# Patient Record
Sex: Female | Born: 1975 | Hispanic: No | Marital: Married | Smoking: Current every day smoker
Health system: Southern US, Community
[De-identification: ages and names within clinical notes are randomized; demographics above are authoritative.]

## PROBLEM LIST (undated history)

## (undated) DIAGNOSIS — J45909 Unspecified asthma, uncomplicated: Secondary | ICD-10-CM

## (undated) DIAGNOSIS — R0600 Dyspnea, unspecified: Secondary | ICD-10-CM

## (undated) DIAGNOSIS — J449 Chronic obstructive pulmonary disease, unspecified: Secondary | ICD-10-CM

## (undated) DIAGNOSIS — R519 Headache, unspecified: Secondary | ICD-10-CM

## (undated) DIAGNOSIS — E079 Disorder of thyroid, unspecified: Secondary | ICD-10-CM

## (undated) DIAGNOSIS — Z800 Family history of malignant neoplasm of digestive organs: Secondary | ICD-10-CM

## (undated) DIAGNOSIS — K219 Gastro-esophageal reflux disease without esophagitis: Secondary | ICD-10-CM

## (undated) DIAGNOSIS — Z803 Family history of malignant neoplasm of breast: Secondary | ICD-10-CM

## (undated) DIAGNOSIS — Z8041 Family history of malignant neoplasm of ovary: Secondary | ICD-10-CM

## (undated) HISTORY — DX: Disorder of thyroid, unspecified: E07.9

## (undated) HISTORY — DX: Family history of malignant neoplasm of breast: Z80.3

## (undated) HISTORY — DX: Family history of malignant neoplasm of ovary: Z80.41

## (undated) HISTORY — DX: Family history of malignant neoplasm of digestive organs: Z80.0

---
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
## 1999-12-27 ENCOUNTER — Inpatient Hospital Stay (HOSPITAL_COMMUNITY): Admission: AD | Admit: 1999-12-27 | Discharge: 1999-12-27 | Payer: Self-pay | Admitting: *Deleted

## 1999-12-28 ENCOUNTER — Encounter: Payer: Self-pay | Admitting: *Deleted

## 2000-05-15 ENCOUNTER — Emergency Department (HOSPITAL_COMMUNITY): Admission: EM | Admit: 2000-05-15 | Discharge: 2000-05-15 | Payer: Self-pay | Admitting: Emergency Medicine

## 2000-10-08 ENCOUNTER — Emergency Department (HOSPITAL_COMMUNITY): Admission: EM | Admit: 2000-10-08 | Discharge: 2000-10-08 | Payer: Self-pay | Admitting: Emergency Medicine

## 2001-02-23 ENCOUNTER — Emergency Department (HOSPITAL_COMMUNITY): Admission: EM | Admit: 2001-02-23 | Discharge: 2001-02-23 | Payer: Self-pay | Admitting: Emergency Medicine

## 2001-02-23 ENCOUNTER — Encounter: Payer: Self-pay | Admitting: Emergency Medicine

## 2002-01-10 ENCOUNTER — Ambulatory Visit (HOSPITAL_COMMUNITY): Admission: RE | Admit: 2002-01-10 | Discharge: 2002-01-10 | Payer: Self-pay | Admitting: *Deleted

## 2002-02-04 ENCOUNTER — Inpatient Hospital Stay (HOSPITAL_COMMUNITY): Admission: AD | Admit: 2002-02-04 | Discharge: 2002-02-04 | Payer: Self-pay | Admitting: *Deleted

## 2002-02-19 ENCOUNTER — Ambulatory Visit (HOSPITAL_COMMUNITY): Admission: RE | Admit: 2002-02-19 | Discharge: 2002-02-19 | Payer: Self-pay | Admitting: *Deleted

## 2002-03-12 ENCOUNTER — Inpatient Hospital Stay (HOSPITAL_COMMUNITY): Admission: AD | Admit: 2002-03-12 | Discharge: 2002-03-12 | Payer: Self-pay | Admitting: Obstetrics and Gynecology

## 2002-03-14 ENCOUNTER — Inpatient Hospital Stay (HOSPITAL_COMMUNITY): Admission: AD | Admit: 2002-03-14 | Discharge: 2002-03-14 | Payer: Self-pay | Admitting: *Deleted

## 2002-04-16 ENCOUNTER — Inpatient Hospital Stay (HOSPITAL_COMMUNITY): Admission: AD | Admit: 2002-04-16 | Discharge: 2002-04-16 | Payer: Self-pay | Admitting: Obstetrics and Gynecology

## 2002-04-18 ENCOUNTER — Inpatient Hospital Stay (HOSPITAL_COMMUNITY): Admission: AD | Admit: 2002-04-18 | Discharge: 2002-04-21 | Payer: Self-pay | Admitting: *Deleted

## 2003-06-08 ENCOUNTER — Encounter: Payer: Self-pay | Admitting: Emergency Medicine

## 2003-06-08 ENCOUNTER — Emergency Department (HOSPITAL_COMMUNITY): Admission: AD | Admit: 2003-06-08 | Discharge: 2003-06-08 | Payer: Self-pay | Admitting: Emergency Medicine

## 2007-01-28 ENCOUNTER — Ambulatory Visit (HOSPITAL_COMMUNITY): Admission: RE | Admit: 2007-01-28 | Discharge: 2007-01-28 | Payer: Self-pay | Admitting: Obstetrics & Gynecology

## 2007-02-15 ENCOUNTER — Ambulatory Visit (HOSPITAL_COMMUNITY): Admission: RE | Admit: 2007-02-15 | Discharge: 2007-02-15 | Payer: Self-pay | Admitting: Obstetrics & Gynecology

## 2007-05-27 ENCOUNTER — Inpatient Hospital Stay (HOSPITAL_COMMUNITY): Admission: AD | Admit: 2007-05-27 | Discharge: 2007-05-27 | Payer: Self-pay | Admitting: Gynecology

## 2007-06-19 ENCOUNTER — Inpatient Hospital Stay (HOSPITAL_COMMUNITY): Admission: AD | Admit: 2007-06-19 | Discharge: 2007-06-22 | Payer: Self-pay | Admitting: Family Medicine

## 2007-06-19 ENCOUNTER — Ambulatory Visit: Payer: Self-pay | Admitting: Obstetrics and Gynecology

## 2007-06-20 ENCOUNTER — Encounter (INDEPENDENT_AMBULATORY_CARE_PROVIDER_SITE_OTHER): Payer: Self-pay | Admitting: Gynecology

## 2009-02-06 IMAGING — US US OB FOLLOW-UP
1 series · 14 of 18 positions shown · non-contrast
Comparison: none

OBSTETRICAL ULTRASOUND:

 This ultrasound exam was performed in the [HOSPITAL] Ultrasound Department.  The OB US report was generated in the AS system, and faxed to the ordering physician.  This report is also available in [REDACTED] PACS.

[Series 1: us ob re-eval · 14 of 18 slices shown]
[im 1/18]
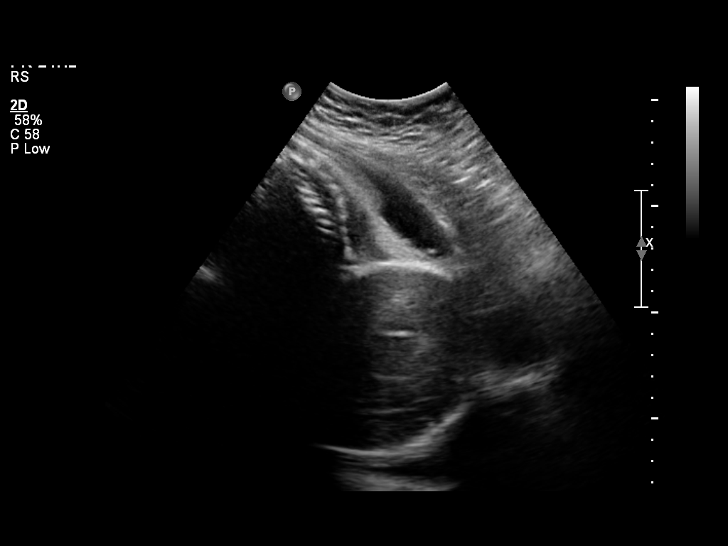
[im 2/18]
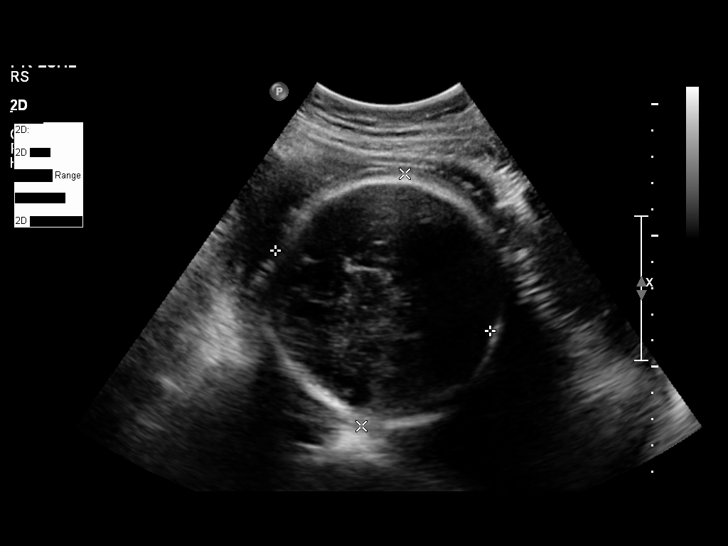
[im 4/18]
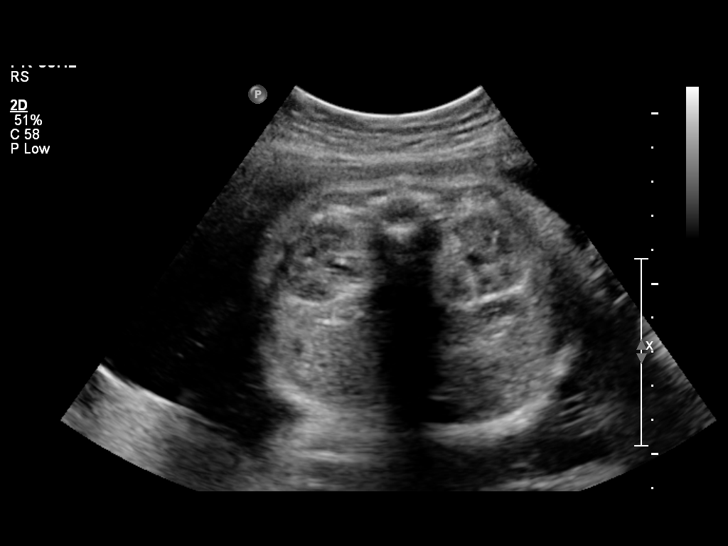
[im 5/18]
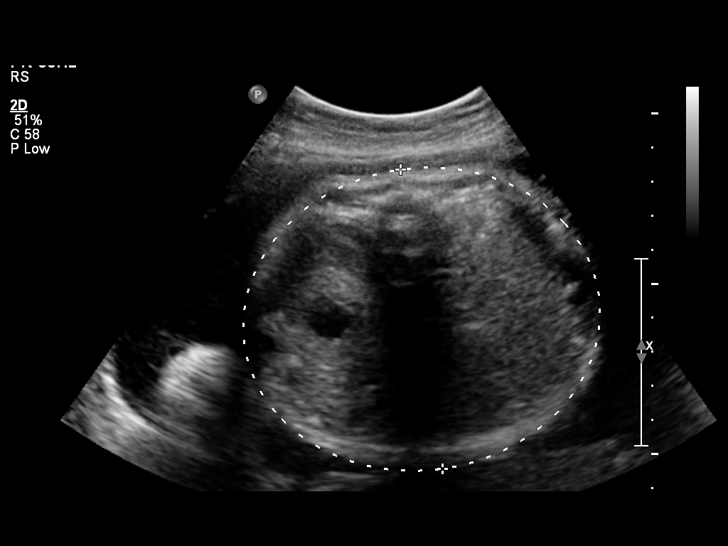
[im 6/18]
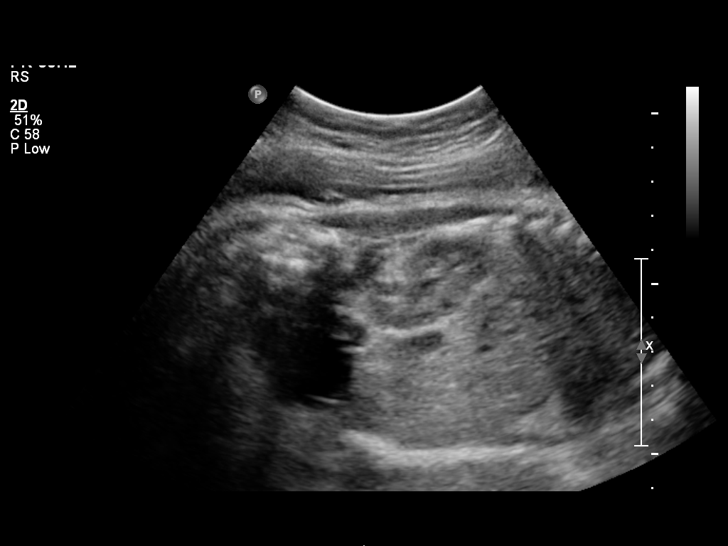
[im 8/18]
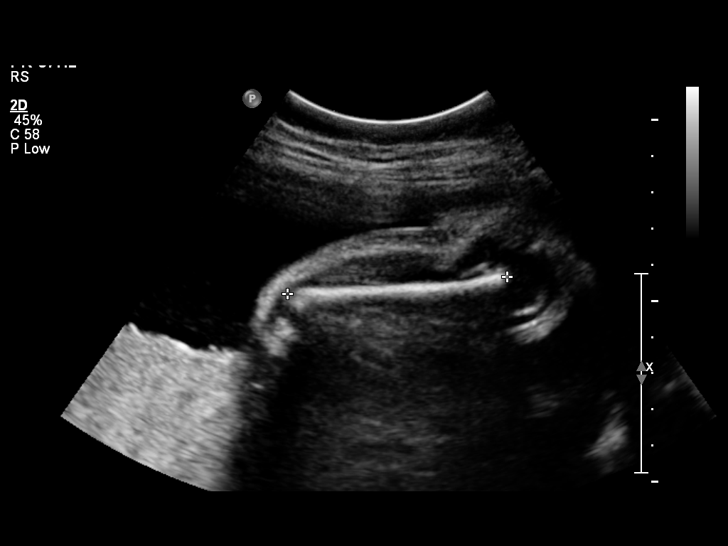
[im 9/18]
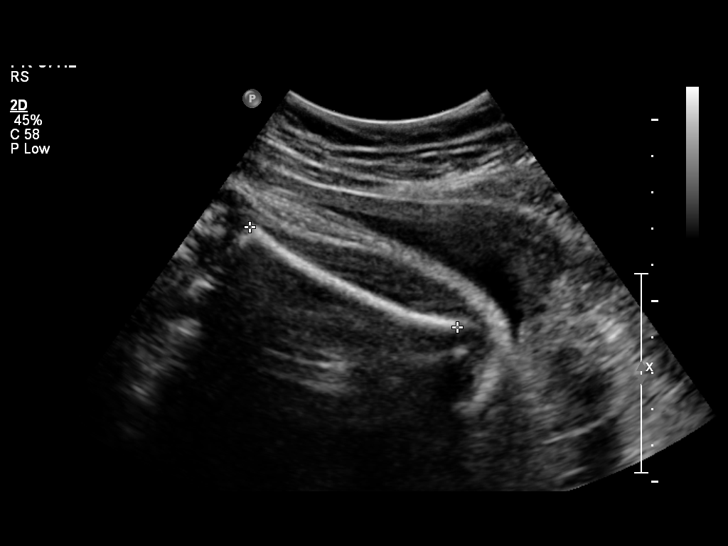
[im 10/18]
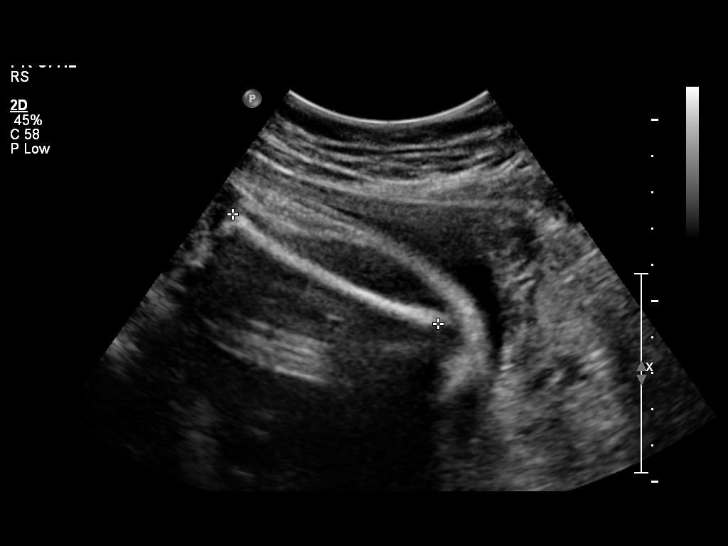
[im 11/18]
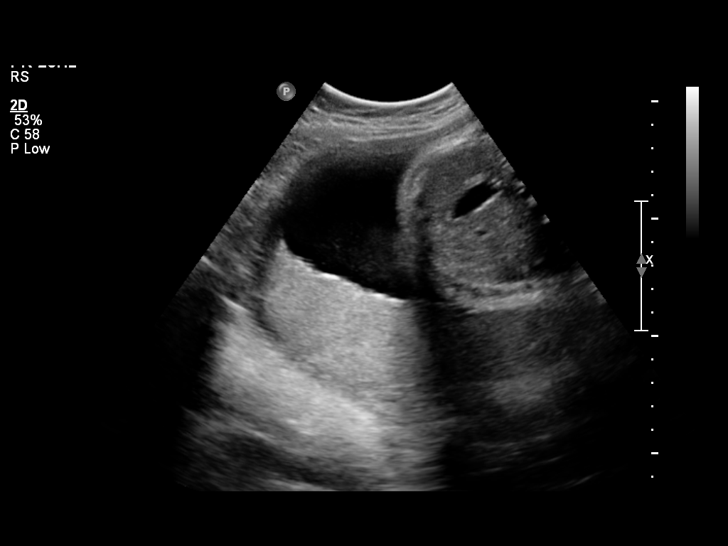
[im 13/18]
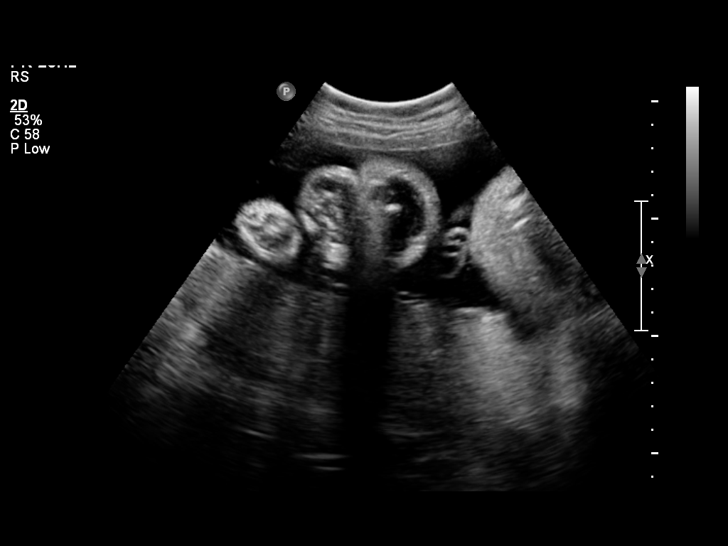
[im 14/18]
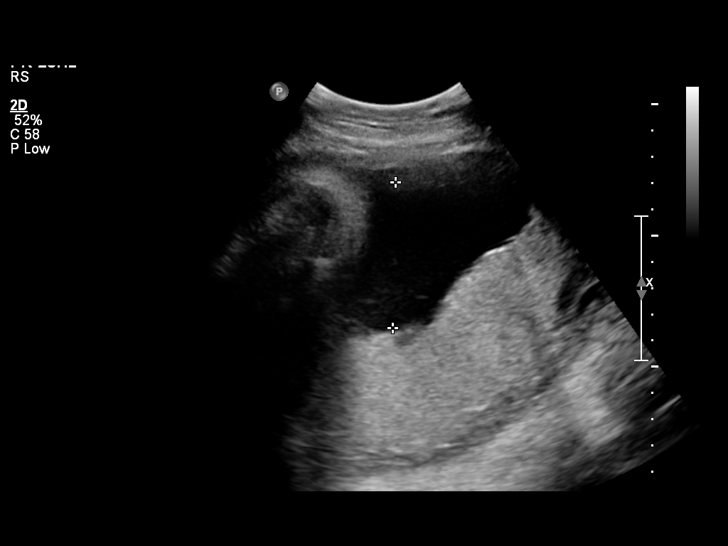
[im 15/18]
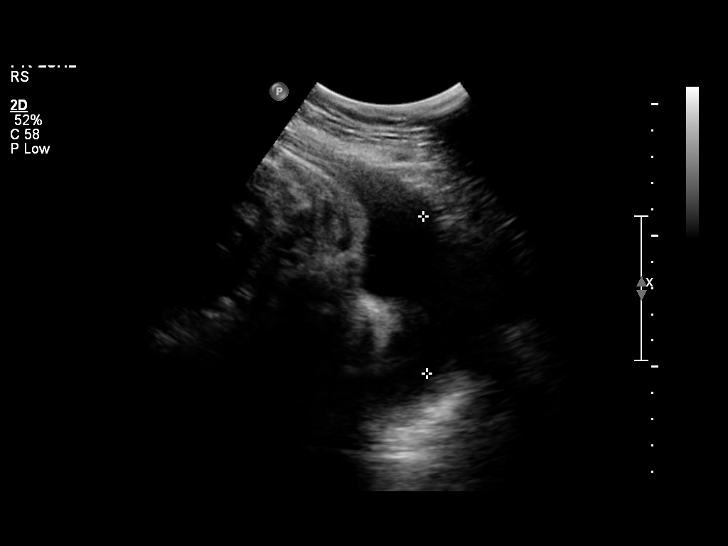
[im 17/18]
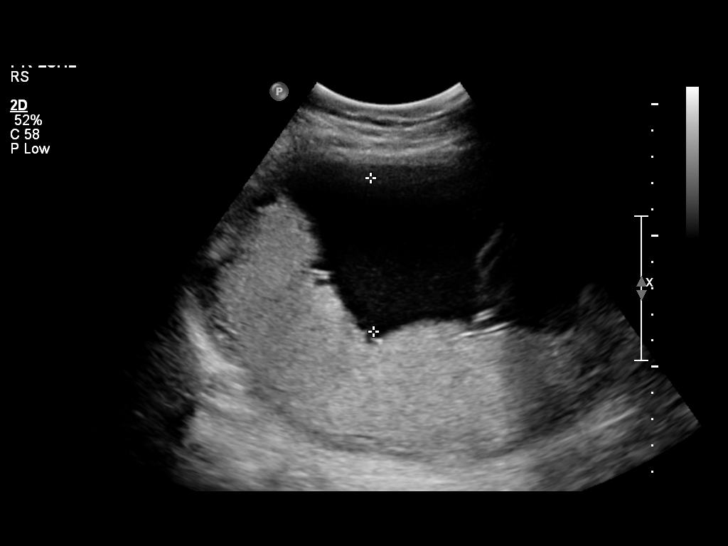
[im 18/18]
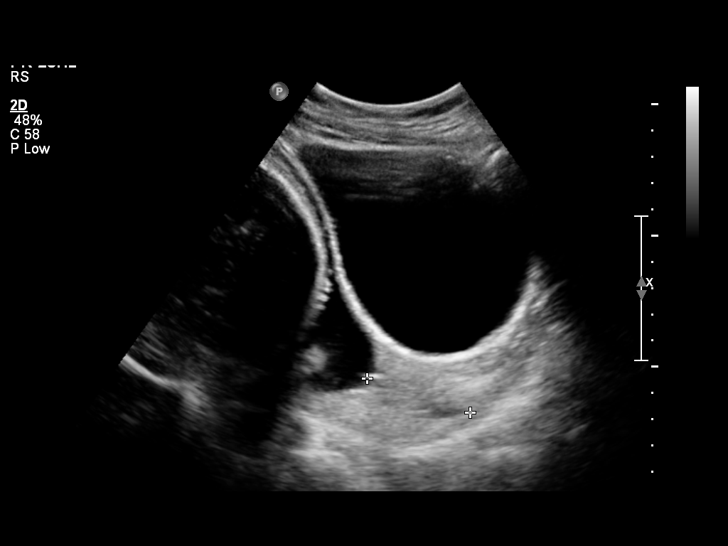

[14 of 18 positions shown; findings below may reference images not displayed]

IMPRESSION: See AS Obstetric US report.

## 2011-05-02 NOTE — Op Note (Signed)
Lindsay Harris, Lindsay Harris              ACCOUNT NO.:  [id number]   MEDICAL RECORD NO.:  [id number]          PATIENT TYPE:  INP   LOCATION:  9173                          FACILITY:  [facility]   PHYSICIAN:  Ginger Carne, MD  DATE OF BIRTH:  [birth year]   DATE OF PROCEDURE:  06/20/2007  DATE OF DISCHARGE:                               OPERATIVE REPORT   PREOPERATIVE DIAGNOSIS:  Request for sterilization postpartum.   POSTOPERATIVE DIAGNOSIS:  Request for sterilization postpartum.   PROCEDURE:  [place] bilateral tubal ligation.   SURGEON:  Ginger Carne, M.D.   ASSISTANT:  None.   COMPLICATIONS:  None immediate.   ESTIMATED BLOOD LOSS:  Minimal.   ANESTHESIA:  Should be epidural.   OPERATIVE FINDINGS:  Uterus, tubes and ovaries showed normal decidual  changes of pregnancy.  Both tubes were identified separate apart from  their respective round ligaments from their isthmus to fimbriated ends.  Both ovaries appeared normal.   OPERATIVE PROCEDURE:  The patient prepped and draped in usual fashion  and placed in the supine position.  Betadine solution was used for  antiseptic and the patient voided immediately prior to the procedure.  After adequate epidural analgesia a small vertical infraumbilical  incision was made and the abdomen opened. As described above both tubes  were identified. At the isthmus ampullary junction 3 cm of tube were  incorporated with 2-0 plain sutures twice, tubes cut above knots.  Specimens sent separately to pathology.  Tips cauterized and no active  bleeding noted. Closure of the fascia in one layer with 0 Vicryl running  suture and 3-0 Monocryl subcuticular closure.  Instrument and sponge  count were correct.  The patient tolerated procedure well, returned to  the post anesthesia recovery room in excellent condition.      Ginger Carne, MD  Electronically Signed     SHB/MEDQ  D:  06/20/2007  T:  06/20/2007  Job:  [id number]

## 2011-10-03 LAB — CBC
HCT: 30.2 — ABNORMAL LOW
MCHC: 33.8
MCV: 96.4
Platelets: 256
Platelets: 311
RBC: 3.75 — ABNORMAL LOW
RDW: 12.7
RDW: 13.1
WBC: 12.4 — ABNORMAL HIGH

## 2011-10-03 LAB — RPR: RPR Ser Ql: NONREACTIVE

## 2011-10-05 LAB — WET PREP, GENITAL: Yeast Wet Prep HPF POC: NONE SEEN

## 2011-10-05 LAB — BASIC METABOLIC PANEL
Calcium: 8.8
Chloride: 107
Creatinine, Ser: 0.49
GFR calc Af Amer: >60
Sodium: 136

## 2011-10-05 LAB — CULTURE, BETA STREP (GROUP B ONLY)

## 2011-10-05 LAB — GC/CHLAMYDIA PROBE AMP, GENITAL: Chlamydia, DNA Probe: NEGATIVE

## 2014-03-30 ENCOUNTER — Emergency Department (HOSPITAL_COMMUNITY)
Admission: EM | Admit: 2014-03-30 | Discharge: 2014-03-30 | Disposition: A | Discharge status: Home / Self Care | Payer: Self-pay | Attending: Emergency Medicine | Admitting: Emergency Medicine

## 2014-03-30 ENCOUNTER — Encounter (HOSPITAL_COMMUNITY): Payer: Self-pay | Admitting: Emergency Medicine

## 2014-03-30 DIAGNOSIS — T148XXA Other injury of unspecified body region, initial encounter: Secondary | ICD-10-CM

## 2014-03-30 DIAGNOSIS — G8929 Other chronic pain: Secondary | ICD-10-CM | POA: Insufficient documentation

## 2014-03-30 DIAGNOSIS — S139XXA Sprain of joints and ligaments of unspecified parts of neck, initial encounter: Secondary | ICD-10-CM | POA: Insufficient documentation

## 2014-03-30 DIAGNOSIS — X500XXA Overexertion from strenuous movement or load, initial encounter: Secondary | ICD-10-CM | POA: Insufficient documentation

## 2014-03-30 DIAGNOSIS — M542 Cervicalgia: Secondary | ICD-10-CM

## 2014-03-30 DIAGNOSIS — Y9389 Activity, other specified: Secondary | ICD-10-CM | POA: Insufficient documentation

## 2014-03-30 DIAGNOSIS — Y929 Unspecified place or not applicable: Secondary | ICD-10-CM | POA: Insufficient documentation

## 2014-03-30 DIAGNOSIS — R209 Unspecified disturbances of skin sensation: Secondary | ICD-10-CM | POA: Insufficient documentation

## 2014-03-30 DIAGNOSIS — Z880 Allergy status to penicillin: Secondary | ICD-10-CM | POA: Insufficient documentation

## 2014-03-30 DIAGNOSIS — Z87891 Personal history of nicotine dependence: Secondary | ICD-10-CM | POA: Insufficient documentation

## 2014-03-30 MED ORDER — DIAZEPAM 5 MG PO TABS
5.0000 mg | ORAL_TABLET | Freq: Once | ORAL | Status: AC
  Administered 2014-03-30: 5 mg via ORAL
  Filled 2014-03-30: qty 1

## 2014-03-30 MED ORDER — DIAZEPAM 5 MG PO TABS: 5.0000 mg | ORAL_TABLET | Freq: Three times a day (TID) | ORAL | Status: DC | PRN

## 2014-03-30 NOTE — ED Notes (Signed)
Pt c/o right sided neck and shoulder pain into upper back after moving heavy furniture; pt sts chronic issues in that area

## 2014-03-30 NOTE — ED Notes (Signed)
Hx of chronic neck pain. Moved furniture this weekend. Now has right neck pain, trapezius pain. States has right arm weakness and right hand numbness.

## 2014-03-30 NOTE — ED Provider Notes (Signed)
CSN: [id number]     Arrival date & time 03/30/14  1312 History  This chart was scribed for non-physician practitioner, Trixie DredgeEmily Diamon Reddinger, PA-C working with Gerhard Munchobert Lockwood, MD by Greggory StallionKayla Andersen, ED scribe. This patient was seen in room TR04C/TR04C and the patient's care was started at 2:50 PM.   Chief Complaint  Patient presents with  . Neck Pain   The history is provided by the patient. No language interpreter was used.   HPI Comments: Lindsay Harris is a 38 y.o. female with history of chronic neck pain who presents to the Emergency Department complaining of sudden onset, constant right sided neck pain that radiates into her shoulder and upper back that started a few days ago. Pt states she was moving heavy furniture and felt a pull and a pop in her neck. Turning her neck to the left worsens the pain. She also has numbness in her right arm. Pt takes eight Advil per day with no relief.   History reviewed. No pertinent past medical history. History reviewed. No pertinent past surgical history. History reviewed. No pertinent family history. History  Substance Use Topics  . Smoking status: Former Games developermoker  . Smokeless tobacco: Not on file  . Alcohol Use: No   OB History   Grav Para Term Preterm Abortions TAB SAB Ect Mult Living                 Review of Systems  Musculoskeletal: Positive for arthralgias, back pain and neck pain.  Neurological: Positive for numbness.  All other systems reviewed and are negative.  Allergies  Amoxicillin  Home Medications  No current outpatient prescriptions on file.  BP 95/55  Pulse 103  Temp(Src) 97.8 F (36.6 C) (Oral)  Resp 20  SpO2 100%  Physical Exam  Nursing note and vitals reviewed. Constitutional: She appears well-developed and well-nourished. No distress.  HENT:  Head: Normocephalic and atraumatic.  Neck: Neck supple.  Pulmonary/Chest: Effort normal.  Musculoskeletal:  Spine nontender, no crepitus, or stepoffs. Right paraspinal  tenderness and right trapezius tenderness and spasm.  Upper extremities:  Strength 5/5, sensation intact, distal pulses intact. Full active ROM of shoulder and elbow.   Neurological: She is alert.  Skin: She is not diaphoretic.    ED Course  Procedures (including critical care time)  DIAGNOSTIC STUDIES: Oxygen Saturation is 100% on RA, normal by my interpretation.    COORDINATION OF CARE: 2:53 PM-Discussed treatment plan which includes a muscle relaxer and continuing ibuprofen with pt at bedside and pt agreed to plan.   Labs Review Labs Reviewed - No data to display Imaging Review No results found.   EKG Interpretation None      MDM   Final diagnoses:  Neck pain on right side  Muscle strain    Afebrile nontoxic patient with acute on chronic neck pain with recent hx heavy lifting.  Neurovascularly intact.  Tenderness over musculature which appears to be in spasm/tight.  Likely muscle strain. D/C home with instructions for cold compresses/ice, valium. PCP follow up.  Discussed findings, treatment, and follow up  with patient.  Pt given return precautions.  Pt verbalizes understanding and agrees with plan.      I personally performed the services described in this documentation, which was scribed in my presence. The recorded information has been reviewed and is accurate.  Trixie Dredgemily Tishawn Friedhoff, PA-C 03/30/14 1625

## 2014-03-30 NOTE — Discharge Instructions (Signed)
Read the information below.  Use the prescribed medication as directed.  Please discuss all new medications with your pharmacist.  You may return to the Emergency Department at any time for worsening condition or any new symptoms that concern you.  If there is any possibility that you might be pregnant, please let your health care provider know and discuss this with the pharmacist to ensure medication safety.   If you develop fevers, loss of control of bowel or bladder, persistent weakness or numbness in your arms or legs, or are unable to walk or hold on to objects, return to the ER for a recheck.    Muscle Strain A muscle strain is an injury that occurs when a muscle is stretched beyond its normal length. Usually a small number of muscle fibers are torn when this happens. Muscle strain is rated in degrees. First-degree strains have the least amount of muscle fiber tearing and pain. Second-degree and third-degree strains have increasingly more tearing and pain.  Usually, recovery from muscle strain takes 1 2 weeks. Complete healing takes 5 6 weeks.  CAUSES  Muscle strain happens when a sudden, violent force placed on a muscle stretches it too far. This may occur with lifting, sports, or a fall.  RISK FACTORS Muscle strain is especially common in athletes.  SIGNS AND SYMPTOMS At the site of the muscle strain, there may be:  Pain.  Bruising.  Swelling.  Difficulty using the muscle due to pain or lack of normal function. DIAGNOSIS  Your health care provider will perform a physical exam and ask about your medical history. TREATMENT  Often, the best treatment for a muscle strain is resting, icing, and applying cold compresses to the injured area.  HOME CARE INSTRUCTIONS   Use the PRICE method of treatment to promote muscle healing during the first 2 3 days after your injury. The PRICE method involves:  Protecting the muscle from being injured again.  Restricting your activity and resting  the injured body part.  Icing your injury. To do this, put ice in a plastic bag. Place a towel between your skin and the bag. Then, apply the ice and leave it on from 15 20 minutes each hour. After the third day, switch to moist heat packs.  Apply compression to the injured area with a splint or elastic bandage. Be careful not to wrap it too tightly. This may interfere with blood circulation or increase swelling.  Elevate the injured body part above the level of your heart as often as you can.  Only take over-the-counter or prescription medicines for pain, discomfort, or fever as directed by your health care provider.  Warming up prior to exercise helps to prevent future muscle strains. SEEK MEDICAL CARE IF:   You have increasing pain or swelling in the injured area.  You have numbness, tingling, or a significant loss of strength in the injured area. MAKE SURE YOU:   Understand these instructions.  Will watch your condition.  Will get help right away if you are not doing well or get worse. Document Released: 12/04/2005 Document Revised: 09/24/2013 Document Reviewed: 07/03/2013 [facility] Patient Information 2014 [place], [place].   Emergency Department Resource Guide 1) Find a Doctor and Pay Out of Pocket Although you won't have to find out who is covered by your insurance plan, it is a good idea to ask around and get recommendations. You will then need to call the office and see if the doctor you have chosen will accept you as  a new patient and what types of options they offer for patients who are self-pay. Some doctors offer discounts or will set up payment plans for their patients who do not have insurance, but you will need to ask so you aren't surprised when you get to your appointment.  2) Contact Your Local Health Department Not all health departments have doctors that can see patients for sick visits, but many do, so it is worth a call to see if yours does. If you don't know  where your local health department is, you can check in your phone book. The CDC also has a tool to help you locate your state's health department, and many state websites also have listings of all of their local health departments.  3) Find a Walk-in Clinic If your illness is not likely to be very severe or complicated, you may want to try a walk in clinic. These are popping up all over the country in pharmacies, drugstores, and shopping centers. They're usually staffed by nurse practitioners or physician assistants that have been trained to treat common illnesses and complaints. They're usually fairly quick and inexpensive. However, if you have serious medical issues or chronic medical problems, these are probably not your best option.  No Primary Care Doctor: - Call Health Connect at  [phone] - they can help you locate a primary care doctor that  accepts your insurance, provides certain services, etc. - Physician Referral Service- [phone]  Chronic Pain Problems: Organization         Address  Phone   Notes  [facility]  [phone]) [phone] Patients need to be referred by their primary care doctor.   Medication Assistance: Organization         Address  Phone   Notes  [facility] Medication [facility] Ptrssistance Program [address] [place], [place] [address] [phone]) [phone] --Must be a resident of [facility] [place] -- Must have NO insurance coverage whatsoever (no Medicaid/ Medicare, etc.) -- The pt. MUST have a primary care doctor that directs their care regularly and follows them in the community   MedAssist  [phone]   [employer]  [phone]) [phone]    Agencies that provide inexpensive medical care: Organization         Address  Phone   Notes  [facility]  [phone]   [facility]    [phone]   [facility] [address] [place], [place] [address] [phone]) [phone]   [facility]  [address], [place] [phone]   [facility]anned Parenthood    [phone](336) [phone]   [facility]    [phone]   [facility] and [facility]  [address], [place] Phone:  [phone]) [phone], Fax:  [phone](336) [phone] Hours of Operation:  9 am - 6 pm, M-F.  Also accepts Medicaid/Medicare and self-pay.  [facility]  [address],  Phone: [phone], Fax: [phone]. Hours of Operation:  8:30 am - 5:30 pm, M-F.  Also accepts Medicaid and self-pay.  [facility] [place] [address], [place] Phone: [phone]) [phone]   [facility] [address], [place], [place] [phone], Ext. [phone] Mondays & Thursdays: 7-9 AM.  First 15 patients are seen on a first come, first serve basis.    Medicaid-accepting [facility] Providers:  Organization  Address  Phone   Notes  [facility] [address], [place] [phone] Also accepts self-pay patients.  [facility] [address], [place]  [phone]) [phone]   [facility] [address], [place] [phone]   [facility] [address] [place] [address], [place] [phone](336) [phone]   Renaye RakersVeita Bland [address], [place]   [phone] Only accepts [place] Access [place]icaid patients after they have their name applied to their card.   Self-Pay (no insurance) in [facility] [place]:  Organization         Address  Phone   Notes  Sickle Cell Patients, [facility] [address], [place] [phone]) [phone]   [facility] [address], [place] [phone]   [facility] [place]  [address], [place] [phone]   [facility]/Dr. Osei-Bonsu  [address] [place] [address], [place] or [id number], [place] [phone] Phone number for both  [place] and [place] locations is the same.  [facility] [address], [place] [phone]   [facility] [place] [address] [place] [address], [place] or [address] [phone]) [phone]   [facility] [address], [place]ony [place] [phone]) [phone], phone; [phone]) [phone], fax Sees patients 1st and 3rd Saturday of every month.  Must not qualify for public or private insurance (i.e. Medicaid, Medicare, [place] Health Choice, Veterans' Benefits)  Household income should be no more than 200% of the poverty level The clinic cannot treat you if you are pregnant or think you are pregnant  Sexually transmitted diseases are not treated at the clinic.    Dental Care: Organization         Address  Phone  Notes  [facility] Department of [facility] [address], [place] [phone]) [phone] Accepts children up to age 37 who are enrolled in [place]icaid or [place] Health Choice; pregnant women with a Medicaid card; and children who have applied for Medicaid or [place] Health Choice, but were declined, whose parents can pay a reduced fee at time of service.  [facility] of [facility] [place]  [address], [place] [phone]) [phone] Accepts children up to age 37 who are enrolled in [place]icaid or [place] Health Choice; pregnant women with a Medicaid card; and children who have applied for Medicaid or [place] Health Choice, but were declined, whose parents can pay a reduced fee at time of service.  [facility]  [address], [place] [phone] Patients are seen by appointment only. Walk-ins are not accepted. [facility] will see patients 37 years of age and older. Monday - Tuesday (8am-5pm) Most Wednesdays (8:30-5pm) $30 per visit, cash only  [facility]  [address], [facility] [place] [facility] [phone]) [phone] Patients are seen by appointment only. Walk-ins are not  accepted. [facility] will see patients 37 years of age and older. One Wednesday Evening (Monthly: Volunteer Based).  $30 per visit, cash only  [employer] [facility] [facility]  [phone]) [phone] for adults; Children under age 214, call Graduate Pediatric Dentistry at [phone]. Children aged 714-14, please call [phone]) [phone] to request a pediatric application.  Dental services are provided in all areas of dental care including  fillings, crowns and bridges, complete and partial dentures, implants, gum treatment, root canals, and extractions. Preventive care is also provided. Treatment is provided to both adults and children. Patients are selected via a lottery and there is often a waiting list.   [facility] [address], [place]  [phone]) [phone] [email or web address]   [facility] [address], [place], [place] [phone], Ext. [phone] Second and Fourth Thursday of each month, opens at 6:30 AM; Clinic ends at 9 AM.  Patients are seen on a first-come first-served basis, and a limited number are seen during each clinic.   [facility]  [address], [place], [place] [phone]) [phone]   Eligibility Requirements You must have lived in [place], [place], or [place] counties for at least the last three months.   You cannot be eligible for state or federal sponsored National Cityhealthcare insurance, including CIGNAVeterans Administration, [place]icaid, or Har[employer] EntertainmentMedicare.   You generally cannot be eligible for healthcare insurance through your employer.    How to apply: Eligibility screenings are held every Tuesday and Wednesday afternoon from 1:00 pm until 4:00 pm. You do not need an appointment for the interview!  [facility] [address], [place], [place] [phone]   [facility]  [phone]   [facility]  [phone]   [facility]  [phone]    Behavioral Health Resources in the  Community: Intensive Outpatient Programs Organization         Address  Phone  Notes  [facility] [address], [place], [place] [phone]   [facility] [address], [place], [place] [phone]   [facility] S[facility] [address], [place], [place]  [phone]   [facility] [address],  [place], [place] [phone] or [phone]   Substance Abuse Resources Organization         Address  Phone  Notes  [facility] Drug Services  [phone]   [facility]  [phone]   [facility]  [phone]   [facility]  [phone]   Residential & Outpatient Substance Abuse Program  [phone]   Psychological Services Organization         Address  Phone  Notes  [facility]  [phone]   [facility]  [phone]- [phone]   [facility] [address], [place] [phone] or [phone]    Mobile Crisis Teams Organization         Address  Phone  Notes  [facility], Mobile Crisis Care Unit  [phone]   [facility]  [address] [place], [place] [phone]   Doristine LocksSharon DeEsch [address] [place] [phone]    Self-Help/Support Groups Organization         Address  Phone             Notes  [facility] Assoc[facility] of [place] - variety of support groups  336- [phone] Call for more information  Narcotics Anonymous (NA), [facility]aring [facility] [address], [place]  2 meetings at this location   Statisticianesidential Treatment Programs Organization         Address  Phone  Notes  [facility] [address],    [place]  [phone]   [facility]  [address], [place] [id number], [place], [place] [phone]   [facility] [address], [place] [phone] Admissions: 8am-3pm M-F  [facility] [address],    [place], [place] [phone]   [facility] [address] [place], [place], [place] [phone]   [facility] [address],  [place], [place] [phone]   [facility] - Intensive Outpatient [address], [place], [place] [phone]   [facility] ([facility] [address]  [place], [place] [phone] or [phone]   [facility] (RTS) [address] [place], [place] [phone] Accepts Medicaid  [facility]olson [address],  [place] [phone] Substance Abuse/Addiction Treatment   [facility] Organization         Address  Phone  Notes  [facility]  [phone]   Angie Fava, PhD [address] Knack [place], [place]   [phone] or [phone]   [facility]   [address] [place], [place] [phone]   [facility] [address], [place], [place] [phone] Insurance/Medicaid/sponsorship through [facility] [place] [facility] [address]                                    [place], [place] [phone] Therapy/tele-psych/case  [facility] [address], [place] [phone]    Dr. Lolly Mustache  [phone]   [facility] [place]  [employer] [facility] Dept. 1) [address] [place] 2) [address] [place] 3)  [address], [place] [phone]  [phone]   [facility] Child Abuse Hotline (830)[phone] or [phone] (After Hours)

## 2014-04-02 NOTE — ED Provider Notes (Signed)
Medical screening examination/treatment/procedure(s) were performed by non-physician practitioner and as supervising physician I was immediately available for consultation/collaboration.  Kevionna Heffler, MD 04/02/14 0721 

## 2015-12-30 ENCOUNTER — Emergency Department (HOSPITAL_COMMUNITY)
Admission: EM | Admit: 2015-12-30 | Discharge: 2015-12-31 | Disposition: A | Discharge status: Home / Self Care | Payer: Self-pay | Attending: Emergency Medicine | Admitting: Emergency Medicine

## 2015-12-30 ENCOUNTER — Encounter (HOSPITAL_COMMUNITY): Payer: Self-pay | Admitting: Emergency Medicine

## 2015-12-30 DIAGNOSIS — L03211 Cellulitis of face: Secondary | ICD-10-CM | POA: Insufficient documentation

## 2015-12-30 DIAGNOSIS — K047 Periapical abscess without sinus: Secondary | ICD-10-CM | POA: Insufficient documentation

## 2015-12-30 DIAGNOSIS — L0291 Cutaneous abscess, unspecified: Secondary | ICD-10-CM

## 2015-12-30 DIAGNOSIS — Z3202 Encounter for pregnancy test, result negative: Secondary | ICD-10-CM | POA: Insufficient documentation

## 2015-12-30 DIAGNOSIS — Z880 Allergy status to penicillin: Secondary | ICD-10-CM | POA: Insufficient documentation

## 2015-12-30 DIAGNOSIS — F17210 Nicotine dependence, cigarettes, uncomplicated: Secondary | ICD-10-CM | POA: Insufficient documentation

## 2015-12-30 LAB — COMPREHENSIVE METABOLIC PANEL
ALT: 13 U/L — ABNORMAL LOW (ref 14–54)
ANION GAP: 11 (ref 5–15)
AST: 18 U/L (ref 15–41)
Albumin: 3.8 g/dL (ref 3.5–5.0)
Alkaline Phosphatase: 66 U/L (ref 38–126)
BILIRUBIN TOTAL: 0.6 mg/dL (ref 0.3–1.2)
BUN: <5 mg/dL — ABNORMAL LOW (ref 6–20)
CO2: 25 mmol/L (ref 22–32)
Calcium: 9.3 mg/dL (ref 8.9–10.3)
Chloride: 103 mmol/L (ref 101–111)
Creatinine, Ser: 0.84 mg/dL (ref 0.44–1.00)
GFR calc Af Amer: >60 mL/min (ref >60)
Glucose, Bld: 114 mg/dL — ABNORMAL HIGH (ref 65–99)
POTASSIUM: 3.6 mmol/L (ref 3.5–5.1)
Sodium: 139 mmol/L (ref 135–145)
TOTAL PROTEIN: 7.5 g/dL (ref 6.5–8.1)

## 2015-12-30 LAB — CBC
HEMATOCRIT: 39.5 % (ref 36.0–46.0)
HEMOGLOBIN: 13.6 g/dL (ref 12.0–15.0)
MCH: 33 pg (ref 26.0–34.0)
MCHC: 34.4 g/dL (ref 30.0–36.0)
MCV: 95.9 fL (ref 78.0–100.0)
Platelets: 229 10*3/uL (ref 150–400)
RBC: 4.12 MIL/uL (ref 3.87–5.11)
RDW: 12.5 % (ref 11.5–15.5)
WBC: 15.3 10*3/uL — AB (ref 4.0–10.5)

## 2015-12-30 LAB — I-STAT CG4 LACTIC ACID, ED
LACTIC ACID, VENOUS: 0.33 mmol/L — AB (ref 0.5–2.0)
Lactic Acid, Venous: 1.33 mmol/L (ref 0.5–2.0)

## 2015-12-30 MED ORDER — MORPHINE SULFATE (PF) 4 MG/ML IV SOLN
4.0000 mg | Freq: Once | INTRAVENOUS | Status: AC
  Administered 2015-12-30: 4 mg via INTRAVENOUS
  Filled 2015-12-30: qty 1

## 2015-12-30 NOTE — ED Notes (Signed)
ED PA at the bedside 

## 2015-12-30 NOTE — ED Provider Notes (Signed)
CSN: [id number]     Arrival date & time 12/30/15  1735 History   First MD Initiated Contact with Patient 12/30/15 2224     Chief Complaint  Patient presents with  . Facial Swelling     (Consider location/radiation/quality/duration/timing/severity/associated sxs/prior Treatment) The history is provided by the patient and medical records.    40 year old female presenting to the ED for left-sided facial swelling. Patient states this was sudden onset upon waking this morning. She states initially she had mild swelling beneath her left eye, then it has migrated to the left cheek and across to the right side of her face.  Patient states she had a low-grade fever earlier today. She denies any chills. She states her face is very painful. She denies any dental pain. Patient states she saw she may have had an early dental abscess, however she called her dentist and he felt this was less likely so he encouraged her to come to the emergency department.  Patient has no known allergies. No new foods, medications, soaps, detergents, makeup, or other personal care products. She denies any difficulty swallowing. No chest pain or shortness of breath.  History reviewed. No pertinent past medical history. History reviewed. No pertinent past surgical history. No family history on file. Social History  Substance Use Topics  . Smoking status: Current Every Day Smoker -- 0.50 packs/day    Types: Cigarettes  . Smokeless tobacco: None  . Alcohol Use: No   OB History    No data available     Review of Systems  HENT: Positive for facial swelling.   All other systems reviewed and are negative.     Allergies  Amoxicillin  Home Medications   Prior to Admission medications   Medication Sig Start Date End Date Taking? Authorizing Provider  Acetaminophen-Caff-Pyrilamine (MIDOL COMPLETE PO) Take 2 tablets by mouth daily as needed (for menstrual cramping).    Historical Provider, MD  diazepam (VALIUM) 5 MG  tablet Take 1 tablet (5 mg total) by mouth every 8 (eight) hours as needed (muscle spasm or pain). 03/30/14   Trixie Dredge, PA-C   BP 115/67 mmHg  Pulse 94  Temp(Src) 98.2 F (36.8 C) (Oral)  Resp 18  Ht 5\' 2"  (1.575 m)  Wt 60.328 kg  BMI 24.32 kg/m2  SpO2 99%  LMP 12/27/2015   Physical Exam  Constitutional: She is oriented to person, place, and time. She appears well-developed and well-nourished. No distress.  HENT:  Head: Normocephalic and atraumatic.    Mouth/Throat: Oropharynx is clear and moist.  Left facial swelling as depicted; swelling begins just inferior to left eye and extends across bridge of nose towards right cheek; no directed eyelid involvement; no skin discoloration, rash, open wounds, or obvious induration/cellulitis  Teeth largely in good dentition, left upper molars are non-tender, no significant decay or fractured teeth, surrounding gingiva normal in appearance without swelling, discoloration, or tenderness, handling secretions appropriately, no trismus  Eyes: Conjunctivae and EOM are normal. Pupils are equal, round, and reactive to light.  Neck: Normal range of motion. Neck supple.  Cardiovascular: Normal rate, regular rhythm and normal heart sounds.   Pulmonary/Chest: Effort normal and breath sounds normal. No respiratory distress. She has no wheezes.  Abdominal: Soft. Bowel sounds are normal. There is no tenderness. There is no guarding.  Musculoskeletal: Normal range of motion.  Neurological: She is alert and oriented to person, place, and time.  Skin: Skin is warm and dry. She is not diaphoretic.  Psychiatric: She has  a normal mood and affect.  Nursing note and vitals reviewed.   ED Course  Procedures (including critical care time) Labs Review Labs Reviewed  COMPREHENSIVE METABOLIC PANEL - Abnormal; Notable for the following:    Glucose, Bld 114 (*)    BUN <5 (*)    ALT 13 (*)    All other components within normal limits  CBC - Abnormal; Notable for  the following:    WBC 15.3 (*)    All other components within normal limits  I-STAT CG4 LACTIC ACID, ED - Abnormal; Notable for the following:    Lactic Acid, Venous 0.33 (*)    All other components within normal limits  I-STAT CG4 LACTIC ACID, ED  POC URINE PREG, ED    Imaging Review Ct Maxillofacial W/cm  12/31/2015  CLINICAL DATA:  Left facial swelling.  No dental pain. EXAM: CT MAXILLOFACIAL WITH CONTRAST TECHNIQUE: Multidetector CT imaging of the maxillofacial structures was performed with intravenous contrast. Multiplanar CT image reconstructions were also generated. A small metallic BB was placed on the right temple in order to reliably differentiate right from left. CONTRAST:  80mL OMNIPAQUE IOHEXOL 300 MG/ML  SOLN COMPARISON:  None. FINDINGS: Left facial soft tissue edema and mild skin thickening. There is a small crescentic fluid collection about the left alveolar ridge measuring 2 x 12 mm. No soft tissue air. No subjacent bony abnormality. Paranasal sinuses are well-aerated. No mucosal thickening or fluid level. Mastoid air cells are clear. There is no facial bone fracture. Orbits and globes appear normal, no retrobulbar edema. There are broken teeth and dental caries, no periapical lucencies. Lip ring is noted. IMPRESSION: Left facial soft tissue edema, may reflect cellulitis. Small question take 2 x 12 mm fluid collection about the left alveolar ridge may reflect small abscess. Electronically Signed   By: Rubye OaksMelanie  Ehinger M.D.   On: 12/31/2015 02:13   I have personally reviewed and evaluated these images and lab results as part of my medical decision-making.   EKG Interpretation None      MDM   Final diagnoses:  Facial cellulitis  Abscess   40 year old female here with left facial swelling. Patient is afebrile, nontoxic. She does have some swelling inferior to left eye but is not extending to mouth. She has no overlying skin changes visible. No rashes or lesions. She has no  dental pain, teeth are largely in very good dentition.  Lab work does reveal leukocytosis. CT of her face was obtained which reveals possible cellulitis and questionable abscess of the left alveolar ridge. Results were discussed with patient, she acknowledged understanding. Will start on clindamycin given her penicillin allergy. She will follow-up with her dentist as soon as possible for recheck.  Discussed plan with patient, he/she acknowledged understanding and agreed with plan of care.  Return precautions given for new or worsening symptoms.  Case discussed with attending physician, Dr. Nicanor AlconPalumbo, who reviewed CT scan and agrees with assessment and plan of care.  Garlon HatchetLisa M Sanders, PA-C 12/31/15 [phone]  April Palumbo, MD 12/31/15 [phone]

## 2015-12-30 NOTE — ED Notes (Signed)
Pt states she woke up this monring and had some swelling on the left side of her nose and has spread up her face under eye and around nose.

## 2015-12-31 ENCOUNTER — Emergency Department (HOSPITAL_COMMUNITY): Payer: Self-pay

## 2015-12-31 LAB — POC URINE PREG, ED: Preg Test, Ur: NEGATIVE

## 2015-12-31 MED ORDER — CLINDAMYCIN HCL 150 MG PO CAPS: 300.0000 mg | ORAL_CAPSULE | Freq: Three times a day (TID) | ORAL | Status: DC

## 2015-12-31 MED ORDER — IOHEXOL 300 MG/ML  SOLN
80.0000 mL | Freq: Once | INTRAMUSCULAR | Status: AC | PRN
  Administered 2015-12-31: 80 mL via INTRAVENOUS

## 2015-12-31 MED ORDER — HYDROCODONE-ACETAMINOPHEN 5-325 MG PO TABS: 1.0000 | ORAL_TABLET | ORAL | Status: DC | PRN

## 2015-12-31 NOTE — ED Notes (Signed)
Patient transported to CT 

## 2015-12-31 NOTE — Discharge Instructions (Signed)
Take the prescribed medication as directed.  vicodin can make you sleepy/drowsy so use caution. Follow-up with your dentist as soon as possible. Return to the ED for new or worsening symptoms.

## 2018-09-19 ENCOUNTER — Other Ambulatory Visit: Payer: Self-pay

## 2018-09-19 ENCOUNTER — Ambulatory Visit (HOSPITAL_COMMUNITY)
Admission: EM | Admit: 2018-09-19 | Discharge: 2018-09-19 | Disposition: A | Discharge status: Home / Self Care | Payer: Self-pay | Attending: Emergency Medicine | Admitting: Emergency Medicine

## 2018-09-19 DIAGNOSIS — F17210 Nicotine dependence, cigarettes, uncomplicated: Secondary | ICD-10-CM | POA: Insufficient documentation

## 2018-09-19 DIAGNOSIS — M549 Dorsalgia, unspecified: Secondary | ICD-10-CM | POA: Insufficient documentation

## 2018-09-19 DIAGNOSIS — Z880 Allergy status to penicillin: Secondary | ICD-10-CM | POA: Insufficient documentation

## 2018-09-19 DIAGNOSIS — R112 Nausea with vomiting, unspecified: Secondary | ICD-10-CM | POA: Insufficient documentation

## 2018-09-19 DIAGNOSIS — N10 Acute tubulo-interstitial nephritis: Secondary | ICD-10-CM | POA: Insufficient documentation

## 2018-09-19 DIAGNOSIS — Z3202 Encounter for pregnancy test, result negative: Secondary | ICD-10-CM

## 2018-09-19 LAB — POCT URINALYSIS DIP (DEVICE)
Bilirubin Urine: NEGATIVE
Glucose, UA: NEGATIVE mg/dL
Ketones, ur: NEGATIVE mg/dL
NITRITE: POSITIVE — AB
PROTEIN: 100 mg/dL — AB
Specific Gravity, Urine: 1.02 (ref 1.005–1.030)
Urobilinogen, UA: 0.2 mg/dL (ref 0.0–1.0)
pH: 6 (ref 5.0–8.0)

## 2018-09-19 LAB — POCT I-STAT, CHEM 8
BUN: 7 mg/dL (ref 6–20)
CREATININE: 0.8 mg/dL (ref 0.44–1.00)
Calcium, Ion: 1.06 mmol/L — ABNORMAL LOW (ref 1.15–1.40)
Chloride: 103 mmol/L (ref 98–111)
GLUCOSE: 129 mg/dL — AB (ref 70–99)
HCT: 45 % (ref 36.0–46.0)
Hemoglobin: 15.3 g/dL — ABNORMAL HIGH (ref 12.0–15.0)
POTASSIUM: 3.4 mmol/L — AB (ref 3.5–5.1)
Sodium: 135 mmol/L (ref 135–145)
TCO2: 20 mmol/L — ABNORMAL LOW (ref 22–32)

## 2018-09-19 LAB — POCT PREGNANCY, URINE: Preg Test, Ur: NEGATIVE

## 2018-09-19 MED ORDER — KETOROLAC TROMETHAMINE 30 MG/ML IJ SOLN
INTRAMUSCULAR | Status: AC
  Filled 2018-09-19: qty 1

## 2018-09-19 MED ORDER — KETOROLAC TROMETHAMINE 30 MG/ML IJ SOLN
30.0000 mg | Freq: Once | INTRAMUSCULAR | Status: AC
  Administered 2018-09-19: 30 mg via INTRAMUSCULAR

## 2018-09-19 MED ORDER — ONDANSETRON 4 MG PO TBDP: 4.0000 mg | ORAL_TABLET | Freq: Three times a day (TID) | ORAL | 0 refills | Status: DC | PRN

## 2018-09-19 MED ORDER — SODIUM CHLORIDE 0.9 % IV BOLUS
1000.0000 mL | Freq: Once | INTRAVENOUS | Status: AC
  Administered 2018-09-19: 1000 mL via INTRAVENOUS

## 2018-09-19 MED ORDER — SULFAMETHOXAZOLE-TRIMETHOPRIM 800-160 MG PO TABS: 1.0000 | ORAL_TABLET | Freq: Two times a day (BID) | ORAL | 0 refills | Status: AC

## 2018-09-19 MED ORDER — ONDANSETRON HCL 4 MG/2ML IJ SOLN
INTRAMUSCULAR | Status: AC
  Filled 2018-09-19: qty 2

## 2018-09-19 MED ORDER — ONDANSETRON HCL 4 MG/2ML IJ SOLN
4.0000 mg | Freq: Once | INTRAMUSCULAR | Status: AC
  Administered 2018-09-19: 4 mg via INTRAVENOUS

## 2018-09-19 NOTE — ED Provider Notes (Signed)
[facility]    CSN: [id number] Arrival date & time: 09/19/18  0848     History   Chief Complaint Chief Complaint  Patient presents with  . Emesis  . Back Pain    HPI Lindsay Harris is a 42 y.o. female.   Lindsay Harris presents with complaints of vomiting and back pain which started two days ago. Persistent heaving. Abdominal pain and cramping. Mild dizziness. Diarrhea yesterday. No known fevers. No known ill contacts. States she does have some pain after urination as well as left sided back pain. No vaginal symptoms. Denies any previous similar. Has been taking allergy medication but still feels sinus pressure. Has been taking cranberry pills which have not helped. Headache. Smokes less than 1 ppd. Smokes 1gm of marijuana daily. No known questionable/contaminated intake prior to onset of symptoms.    ROS per HPI.      No past medical history on file.  There are no active problems to display for this patient.   No past surgical history on file.  OB History   None      Home Medications    Prior to Admission medications   Medication Sig Start Date End Date Taking? Authorizing Provider  clindamycin (CLEOCIN) 150 MG capsule Take 2 capsules (300 mg total) by mouth 3 (three) times daily. May dispense as 150mg  capsules 12/31/15   Garlon Hatchet, PA-C  diazepam (VALIUM) 5 MG tablet Take 1 tablet (5 mg total) by mouth every 8 (eight) hours as needed (muscle spasm or pain). Patient not taking: Reported on 12/30/2015 03/30/14   Trixie Dredge, PA-C  HYDROcodone-acetaminophen (NORCO/VICODIN) 5-325 MG tablet Take 1 tablet by mouth every 4 (four) hours as needed. Patient not taking: Reported on 09/19/2018 12/31/15   Garlon Hatchet, PA-C  ibuprofen (ADVIL,MOTRIN) 200 MG tablet Take 200 mg by mouth every 6 (six) hours as needed for moderate pain.     [provider]    Family History No family history on file.  Social History Social History   Tobacco Use  .  Smoking status: Current Every Day Smoker    Packs/day: 0.50    Types: Cigarettes  Substance Use Topics  . Alcohol use: No  . Drug use: No     Allergies   Amoxicillin   Review of Systems Review of Systems   Physical Exam Triage Vital Signs ED Triage Vitals  Enc Vitals Group     BP 09/19/18 0856 (!) 87/41     Pulse Rate 09/19/18 0902 (!) 115     Resp 09/19/18 0856 16     Temp 09/19/18 0856 98.5 F (36.9 C)     Temp src --      SpO2 09/19/18 0856 98 %     Weight 09/19/18 0859 153 lb (69.4 kg)     Height --      Head Circumference --      Peak Flow --      Pain Score --      Pain Loc --      Pain Edu? --      Excl. in GC? --    No data found.  Updated Vital Signs BP (!) 89/58 (BP Location: Left Arm)   Pulse 98   Temp 98 F (36.7 C) (Oral)   Resp 20   Wt 153 lb (69.4 kg)   LMP 08/29/2018   SpO2 97%   BMI 27.98 kg/m    Physical Exam  Constitutional: She is oriented to  person, place, and time. She appears well-developed and well-nourished. No distress.  Cardiovascular: Regular rhythm and normal heart sounds. Tachycardia present.  Pulmonary/Chest: Effort normal and breath sounds normal.  Abdominal: Soft. Bowel sounds are decreased. There is tenderness. There is CVA tenderness. There is no rigidity, no rebound, no guarding, no tenderness at McBurney's point and negative Murphy's sign.  Constant dry heaving on initial exam; improved with IV zofran; generalized abdominal pain on palpation, upper abdomen greatest; left cva tenderness on percussion   Neurological: She is alert and oriented to person, place, and time.  Skin: Skin is warm and dry.     UC Treatments / Results  Labs (all labs ordered are listed, but only abnormal results are displayed) Labs Reviewed  POCT I-STAT, CHEM 8 - Abnormal; Notable for the following components:      Result Value   Potassium 3.4 (*)    Glucose, Bld 129 (*)    Calcium, Ion 1.06 (*)    TCO2 20 (*)    Hemoglobin 15.3 (*)      All other components within normal limits  URINE CULTURE  POCT PREGNANCY, URINE    EKG None  Radiology No results found.  Procedures Procedures (including critical care time)  Medications Ordered in UC Medications  ondansetron (ZOFRAN) injection 4 mg (4 mg Intravenous Given 09/19/18 0917)  sodium chloride 0.9 % bolus 1,000 mL (0 mLs Intravenous Stopped 09/19/18 1026)  ketorolac (TORADOL) 30 MG/ML injection 30 mg (30 mg Intramuscular Given 09/19/18 0920)    Initial Impression / Assessment and Plan / UC Course  I have reviewed the triage vital signs and the nursing notes.  Pertinent labs & imaging results that were available during my care of the patient were reviewed by me and considered in my medical decision making (see chart for details).     2130: heaving constantly on initial presentation. Abdominal cramping without specific pain at this time, afebrile. Will start fluids and zofran and reassess.   0945: no longer heaving, nausea improved. Still with abdominal discomfort. Exam completed, unable to urinate at this time.   1035: patient feels much improved. No longer vomiting. Pain improved. Sitting upright, appears much more comfortable    Urine, history and physical consistent with pyelonephritis. Anaphylaxis to amoxicillin therefore unable to provide rocephin in clinic. zofran PRN and course of bactrim provided. Culture pending. BP remains lower here today, but per history review patient runs in low 100's. HR improved with fluids. Now able to tolerate PO as well. Encouraged to push fluids. Return precautions provided. Patient verbalized understanding and agreeable to plan.   Final Clinical Impressions(s) / UC Diagnoses   Final diagnoses:  Acute pyelonephritis  Non-intractable vomiting with nausea, unspecified vomiting type     Discharge Instructions     Drink plenty of water to empty bladder regularly. Please increase your fluid intake today as your blood pressure  remains on the lower end of normal for you.  Bland diet as tolerated.  Avoid alcohol and caffeine as these may irritate the bladder.   Complete course of antibiotics.  Zofran as needed for nausea.  If develop worsening of symptoms, increased pain, uncontrolled nausea/vomiting, dehydration, no urine output in 8-10 hours, fevers, or otherwise worsening please go to the Er.     ED Prescriptions    None     Controlled Substance Prescriptions  Controlled Substance Registry consulted? Not Applicable   Georgetta Haber, NP 09/19/18 1053

## 2018-09-19 NOTE — Discharge Instructions (Addendum)
Drink plenty of water to empty bladder regularly. Please increase your fluid intake today as your blood pressure remains on the lower end of normal for you.  Bland diet as tolerated.  Avoid alcohol and caffeine as these may irritate the bladder.   Complete course of antibiotics.  Zofran as needed for nausea.  If develop worsening of symptoms, increased pain, uncontrolled nausea/vomiting, dehydration, no urine output in 8-10 hours, fevers, or otherwise worsening please go to the Er.

## 2018-09-19 NOTE — ED Triage Notes (Signed)
Pt states she's having back pain and vomiting 2 days.

## 2018-09-21 LAB — URINE CULTURE: Culture: >=100000 — AB

## 2018-09-23 ENCOUNTER — Telehealth (HOSPITAL_COMMUNITY): Payer: Self-pay

## 2018-09-23 NOTE — Telephone Encounter (Signed)
Urine culture positive for E.coli. This was treated with Bactrim. Attempted to reach patient. No answer at this time

## 2019-07-14 ENCOUNTER — Ambulatory Visit (HOSPITAL_COMMUNITY)
Admission: EM | Admit: 2019-07-14 | Discharge: 2019-07-14 | Disposition: A | Discharge status: Home / Self Care | Payer: HRSA Program | Attending: Emergency Medicine | Admitting: Emergency Medicine

## 2019-07-14 ENCOUNTER — Other Ambulatory Visit: Payer: Self-pay

## 2019-07-14 DIAGNOSIS — Z20828 Contact with and (suspected) exposure to other viral communicable diseases: Secondary | ICD-10-CM | POA: Diagnosis not present

## 2019-07-14 DIAGNOSIS — F17210 Nicotine dependence, cigarettes, uncomplicated: Secondary | ICD-10-CM | POA: Diagnosis not present

## 2019-07-14 DIAGNOSIS — Z20822 Contact with and (suspected) exposure to covid-19: Secondary | ICD-10-CM

## 2019-07-14 DIAGNOSIS — Z880 Allergy status to penicillin: Secondary | ICD-10-CM | POA: Diagnosis not present

## 2019-07-14 NOTE — ED Triage Notes (Signed)
Pt here requesting covid testing after possibly being exposed at her job; pt sts some sore throat

## 2019-07-14 NOTE — ED Provider Notes (Signed)
[facility]    CSN: [id number] Arrival date & time: 07/14/19  1555     History   Chief Complaint Chief Complaint  Patient presents with  . covid testing    HPI Lindsay Harris is a 43 y.o. female.   Patient request COVID testing.  She states she was in contact with a COVID positive customer who was coughing and symptomatic.  She denies fever, chills, cough, shortness of breath, nausea, vomiting, diarrhea, or other symptoms.  She states she was sent here by her work to obtain a COVID test.    The history is provided by the patient.    No past medical history on file.  There are no active problems to display for this patient.   No past surgical history on file.  OB History   No obstetric history on file.      Home Medications    Prior to Admission medications   Medication Sig Start Date End Date Taking? Authorizing Provider  clindamycin (CLEOCIN) 150 MG capsule Take 2 capsules (300 mg total) by mouth 3 (three) times daily. May dispense as 150mg  capsules 12/31/15   Larene Pickett, PA-C  diazepam (VALIUM) 5 MG tablet Take 1 tablet (5 mg total) by mouth every 8 (eight) hours as needed (muscle spasm or pain). Patient not taking: Reported on 12/30/2015 03/30/14   Clayton Bibles, PA-C  HYDROcodone-acetaminophen (NORCO/VICODIN) 5-325 MG tablet Take 1 tablet by mouth every 4 (four) hours as needed. Patient not taking: Reported on 09/19/2018 12/31/15   Larene Pickett, PA-C  ibuprofen (ADVIL,MOTRIN) 200 MG tablet Take 200 mg by mouth every 6 (six) hours as needed for moderate pain.     [provider]  ondansetron (ZOFRAN-ODT) 4 MG disintegrating tablet Take 1 tablet (4 mg total) by mouth every 8 (eight) hours as needed for nausea or vomiting. 09/19/18   Zigmund Gottron, NP    Family History No family history on file.  Social History Social History   Tobacco Use  . Smoking status: Current Every Day Smoker    Packs/day: 0.50    Types: Cigarettes  Substance  Use Topics  . Alcohol use: No  . Drug use: No     Allergies   Amoxicillin   Review of Systems Review of Systems  Constitutional: Negative for chills and fever.  HENT: Negative for ear pain and sore throat.   Eyes: Negative for pain and visual disturbance.  Respiratory: Negative for cough and shortness of breath.   Cardiovascular: Negative for chest pain and palpitations.  Gastrointestinal: Negative for abdominal pain, diarrhea and vomiting.  Genitourinary: Negative for dysuria and hematuria.  Musculoskeletal: Negative for arthralgias and back pain.  Skin: Negative for color change and rash.  Neurological: Negative for seizures and syncope.  All other systems reviewed and are negative.    Physical Exam Triage Vital Signs ED Triage Vitals  Enc Vitals Group     BP 07/14/19 1622 (!) 122/50     Pulse Rate 07/14/19 1622 82     Resp 07/14/19 1622 18     Temp 07/14/19 1622 98.4 F (36.9 C)     Temp Source 07/14/19 1622 Oral     SpO2 07/14/19 1622 95 %     Weight --      Height --      Head Circumference --      Peak Flow --      Pain Score 07/14/19 1623 2     Pain Loc --  Pain Edu? --      Excl. in GC? --    No data found.  Updated Vital Signs BP (!) 122/50 (BP Location: Right Arm)   Pulse 82   Temp 98.4 F (36.9 C) (Oral)   Resp 18   SpO2 95%   Visual Acuity Right Eye Distance:   Left Eye Distance:   Bilateral Distance:    Right Eye Near:   Left Eye Near:    Bilateral Near:     Physical Exam Vitals signs and nursing note reviewed.  Constitutional:      General: She is not in acute distress.    Appearance: She is well-developed.  HENT:     Head: Normocephalic and atraumatic.     Right Ear: Tympanic membrane normal.     Left Ear: Tympanic membrane normal.     Mouth/Throat:     Mouth: Mucous membranes are moist.     Pharynx: Oropharynx is clear.  Eyes:     Conjunctiva/sclera: Conjunctivae normal.  Neck:     Musculoskeletal: Neck supple.   Cardiovascular:     Rate and Rhythm: Normal rate and regular rhythm.  Pulmonary:     Effort: Pulmonary effort is normal. No respiratory distress.     Breath sounds: Normal breath sounds.  Abdominal:     Palpations: Abdomen is soft.     Tenderness: There is no abdominal tenderness. There is no guarding or rebound.  Skin:    General: Skin is warm and dry.     Findings: No rash.  Neurological:     Mental Status: She is alert.      UC Treatments / Results  Labs (all labs ordered are listed, but only abnormal results are displayed) Labs Reviewed - No data to display  EKG   Radiology No results found.  Procedures Procedures (including critical care time)  Medications Ordered in UC Medications - No data to display  Initial Impression / Assessment and Plan / UC Course  I have reviewed the triage vital signs and the nursing notes.  Pertinent labs & imaging results that were available during my care of the patient were reviewed by me and considered in my medical decision making (see chart for details).   Suspected COVID.  COVID test performed here and pending.  Instructed patient to self quarantine until her test result is back and is negative.  Discussed that she should go to the emergency department if she develops difficulty breathing, high fever, or diarrhea.     Final Clinical Impressions(s) / UC Diagnoses   Final diagnoses:  Suspected Covid-19 Virus Infection     Discharge Instructions     Your COVID test is pending.  You should self quarantine until your test result is back and is negative.        ED Prescriptions    None     Controlled Substance Prescriptions [place] Controlled Substance Registry consulted? Not Applicable   Mickie Bailate, Desarae Placide H, NP 07/14/19 1651

## 2019-07-14 NOTE — Discharge Instructions (Signed)
Your COVID test is pending.  You should self quarantine until your test result is back and is negative.

## 2019-07-16 LAB — NOVEL CORONAVIRUS, NAA (HOSP ORDER, SEND-OUT TO REF LAB; TAT 18-24 HRS): SARS-CoV-2, NAA: NOT DETECTED

## 2019-07-17 ENCOUNTER — Encounter (HOSPITAL_COMMUNITY): Payer: Self-pay

## 2022-03-04 DIAGNOSIS — N3281 Overactive bladder: Secondary | ICD-10-CM | POA: Insufficient documentation

## 2022-08-23 ENCOUNTER — Emergency Department: Payer: Medicaid Other

## 2022-08-23 ENCOUNTER — Emergency Department
Admission: EM | Admit: 2022-08-23 | Discharge: 2022-08-23 | Disposition: A | Discharge status: Home / Self Care | Payer: Medicaid Other | Attending: Emergency Medicine | Admitting: Emergency Medicine

## 2022-08-23 ENCOUNTER — Encounter: Payer: Self-pay | Admitting: Medical Oncology

## 2022-08-23 DIAGNOSIS — H60501 Unspecified acute noninfective otitis externa, right ear: Secondary | ICD-10-CM

## 2022-08-23 DIAGNOSIS — H9201 Otalgia, right ear: Secondary | ICD-10-CM | POA: Diagnosis present

## 2022-08-23 DIAGNOSIS — Z20822 Contact with and (suspected) exposure to covid-19: Secondary | ICD-10-CM | POA: Diagnosis not present

## 2022-08-23 LAB — CBC WITH DIFFERENTIAL/PLATELET
Abs Immature Granulocytes: 0.04 10*3/uL (ref 0.00–0.07)
Basophils Absolute: 0 10*3/uL (ref 0.0–0.1)
Basophils Relative: 0 %
Eosinophils Absolute: 0.1 10*3/uL (ref 0.0–0.5)
Eosinophils Relative: 1 %
HCT: 39.2 % (ref 36.0–46.0)
Hemoglobin: 13.2 g/dL (ref 12.0–15.0)
Immature Granulocytes: 0 %
Lymphocytes Relative: 23 %
Lymphs Abs: 2.4 10*3/uL (ref 0.7–4.0)
MCH: 31.9 pg (ref 26.0–34.0)
MCHC: 33.7 g/dL (ref 30.0–36.0)
MCV: 94.7 fL (ref 80.0–100.0)
Monocytes Absolute: 0.7 10*3/uL (ref 0.1–1.0)
Monocytes Relative: 7 %
Neutro Abs: 7.1 10*3/uL (ref 1.7–7.7)
Neutrophils Relative %: 69 %
Platelets: 244 10*3/uL (ref 150–400)
RBC: 4.14 MIL/uL (ref 3.87–5.11)
RDW: 12.5 % (ref 11.5–15.5)
WBC: 10.3 10*3/uL (ref 4.0–10.5)
nRBC: 0 % (ref 0.0–0.2)

## 2022-08-23 LAB — COMPREHENSIVE METABOLIC PANEL
ALT: 14 U/L (ref 0–44)
AST: 19 U/L (ref 15–41)
Albumin: 3.9 g/dL (ref 3.5–5.0)
Alkaline Phosphatase: 58 U/L (ref 38–126)
Anion gap: 7 (ref 5–15)
BUN: 7 mg/dL (ref 6–20)
CO2: 23 mmol/L (ref 22–32)
Calcium: 8.6 mg/dL — ABNORMAL LOW (ref 8.9–10.3)
Chloride: 107 mmol/L (ref 98–111)
Creatinine, Ser: 0.77 mg/dL (ref 0.44–1.00)
GFR, Estimated: >60 mL/min (ref >60)
Glucose, Bld: 112 mg/dL — ABNORMAL HIGH (ref 70–99)
Potassium: 3.6 mmol/L (ref 3.5–5.1)
Sodium: 137 mmol/L (ref 135–145)
Total Bilirubin: 0.7 mg/dL (ref 0.3–1.2)
Total Protein: 7.1 g/dL (ref 6.5–8.1)

## 2022-08-23 LAB — URINALYSIS, ROUTINE W REFLEX MICROSCOPIC
Bilirubin Urine: NEGATIVE
Glucose, UA: NEGATIVE mg/dL
Ketones, ur: NEGATIVE mg/dL
Leukocytes,Ua: NEGATIVE
Nitrite: NEGATIVE
Protein, ur: NEGATIVE mg/dL
Specific Gravity, Urine: 1.005 (ref 1.005–1.030)
pH: 6 (ref 5.0–8.0)

## 2022-08-23 LAB — SARS CORONAVIRUS 2 BY RT PCR: SARS Coronavirus 2 by RT PCR: NEGATIVE

## 2022-08-23 LAB — LACTIC ACID, PLASMA: Lactic Acid, Venous: 1.4 mmol/L (ref 0.5–1.9)

## 2022-08-23 LAB — POC URINE PREG, ED: Preg Test, Ur: NEGATIVE

## 2022-08-23 MED ORDER — CIPROFLOXACIN HCL 500 MG PO TABS: 500.0000 mg | ORAL_TABLET | Freq: Two times a day (BID) | ORAL | 0 refills | Status: AC

## 2022-08-23 MED ORDER — SODIUM CHLORIDE 0.9 % IV BOLUS
1000.0000 mL | Freq: Once | INTRAVENOUS | Status: AC
  Administered 2022-08-23: 1000 mL via INTRAVENOUS

## 2022-08-23 MED ORDER — OFLOXACIN 0.3 % OT SOLN: 5.0000 [drp] | Freq: Two times a day (BID) | OTIC | 0 refills | Status: AC

## 2022-08-23 MED ORDER — OFLOXACIN 0.3 % OT SOLN: 5.0000 [drp] | Freq: Every day | OTIC | 0 refills | Status: DC

## 2022-08-23 MED ORDER — CIPROFLOXACIN HCL 500 MG PO TABS: 500.0000 mg | ORAL_TABLET | Freq: Two times a day (BID) | ORAL | 0 refills | Status: DC

## 2022-08-23 MED ORDER — IOHEXOL 300 MG/ML  SOLN
75.0000 mL | Freq: Once | INTRAMUSCULAR | Status: AC | PRN
  Administered 2022-08-23: 75 mL via INTRAVENOUS

## 2022-08-23 MED ORDER — HYDROMORPHONE HCL 1 MG/ML IJ SOLN
0.5000 mg | Freq: Once | INTRAMUSCULAR | Status: AC
  Administered 2022-08-23: 0.5 mg via INTRAVENOUS
  Filled 2022-08-23: qty 0.5

## 2022-08-23 MED ORDER — OXYCODONE HCL 5 MG PO TABS: 5.0000 mg | ORAL_TABLET | Freq: Three times a day (TID) | ORAL | 0 refills | Status: AC | PRN

## 2022-08-23 NOTE — ED Triage Notes (Signed)
Pt reports that she has been having rt ear pain x 2 weeks with fever.

## 2022-08-23 NOTE — Discharge Instructions (Addendum)
  You would take the antibiotics and use the drops.  You can just do it in the right ear if you are only having the symptoms in the right ear but if you are having symptoms in the left ear you can use in the left ear as well.  You can call the ENT doctor to make a follow-up appointment and return to the ER if you develop redness or swelling behind the ear worsening pain or any other concerns   IMPRESSION: Right otitis externa without complicating osteomyelitis or abscess. Only mild right middle ear and mastoid opacification.  Take oxycodone as prescribed. Do not drink alcohol, drive or participate in any other potentially dangerous activities while taking this medication as it may make you sleepy. Do not take this medication with any other sedating medications, either prescription or over-the-counter. If you were prescribed Percocet or Vicodin, do not take these with acetaminophen (Tylenol) as it is already contained within these medications.  This medication is an opiate (or narcotic) pain medication and can be habit forming. Use it as little as possible to achieve adequate pain control. Do not use or use it with extreme caution if you have a history of opiate abuse or dependence. If you are on a pain contract with your primary care doctor or a pain specialist, be sure to let them know you were prescribed this medication today from the Emergency Department. This medication is intended for your use only - do not give any to anyone else and keep it in a secure place where nobody else, especially children, have access to it.

## 2022-08-23 NOTE — ED Provider Notes (Signed)
[facility] Provider Note    Event Date/Time   First MD Initiated Contact with Patient 08/23/22 [phone]     (approximate)   History   Otalgia   HPI  Lindsay Harris is a 46 y.o. female who is otherwise healthy not on any medications who comes in with concern for right ear pain.  Patient reports having some itching of her ears but then more recently is developing a lot of pain in her right ear.  She reports a fever of 101.3 and took ibuprofen before coming in.  She denies any chest pain, shortness of breath or other symptoms.      Physical Exam   Triage Vital Signs: ED Triage Vitals  Enc Vitals Group     BP 08/23/22 0847 111/77     Pulse Rate 08/23/22 0847 (!) 108     Resp 08/23/22 0852 20     Temp 08/23/22 0847 99 F (37.2 C)     Temp src --      SpO2 08/23/22 0847 90 %     Weight 08/23/22 0843 152 lb 1.9 oz (69 kg)     Height 08/23/22 0843 5\' 2"  (1.575 m)     Head Circumference --      Peak Flow --      Pain Score 08/23/22 0843 10     Pain Loc --      Pain Edu? --      Excl. in GC? --     Most recent vital signs: Vitals:   08/23/22 0847 08/23/22 0852  BP: 111/77   Pulse: (!) 108   Resp:  20  Temp: 99 F (37.2 C)   SpO2: 90%      General: Awake, no distress.  CV:  Good peripheral perfusion.  Resp:  Normal effort.  Abd:  No distention.  Other:  Poor dentition of her mouth.  She is got tenderness along the mastoid area and tenderness of the right outer ear.  Attempted to visualize the membrane patient is crying and screaming in pain.  Canal seems to be open but have difficulty visualizing TM due to patient's discomfort   ED Results / Procedures / Treatments   Labs (all labs ordered are listed, but only abnormal results are displayed) Labs Reviewed  SARS CORONAVIRUS 2 BY RT PCR  CULTURE, BLOOD (SINGLE)  CBC WITH DIFFERENTIAL/PLATELET  COMPREHENSIVE METABOLIC PANEL  URINALYSIS, ROUTINE W REFLEX MICROSCOPIC  LACTIC ACID, PLASMA   LACTIC ACID, PLASMA  POC URINE PREG, ED     RADIOLOGY I have reviewed the CT personally and interpreted and no evidence of mastoid erosion.  IMPRESSION: Right otitis externa without complicating osteomyelitis or abscess. Only mild right middle ear and mastoid opacification.  PROCEDURES:  Critical Care performed: No  Procedures   MEDICATIONS ORDERED IN ED: Medications - No data to display   IMPRESSION / MDM / ASSESSMENT AND PLAN / ED COURSE  I reviewed the triage vital signs and the nursing notes.   Patient's presentation is most consistent with acute presentation with potential threat to life or bodily function.   Patient comes in tachycardic with elevated temperatures at home.  Patient afebrile here but she did take fever reducer before coming in.  Therefore I have initiated septic work-up and will get CT imaging to evaluate for any evidence of mastoiditis, chest x-ray evaluate for pneumonia, COVID test, urine test.  Patient given some Dilaudid to help facilitate examination due to patient's pain and patient  given some IV fluids due to tachycardia.  Pregnancy test was negative.  CBC reassuring.  CMP reassuring COVID test was negative.  Lactate was normal.  Urine without evidence of UTI.  IMPRESSION: Right otitis externa without complicating osteomyelitis or abscess. Only mild right middle ear and mastoid opacification.  Reevaluated patient rechecked a temperature was 98.6 and this without any fever reducers.  CT imaging overall just shows otitis externa however given patient's significant amount of pain and discomfort will cover with oral ciprofloxacin as well as given otic solution.  Given she reported some symptoms in the left ear as well we will have her applied to both ears.  Patient given ENT number for follow-up.  She understands return precautions in regards to worsening swelling behind her ear, pain not getting better or any other concerns.  Patient already reports  taking Tylenol and ibuprofen at home for pain.  We discussed doing a short course of oxycodone but we discussed the risk for addiction and do not drive on it.  She expressed understanding and felt comfortable with plan.  Considered admission but after a liter of fluid and her heart rates have come down she does not meet sepsis criteria and patient is well-appearing and looks comfortable with discharge home  The patient is on the cardiac monitor to evaluate for evidence of arrhythmia and/or significant heart rate changes.      FINAL CLINICAL IMPRESSION(S) / ED DIAGNOSES   Final diagnoses:  Acute otitis externa of right ear, unspecified type     Rx / DC Orders   ED Discharge Orders          Ordered    ciprofloxacin (CIPRO) 500 MG tablet  2 times daily        08/23/22 1325    ofloxacin (FLOXIN) 0.3 % OTIC solution  Daily        08/23/22 1325             Note:  This document was prepared using Dragon voice recognition software and may include unintentional dictation errors.   Concha Se, MD 08/23/22 1335

## 2022-08-25 LAB — CULTURE, BLOOD (SINGLE)

## 2022-08-28 LAB — CULTURE, BLOOD (SINGLE): Culture: NO GROWTH

## 2022-12-27 DIAGNOSIS — M5090 Cervical disc disorder, unspecified, unspecified cervical region: Secondary | ICD-10-CM | POA: Insufficient documentation

## 2023-01-26 DIAGNOSIS — K219 Gastro-esophageal reflux disease without esophagitis: Secondary | ICD-10-CM | POA: Insufficient documentation

## 2023-01-26 DIAGNOSIS — G43909 Migraine, unspecified, not intractable, without status migrainosus: Secondary | ICD-10-CM | POA: Insufficient documentation

## 2023-01-26 DIAGNOSIS — R7303 Prediabetes: Secondary | ICD-10-CM | POA: Insufficient documentation

## 2023-02-02 DIAGNOSIS — F411 Generalized anxiety disorder: Secondary | ICD-10-CM | POA: Insufficient documentation

## 2023-02-12 ENCOUNTER — Other Ambulatory Visit: Payer: Self-pay | Admitting: Family Medicine

## 2023-02-12 DIAGNOSIS — Z1231 Encounter for screening mammogram for malignant neoplasm of breast: Secondary | ICD-10-CM

## 2023-03-09 ENCOUNTER — Ambulatory Visit
Admission: RE | Admit: 2023-03-09 | Discharge: 2023-03-09 | Disposition: A | Discharge status: Home / Self Care | Payer: Medicaid Other | Source: Ambulatory Visit | Attending: Family Medicine | Admitting: Family Medicine

## 2023-03-09 DIAGNOSIS — Z1231 Encounter for screening mammogram for malignant neoplasm of breast: Secondary | ICD-10-CM | POA: Insufficient documentation

## 2023-03-30 DIAGNOSIS — J449 Chronic obstructive pulmonary disease, unspecified: Secondary | ICD-10-CM | POA: Insufficient documentation

## 2023-04-11 ENCOUNTER — Encounter: Payer: Self-pay | Admitting: Family Medicine

## 2023-04-24 DIAGNOSIS — E0590 Thyrotoxicosis, unspecified without thyrotoxic crisis or storm: Secondary | ICD-10-CM | POA: Insufficient documentation

## 2023-06-07 ENCOUNTER — Emergency Department
Admission: EM | Admit: 2023-06-07 | Discharge: 2023-06-07 | Disposition: A | Discharge status: Home / Self Care | Payer: Medicaid Other | Attending: Emergency Medicine | Admitting: Emergency Medicine

## 2023-06-07 ENCOUNTER — Emergency Department: Payer: Medicaid Other

## 2023-06-07 ENCOUNTER — Encounter: Payer: Self-pay | Admitting: Radiology

## 2023-06-07 DIAGNOSIS — E039 Hypothyroidism, unspecified: Secondary | ICD-10-CM | POA: Insufficient documentation

## 2023-06-07 DIAGNOSIS — R109 Unspecified abdominal pain: Secondary | ICD-10-CM | POA: Diagnosis present

## 2023-06-07 DIAGNOSIS — N83201 Unspecified ovarian cyst, right side: Secondary | ICD-10-CM | POA: Insufficient documentation

## 2023-06-07 DIAGNOSIS — N83202 Unspecified ovarian cyst, left side: Secondary | ICD-10-CM | POA: Diagnosis not present

## 2023-06-07 LAB — URINALYSIS, ROUTINE W REFLEX MICROSCOPIC
Bilirubin Urine: NEGATIVE
Glucose, UA: NEGATIVE mg/dL
Hgb urine dipstick: NEGATIVE
Ketones, ur: NEGATIVE mg/dL
Leukocytes,Ua: NEGATIVE
Nitrite: NEGATIVE
Protein, ur: NEGATIVE mg/dL
Specific Gravity, Urine: 1.018 (ref 1.005–1.030)
pH: 6 (ref 5.0–8.0)

## 2023-06-07 LAB — COMPREHENSIVE METABOLIC PANEL
ALT: 20 U/L (ref 0–44)
AST: 21 U/L (ref 15–41)
Albumin: 4 g/dL (ref 3.5–5.0)
Alkaline Phosphatase: 79 U/L (ref 38–126)
Anion gap: 9 (ref 5–15)
BUN: 14 mg/dL (ref 6–20)
CO2: 21 mmol/L — ABNORMAL LOW (ref 22–32)
Calcium: 8.7 mg/dL — ABNORMAL LOW (ref 8.9–10.3)
Chloride: 105 mmol/L (ref 98–111)
Creatinine, Ser: 0.97 mg/dL (ref 0.44–1.00)
GFR, Estimated: >60 mL/min (ref >60)
Glucose, Bld: 124 mg/dL — ABNORMAL HIGH (ref 70–99)
Potassium: 4.3 mmol/L (ref 3.5–5.1)
Sodium: 135 mmol/L (ref 135–145)
Total Bilirubin: 0.6 mg/dL (ref 0.3–1.2)
Total Protein: 7.8 g/dL (ref 6.5–8.1)

## 2023-06-07 LAB — CBC
HCT: 42.7 % (ref 36.0–46.0)
Hemoglobin: 14.2 g/dL (ref 12.0–15.0)
MCH: 31.9 pg (ref 26.0–34.0)
MCHC: 33.3 g/dL (ref 30.0–36.0)
MCV: 96 fL (ref 80.0–100.0)
Platelets: 353 10*3/uL (ref 150–400)
RBC: 4.45 MIL/uL (ref 3.87–5.11)
RDW: 13.4 % (ref 11.5–15.5)
WBC: 8 10*3/uL (ref 4.0–10.5)
nRBC: 0 % (ref 0.0–0.2)

## 2023-06-07 LAB — LIPASE, BLOOD: Lipase: 38 U/L (ref 11–51)

## 2023-06-07 LAB — POC URINE PREG, ED: Preg Test, Ur: NEGATIVE

## 2023-06-07 MED ORDER — DIPHENHYDRAMINE HCL 50 MG/ML IJ SOLN
25.0000 mg | Freq: Once | INTRAMUSCULAR | Status: AC
  Administered 2023-06-07: 25 mg via INTRAVENOUS
  Filled 2023-06-07: qty 1

## 2023-06-07 MED ORDER — IOHEXOL 300 MG/ML  SOLN
100.0000 mL | Freq: Once | INTRAMUSCULAR | Status: AC | PRN
  Administered 2023-06-07: 100 mL via INTRAVENOUS

## 2023-06-07 MED ORDER — ONDANSETRON HCL 4 MG/2ML IJ SOLN
4.0000 mg | Freq: Once | INTRAMUSCULAR | Status: AC
  Administered 2023-06-07: 4 mg via INTRAVENOUS
  Filled 2023-06-07: qty 2

## 2023-06-07 MED ORDER — MORPHINE SULFATE (PF) 4 MG/ML IV SOLN
4.0000 mg | Freq: Once | INTRAVENOUS | Status: AC
  Administered 2023-06-07: 4 mg via INTRAVENOUS
  Filled 2023-06-07: qty 1

## 2023-06-07 NOTE — ED Triage Notes (Signed)
Pt here with central abd pain radiating to her back that started at 0400. Pt states she is having severe nausea as well. Pt moaning and crying in triage.

## 2023-06-07 NOTE — Discharge Instructions (Addendum)
You have cysts on both your ovaries.  You should have a repeat ultrasound of the pelvis in 3 to 6 months to monitor these.  Please follow-up with an OB/GYN.  If your pain returns or is worse or you are not able to keep fluids down or develop fever then please return to the emergency department.

## 2023-06-07 NOTE — ED Provider Notes (Signed)
[facility] Provider Note    Event Date/Time   First MD Initiated Contact with Patient 06/07/23 1558     (approximate)   History   Abdominal Pain   HPI  Lindsay Harris is a 47 y.o. female with past medical history of migraines, hypothyroidism, depression/anxiety presents with abdominal pain.  Pain woke her up around 4 AM.  She points to the suprapubic region and left mid abdomen when asked about location of pain.  Pain has not moved since onset.  She has nausea but no vomiting.  Last BM was yesterday and was normal no diarrhea.  Denies fevers or chills.  Denies any urinary urgency frequency dysuria.  Denies any abnormal vaginal discharge.  Denies history of similar pain.  No prior abdominal surgeries other than having her tubes tied.  She does still get a menstrual period that is just some mild spotting.  This was last on 6/4.     History reviewed. No pertinent past medical history.  There are no problems to display for this patient.    Physical Exam  Triage Vital Signs: ED Triage Vitals [06/07/23 1435]  Enc Vitals Group     BP (!) 132/107     Pulse Rate 94     Resp 18     Temp 98.5 F (36.9 C)     Temp Source Oral     SpO2 96 %     Weight 152 lb 1.9 oz (69 kg)     Height 5\' 2"  (1.575 m)     Head Circumference      Peak Flow      Pain Score 10     Pain Loc      Pain Edu?      Excl. in GC?     Most recent vital signs: Vitals:   06/07/23 1730 06/07/23 1823  BP: 124/82 105/66  Pulse: 70 69  Resp: (!) 24 15  Temp:    SpO2: 97% 99%     General: Awake, patient appears uncomfortable. CV:  Good peripheral perfusion.  Resp:  Normal effort.  Abd:  Abdomen is not rigid but is not soft, involuntary guarding in the suprapubic region and left lower quadrant mild CVA tenderness on the left Neuro:             Awake, Alert, Oriented x 3  Other:     ED Results / Procedures / Treatments  Labs (all labs ordered are listed, but only abnormal  results are displayed) Labs Reviewed  COMPREHENSIVE METABOLIC PANEL - Abnormal; Notable for the following components:      Result Value   CO2 21 (*)    Glucose, Bld 124 (*)    Calcium 8.7 (*)    All other components within normal limits  URINALYSIS, ROUTINE W REFLEX MICROSCOPIC - Abnormal; Notable for the following components:   Color, Urine YELLOW (*)    APPearance HAZY (*)    All other components within normal limits  LIPASE, BLOOD  CBC  POC URINE PREG, ED     EKG    RADIOLOGY    PROCEDURES:  Critical Care performed: No  Procedures  The patient is on the cardiac monitor to evaluate for evidence of arrhythmia and/or significant heart rate changes.   MEDICATIONS ORDERED IN ED: Medications  morphine (PF) 4 MG/ML injection 4 mg (4 mg Intravenous Given 06/07/23 1619)  ondansetron (ZOFRAN) injection 4 mg (4 mg Intravenous Given 06/07/23 1619)  iohexol (OMNIPAQUE) 300 MG/ML  solution 100 mL (100 mLs Intravenous Contrast Given 06/07/23 1635)  diphenhydrAMINE (BENADRYL) injection 25 mg (25 mg Intravenous Given 06/07/23 1742)     IMPRESSION / MDM / ASSESSMENT AND PLAN / ED COURSE  I reviewed the triage vital signs and the nursing notes.                              Patient's presentation is most consistent with acute presentation with potential threat to life or bodily function.  Differential diagnosis includes, but is not limited to, diverticulitis, perforation, bowel obstruction, ovarian torsion, kidney stone  Patient is a 47 year old female who presents with severe left lower quadrant and suprapubic abdominal pain that started around 4 AM.  Patient hypertensive on arrival.  On my evaluation she looks uncomfortable and is tearful.  She points to both suprapubic region and the left mid abdomen when asked about location of the pain and says that it has not moved since onset.  She is nauseous but has not vomited and is not having any diarrhea or constipation or urinary  symptoms.  Exam is concerning abdomen is not rigid per se but it certainly not soft and she has some involuntary guarding.  Periumbilical region and left lower quadrant and left mid abdomen. Labs are fairly unremarkable with no leukocytosis normal lipase and LFTs.  She did provide a POC urine so we can get her to CT.  Will give morphine and Zofran for pain and nausea.  CT of the abdomen pelvis does not have any acute findings.  Does have bilateral ovarian cyst with some free fluid in the cul-de-sac which could be evidence of recent ruptured ovarian cyst/follicle.  Given patient's significant pain on onset I did obtain a pelvic ultrasound to evaluate for torsion.  Patient has cysts on both ovaries right appears complex left is simple and there is good flow.  Reassessment patient feels improved pain is significantly improved.  She did have some hives after getting morphine but this responded to Benadryl.  Recommended she have repeat ultrasound in about 3 to 6 months.  Given she is feeling improved with no emergent findings on imaging and blood work and urinalysis reassuring I do think she can be discharged.   Clinical Course as of 06/07/23 1854  Thu Jun 07, 2023  1631 Preg Test, Ur: NEGATIVE [KM]    Clinical Course User Index [KM] Georga Hacking, MD     FINAL CLINICAL IMPRESSION(S) / ED DIAGNOSES   Final diagnoses:  Cysts of both ovaries     Rx / DC Orders   ED Discharge Orders     None        Note:  This document was prepared using Dragon voice recognition software and may include unintentional dictation errors.   Georga Hacking, MD 06/07/23 [phone]

## 2023-06-14 DIAGNOSIS — R739 Hyperglycemia, unspecified: Secondary | ICD-10-CM | POA: Insufficient documentation

## 2023-06-29 ENCOUNTER — Encounter: Payer: Self-pay | Admitting: Obstetrics

## 2023-06-29 ENCOUNTER — Ambulatory Visit (INDEPENDENT_AMBULATORY_CARE_PROVIDER_SITE_OTHER): Payer: Medicaid Other | Admitting: Obstetrics

## 2023-06-29 ENCOUNTER — Other Ambulatory Visit (HOSPITAL_COMMUNITY)
Admission: RE | Admit: 2023-06-29 | Discharge: 2023-06-29 | Disposition: A | Discharge status: Home / Self Care | Payer: Medicaid Other | Source: Ambulatory Visit | Attending: Obstetrics | Admitting: Obstetrics

## 2023-06-29 VITALS — BP 112/51 | HR 77 | Wt 201.8 lb

## 2023-06-29 DIAGNOSIS — F526 Dyspareunia not due to a substance or known physiological condition: Secondary | ICD-10-CM

## 2023-06-29 DIAGNOSIS — N83209 Unspecified ovarian cyst, unspecified side: Secondary | ICD-10-CM

## 2023-06-29 DIAGNOSIS — N941 Unspecified dyspareunia: Secondary | ICD-10-CM | POA: Insufficient documentation

## 2023-06-29 DIAGNOSIS — N898 Other specified noninflammatory disorders of vagina: Secondary | ICD-10-CM | POA: Insufficient documentation

## 2023-06-29 DIAGNOSIS — F17200 Nicotine dependence, unspecified, uncomplicated: Secondary | ICD-10-CM

## 2023-06-29 DIAGNOSIS — N83201 Unspecified ovarian cyst, right side: Secondary | ICD-10-CM | POA: Insufficient documentation

## 2023-06-29 NOTE — Progress Notes (Signed)
Obstetrics & Gynecology Office Visit   Chief Complaint:  Chief Complaint  Patient presents with   Hospitalization Follow-up    History of Present Illness: Lindsay Harris is a 47 yo new patient referred by the ED for f/u after a diagnosis of bilateral ovarian cysts. She was seen at [facility] recently for acute left sided adnexal pain. Several ultrasounds later, she was diagnosed with a right complex cyst and another simple cyst on her left side. Today she shares that she has had cysts like this for years. She has not had a GYN physical in "ages" and is over due for a pap smear.  She is married x 29 years, and has three children. She also shares a previous hx of SA in childhood, and a disinterest in sexual intercourse because it is so painful. She rarely has IC with her husband due to inability to tolerate penetration. Of note, her children were all born vaginally, but she required epidural and redosing per her report.  She does have a PCP   Review of Systems:  Review of Systems  Constitutional: Negative.   HENT: Negative.    Eyes: Negative.   Respiratory:         Asthma, COPD  Smokes two pack per day cigs  Cardiovascular: Negative.   Gastrointestinal: Negative.   Musculoskeletal:  Positive for back pain.       Has chronic back pain.  Skin: Negative.   Neurological: Negative.   Endo/Heme/Allergies: Negative.   Psychiatric/Behavioral:  The patient is nervous/anxious.      Past Medical History:  No past medical history on file.  Past Surgical History:  No past surgical history on file.  Gynecologic History: Patient's last menstrual period was 06/15/2023 (exact date).  Obstetric History: No obstetric history on file.  Family History:  Family History  Problem Relation Age of Onset   Breast cancer Maternal Grandmother     Social History:  Social History   Socioeconomic History   Marital status: Married    Spouse name: Not on file   Number of children: Not on file   Years of  education: Not on file   Highest education level: Not on file  Occupational History   Not on file  Tobacco Use   Smoking status: Every Day    Current packs/day: 0.50    Types: Cigarettes   Smokeless tobacco: Not on file  Substance and Sexual Activity   Alcohol use: No   Drug use: No   Sexual activity: Not on file  Other Topics Concern   Not on file  Social History Narrative   Not on file   Social Determinants of Health   Financial Resource Strain: Not on file  Food Insecurity: Not on file  Transportation Needs: Not on file  Physical Activity: Not on file  Stress: Not on file  Social Connections: Not on file  Intimate Partner Violence: Not on file    Allergies:  Allergies  Allergen Reactions   Amoxicillin Anaphylaxis and Hives    Medications: Prior to Admission medications   Medication Sig Start Date End Date Taking? Authorizing Provider  clindamycin (CLEOCIN) 150 MG capsule Take 2 capsules (300 mg total) by mouth 3 (three) times daily. May dispense as 150mg  capsules 12/31/15  Yes Garlon Hatchet, PA-C  ibuprofen (ADVIL,MOTRIN) 200 MG tablet Take 200 mg by mouth every 6 (six) hours as needed for moderate pain.    Yes [provider]  ondansetron (ZOFRAN-ODT) 4 MG disintegrating tablet Take  1 tablet (4 mg total) by mouth every 8 (eight) hours as needed for nausea or vomiting. 09/19/18  Yes Burky, Dorene Grebe B, NP  diazepam (VALIUM) 5 MG tablet Take 1 tablet (5 mg total) by mouth every 8 (eight) hours as needed (muscle spasm or pain). Patient not taking: Reported on 12/30/2015 03/30/14   Trixie Dredge, PA-C  HYDROcodone-acetaminophen (NORCO/VICODIN) 5-325 MG tablet Take 1 tablet by mouth every 4 (four) hours as needed. Patient not taking: Reported on 09/19/2018 12/31/15   Garlon Hatchet, PA-C    Physical Exam Vitals:  Vitals:   06/29/23 0903  BP: (!) 112/51  Pulse: 77   Patient's last menstrual period was 06/15/2023 (exact date).  Physical Exam Constitutional:       Appearance: She is obese.  Cardiovascular:     Rate and Rhythm: Normal rate and regular rhythm.     Pulses: Normal pulses.     Heart sounds: Normal heart sounds.  Pulmonary:     Breath sounds: Normal breath sounds.     Comments: Difficulty taking deep breaths Genitourinary:    General: Normal vulva.     Rectum: Normal.     Comments: No external lesions Unable to tolerated vaginal exam- c/o severe pain with one digit placed in the vagina. No obvious tightening of muscles during this exam, but became teaful and appeared quite distressed when provider gently attempted exam. Unable to perform bimanual exam Musculoskeletal:        General: Normal range of motion.     Cervical back: Normal range of motion and neck supple.  Skin:    General: Skin is warm and dry.  Neurological:     General: No focal deficit present.     Mental Status: She is oriented to person, place, and time.  Psychiatric:     Comments: Tearful at times during the physical exam- relates hx of mood issues, but not under care of a [facility] provider.      Assessment: 47 y.o. for f/u on bilateral ovarian cysts noted at ED visit.  Plan: Problem List Items Addressed This Visit   None Visit Diagnoses     Cyst of ovary, unspecified laterality    -  Primary   Relevant Orders   Ovarian Malignancy Risk-ROMA   Vaginal discharge       Relevant Orders   Cervicovaginal ancillary only   Dyspareunia, psychogenic       Relevant Orders   Ambulatory referral to Psychiatry     I consulted with Dr. Brennan Bailey regarding her ultrasounds. We will order ROMA testing.  Repeat ultraound ordered for 3-4 weeks out. Follow up appointment with Dr. Valentino Saxon to review the scan and discuss testing.  As she is a smoker, I did encourage her to consider quitting, and this was in conjunction with the option of starting on low dosage OCPs to suppress her ovulation. She plans to continue the discussion on POPs or OCPs with Dr. Valentino Saxon as part of the  cyst follow up in 4 weeks.  Additional time was spent addressing her dyspareunia-she would be interested in a referral to PT for pelvic floor, and also a referral to Psychiatry to address the anxiety related to her dyspareunia- I have put in a referral to Behavioral Health for medication options. After consultation with Logan Bores, she has decided to wait on the pelvic floor PT referral until she meets with Dr. Valentino Saxon.  Mirna Mires, CNM  06/29/2023 5:12 PM

## 2023-06-30 ENCOUNTER — Encounter: Payer: Self-pay | Admitting: Obstetrics

## 2023-07-02 DIAGNOSIS — G4730 Sleep apnea, unspecified: Secondary | ICD-10-CM | POA: Insufficient documentation

## 2023-07-02 LAB — CERVICOVAGINAL ANCILLARY ONLY
Bacterial Vaginitis (gardnerella): NEGATIVE
Candida Glabrata: NEGATIVE
Candida Vaginitis: NEGATIVE
Comment: NEGATIVE
Comment: NEGATIVE
Comment: NEGATIVE

## 2023-07-06 ENCOUNTER — Other Ambulatory Visit: Payer: Medicaid Other

## 2023-07-06 DIAGNOSIS — N83209 Unspecified ovarian cyst, unspecified side: Secondary | ICD-10-CM

## 2023-07-09 LAB — OVARIAN MALIGNANCY RISK-ROMA
Cancer Antigen (CA) 125: 12.5 U/mL (ref 0.0–38.1)
HE4: 101 pmol/L — ABNORMAL HIGH (ref 0.0–63.6)
Postmenopausal ROMA: 1.91
Premenopausal ROMA: 2.98 — ABNORMAL HIGH

## 2023-07-09 LAB — POSTMENOPAUSAL INTERP: LOW

## 2023-07-09 LAB — PREMENOPAUSAL INTERP: HIGH

## 2023-07-18 ENCOUNTER — Encounter: Payer: Self-pay | Admitting: Obstetrics

## 2023-07-20 ENCOUNTER — Other Ambulatory Visit: Payer: Self-pay

## 2023-07-23 ENCOUNTER — Emergency Department: Payer: Medicaid Other

## 2023-07-23 ENCOUNTER — Ambulatory Visit (INDEPENDENT_AMBULATORY_CARE_PROVIDER_SITE_OTHER): Payer: Medicaid Other | Admitting: Pulmonary Disease

## 2023-07-23 ENCOUNTER — Other Ambulatory Visit: Payer: Self-pay

## 2023-07-23 ENCOUNTER — Emergency Department
Admission: EM | Admit: 2023-07-23 | Discharge: 2023-07-23 | Disposition: A | Discharge status: Home / Self Care | Payer: Medicaid Other | Attending: Emergency Medicine | Admitting: Emergency Medicine

## 2023-07-23 ENCOUNTER — Encounter: Payer: Self-pay | Admitting: Emergency Medicine

## 2023-07-23 ENCOUNTER — Encounter: Payer: Self-pay | Admitting: Pulmonary Disease

## 2023-07-23 VITALS — BP 136/78 | HR 73 | Temp 97.9°F | Ht 62.0 in | Wt 204.2 lb

## 2023-07-23 DIAGNOSIS — J4551 Severe persistent asthma with (acute) exacerbation: Secondary | ICD-10-CM

## 2023-07-23 DIAGNOSIS — Z7951 Long term (current) use of inhaled steroids: Secondary | ICD-10-CM | POA: Diagnosis not present

## 2023-07-23 DIAGNOSIS — J4550 Severe persistent asthma, uncomplicated: Secondary | ICD-10-CM | POA: Insufficient documentation

## 2023-07-23 DIAGNOSIS — J45901 Unspecified asthma with (acute) exacerbation: Secondary | ICD-10-CM

## 2023-07-23 DIAGNOSIS — F17200 Nicotine dependence, unspecified, uncomplicated: Secondary | ICD-10-CM | POA: Diagnosis not present

## 2023-07-23 DIAGNOSIS — R0602 Shortness of breath: Secondary | ICD-10-CM | POA: Diagnosis present

## 2023-07-23 LAB — TROPONIN I (HIGH SENSITIVITY): Troponin I (High Sensitivity): 4 ng/L (ref <18)

## 2023-07-23 LAB — BASIC METABOLIC PANEL
Anion gap: 10 (ref 5–15)
BUN: 6 mg/dL (ref 6–20)
CO2: 20 mmol/L — ABNORMAL LOW (ref 22–32)
Calcium: 8.9 mg/dL (ref 8.9–10.3)
Chloride: 105 mmol/L (ref 98–111)
Creatinine, Ser: 0.94 mg/dL (ref 0.44–1.00)
GFR, Estimated: >60 mL/min (ref >60)
Glucose, Bld: 106 mg/dL — ABNORMAL HIGH (ref 70–99)
Potassium: 3.7 mmol/L (ref 3.5–5.1)
Sodium: 135 mmol/L (ref 135–145)

## 2023-07-23 LAB — CBC WITH DIFFERENTIAL/PLATELET
Abs Immature Granulocytes: 0.06 10*3/uL (ref 0.00–0.07)
Basophils Absolute: 0.1 10*3/uL (ref 0.0–0.1)
Basophils Relative: 1 %
Eosinophils Absolute: 0.3 10*3/uL (ref 0.0–0.5)
Eosinophils Relative: 3 %
HCT: 42.3 % (ref 36.0–46.0)
Hemoglobin: 14.5 g/dL (ref 12.0–15.0)
Immature Granulocytes: 1 %
Lymphocytes Relative: 42 %
Lymphs Abs: 4 10*3/uL (ref 0.7–4.0)
MCH: 31.3 pg (ref 26.0–34.0)
MCHC: 34.3 g/dL (ref 30.0–36.0)
MCV: 91.4 fL (ref 80.0–100.0)
Monocytes Absolute: 0.6 10*3/uL (ref 0.1–1.0)
Monocytes Relative: 6 %
Neutro Abs: 4.6 10*3/uL (ref 1.7–7.7)
Neutrophils Relative %: 47 %
Platelets: 332 10*3/uL (ref 150–400)
RBC: 4.63 MIL/uL (ref 3.87–5.11)
RDW: 13.4 % (ref 11.5–15.5)
Smear Review: NORMAL
WBC Morphology: ABNORMAL
WBC: 9.7 10*3/uL (ref 4.0–10.5)
nRBC: 0 % (ref 0.0–0.2)

## 2023-07-23 LAB — BLOOD GAS, VENOUS
Acid-Base Excess: 4.1 mmol/L — ABNORMAL HIGH (ref 0.0–2.0)
Bicarbonate: 23.1 mmol/L (ref 20.0–28.0)
O2 Saturation: 81.5 %
Patient temperature: 37
pCO2, Ven: 21 mmHg — ABNORMAL LOW (ref 44–60)
pH, Ven: 7.65 (ref 7.25–7.43)
pO2, Ven: 38 mmHg (ref 32–45)

## 2023-07-23 MED ORDER — IPRATROPIUM-ALBUTEROL 0.5-2.5 (3) MG/3ML IN SOLN: 3.0000 mL | Freq: Once | RESPIRATORY_TRACT | Status: DC

## 2023-07-23 MED ORDER — MAGNESIUM SULFATE 2 GM/50ML IV SOLN
2.0000 g | Freq: Once | INTRAVENOUS | Status: AC
  Administered 2023-07-23: 2 g via INTRAVENOUS
  Filled 2023-07-23: qty 50

## 2023-07-23 MED ORDER — IPRATROPIUM-ALBUTEROL 0.5-2.5 (3) MG/3ML IN SOLN
6.0000 mL | Freq: Once | RESPIRATORY_TRACT | Status: AC
  Administered 2023-07-23: 6 mL via RESPIRATORY_TRACT
  Filled 2023-07-23: qty 6

## 2023-07-23 MED ORDER — IPRATROPIUM-ALBUTEROL 0.5-2.5 (3) MG/3ML IN SOLN
RESPIRATORY_TRACT | Status: AC
  Filled 2023-07-23: qty 3

## 2023-07-23 MED ORDER — PREDNISONE 20 MG PO TABS: 60.0000 mg | ORAL_TABLET | Freq: Every day | ORAL | 0 refills | Status: AC

## 2023-07-23 MED ORDER — METHYLPREDNISOLONE SODIUM SUCC 125 MG IJ SOLR
125.0000 mg | Freq: Once | INTRAMUSCULAR | Status: AC
  Administered 2023-07-23: 125 mg via INTRAVENOUS
  Filled 2023-07-23: qty 2

## 2023-07-23 NOTE — Progress Notes (Signed)
Synopsis: Referred in by Lorn Junes, FNP   Subjective:   PATIENT ID: Lindsay Harris: female DOB: 01/12/1976, MRN: [id number]  Chief Complaint  Patient presents with   pulmonary consult    SOB with exertion, prod cough with clear to yellow sputum and wheezing x1y. Childhood asthma dx at age 47.     HPI Lindsay Harris is a 47 y.o female patient with a past medical history of severe persistent asthma on triple therapy presenting to the pulmonary clinic today with complaints of shortness of breath.   She has had asthma for the past 32 years with multiple exacerbations one of them at age 25 requiring intubation. She has been feeling short of breath for the past month that has been woresening but hasn't seen anyone due to insurance issues. She has been on symbicort 80-4.5 2 puffs bid and spiriva for the past 6 months. She s been using her albuterol on a daily basis with minimal help. She is unable to complete her phrases, unable to exert herself and her sleep has been minimal.    FH: Strong family history of asthma in her mother and grandmother.   SH: Active smoker, smokes 1 ppd for 30 years, no alcohol use and no illicit drug use. Has 1 Dog at home.   ROS All systems were reviewed and are negative except for the above.  Objective:   Vitals:   07/23/23 1521  BP: 136/78  Pulse: 73  Temp: 97.9 F (36.6 C)  TempSrc: Temporal  SpO2: 97%  Weight: 204 lb 3.2 oz (92.6 kg)  Height: 5\' 2"  (1.575 m)   97% on RA BMI Readings from Last 3 Encounters:  07/23/23 37.35 kg/m  06/29/23 36.91 kg/m  06/07/23 27.82 kg/m   Wt Readings from Last 3 Encounters:  07/23/23 204 lb 3.2 oz (92.6 kg)  06/29/23 201 lb 12.8 oz (91.5 kg)  06/07/23 152 lb 1.9 oz (69 kg)    Physical Exam GEN: Moderate respiratory distress with accessory muscle use.  HEENT: Supple Neck, Reactive Pupils, EOMI  CVS: Normal S1, Normal S2, RRR, No murmurs or ES appreciated  Lungs: Diffuse wheezing, accessory muscle  use.  Abdomen: Soft, non tender, non distended, + BS  Extremities: Warm and well perfused, No edema  Skin: No suspicious lesions appreciated  Psych: Anxious  Ancillary Information   CBC    Component Value Date/Time   WBC 8.0 06/07/2023 1437   RBC 4.45 06/07/2023 1437   HGB 14.2 06/07/2023 1437   HCT 42.7 06/07/2023 1437   PLT 353 06/07/2023 1437   MCV 96.0 06/07/2023 1437   MCH 31.9 06/07/2023 1437   MCHC 33.3 06/07/2023 1437   RDW 13.4 06/07/2023 1437   LYMPHSABS 2.4 08/23/2022 1038   MONOABS 0.7 08/23/2022 1038   EOSABS 0.1 08/23/2022 1038   BASOSABS 0.0 08/23/2022 1038        No data to display           Assessment & Plan:  Lindsay Harris is a 47 y.o female patient with a past medical history of severe persistent asthma on triple therapy presenting to the pulmonary clinic today with complaints of shortness of breath.   #Severe persistent asthma with acute exacerbation (unclear instigating factor at this time).   On encounter today, she is in acute asthma exacerbation with moderate respiratory distress, diffuse wheezing, inability to complete phrases and accessory muscle use.   []  High risk with history of intubation in the past. Will refer to the  ER for IV steroids, IV magnesium, nebulized treatments and monitoring.  []  To discharge on increase dose of budesonide-formoterol [Symbicort] 160-4.5 2 puffs bid.  []  To discharge on Spiriva Handihaler 2 puffs daily.  []  Will assess need for biologics on follow up visit.   No follow-ups on file.  I spent minutes caring for this patient today, including preparing to see the patient, obtaining a medical history , reviewing a separately obtained history, performing a medically appropriate examination and/or evaluation, documenting clinical information in the electronic health record, and independently interpreting results (not separately reported/billed) and communicating results to the  patient/family/caregiver  Janann Colonel, MD [place] [facility] 07/23/2023 3:45 PM

## 2023-07-23 NOTE — ED Notes (Signed)
EDP at bedside  

## 2023-07-23 NOTE — ED Notes (Signed)
Pt states that feels better after breathing treatments and IV mag.

## 2023-07-23 NOTE — ED Notes (Signed)
Pt SOB at this time. Pt having a hard time speaking. Stated she feels as if she can't take a full breath.

## 2023-07-23 NOTE — ED Notes (Signed)
Critical pH reported by RT. MD made aware.

## 2023-07-23 NOTE — ED Triage Notes (Signed)
Pt brought over by pulmonary for asthma. Pt with wheezes bilateral. Pt given 1 duoneb in triage.

## 2023-07-23 NOTE — ED Provider Notes (Signed)
[facility] Provider Note    Event Date/Time   First MD Initiated Contact with Patient 07/23/23 1621     (approximate)   History   Chief Complaint Shortness of Breath   HPI  Lindsay Harris is a 47 y.o. female with past medical history of asthma who presents to the ED complaining shortness of breath.  Patient reports that she has had mild difficulty breathing for about the past week with a cough productive of yellow sputum and some burning discomfort in her chest.  She has been using her inhaler at home regularly with partial relief.  She went to establish care with pulmonology today and was referred to the ED for further evaluation after her breathing got acutely worse.  She states that she has dealt with asthma since she was a child, required intubation at age 80 but has not required recent hospitalization.     Physical Exam   Triage Vital Signs: ED Triage Vitals  Encounter Vitals Group     BP 07/23/23 1610 116/79     Systolic BP Percentile --      Diastolic BP Percentile --      Pulse Rate 07/23/23 1610 71     Resp 07/23/23 1610 (!) 22     Temp --      Temp src --      SpO2 07/23/23 1610 96 %     Weight --      Height --      Head Circumference --      Peak Flow --      Pain Score 07/23/23 1611 0     Pain Loc --      Pain Education --      Exclude from Growth Chart --     Most recent vital signs: Vitals:   07/23/23 1610 07/23/23 1625  BP: 116/79   Pulse: 71 68  Resp: (!) 22 18  SpO2: 96% 100%    Constitutional: Alert and oriented. Eyes: Conjunctivae are normal. Head: Atraumatic. Nose: No congestion/rhinnorhea. Mouth/Throat: Mucous membranes are moist.  Cardiovascular: Normal rate, regular rhythm. Grossly normal heart sounds.  2+ radial pulses bilaterally. Respiratory: Tachypneic with increased respiratory effort, expiratory wheezing noted throughout. Gastrointestinal: Soft and nontender. No distention. Musculoskeletal: No lower  extremity tenderness nor edema.  Neurologic:  Normal speech and language. No gross focal neurologic deficits are appreciated.    ED Results / Procedures / Treatments   Labs (all labs ordered are listed, but only abnormal results are displayed) Labs Reviewed  BLOOD GAS, VENOUS - Abnormal; Notable for the following components:      Result Value   pH, Ven 7.65 (*)    pCO2, Ven 21 (*)    Acid-Base Excess 4.1 (*)    All other components within normal limits  BASIC METABOLIC PANEL - Abnormal; Notable for the following components:   CO2 20 (*)    Glucose, Bld 106 (*)    All other components within normal limits  CBC WITH DIFFERENTIAL/PLATELET  POC URINE PREG, ED  TROPONIN I (HIGH SENSITIVITY)     EKG  ED ECG REPORT I, Chesley Noon, the attending physician, personally viewed and interpreted this ECG.   Date: 07/23/2023  EKG Time: 16:25  Rate: 63  Rhythm: normal sinus rhythm  Axis: Normal  Intervals:none  ST&T Change: None  RADIOLOGY Chest x-ray reviewed and interpreted by me with no infiltrate, edema, or effusion.  PROCEDURES:  Critical Care performed: No  Procedures  MEDICATIONS ORDERED IN ED: Medications  ipratropium-albuterol (DUONEB) 0.5-2.5 (3) MG/3ML nebulizer solution (  Given by Other 07/23/23 1615)  methylPREDNISolone sodium succinate (SOLU-MEDROL) 125 mg/2 mL injection 125 mg (125 mg Intravenous Given 07/23/23 1651)  ipratropium-albuterol (DUONEB) 0.5-2.5 (3) MG/3ML nebulizer solution 6 mL (6 mLs Nebulization Given 07/23/23 1641)  magnesium sulfate IVPB 2 g 50 mL (0 g Intravenous Stopped 07/23/23 1741)     IMPRESSION / MDM / ASSESSMENT AND PLAN / ED COURSE  I reviewed the triage vital signs and the nursing notes.                              46 y.o. female with past medical history of asthma who presents to the ED with part of cough, burning chest pain, and increasing difficulty breathing for the past week, acutely worse earlier today.  Patient's  presentation is most consistent with acute presentation with potential threat to life or bodily function.  Differential diagnosis includes, but is not limited to, asthma exacerbation, CHF, pneumonia, COPD, ACS, PE, anemia, electrolyte abnormality, AKI.  Patient ill-appearing with increased respiratory effort and expiratory wheezing throughout, currently maintaining oxygen saturations at 100% on room air.  EKG shows no evidence of arrhythmia or ischemia, will treat apparent asthma exacerbation with DuoNebs, IV Solu-Medrol, and IV magnesium.  Labs and chest x-ray are pending at this time, will also check VBG.  Labs without significant anemia, leukocytosis, tract abnormality, or AKI.  VBG is reassuring and shows hyperventilation with respiratory alkalosis and decreased pCO2.  Troponin within normal limits and I doubt ACS or PE.  Patient's work of breathing much improved with resolution of wheezing on reassessment.  Patient appropriate for discharge home with pulmonary follow-up, will start on course of steroids but she tells me she has plenty left on her rescue inhaler.  She was counseled to start other inhalers as prescribed by pulmonary and to return to the ED for new or worsening symptoms.  Patient agrees with plan.      FINAL CLINICAL IMPRESSION(S) / ED DIAGNOSES   Final diagnoses:  Exacerbation of persistent asthma, unspecified asthma severity     Rx / DC Orders   ED Discharge Orders          Ordered    predniSONE (DELTASONE) 20 MG tablet  Daily with breakfast        07/23/23 1812             Note:  This document was prepared using Dragon voice recognition software and may include unintentional dictation errors.   Chesley Noon, MD 07/23/23 [phone]

## 2023-07-24 ENCOUNTER — Other Ambulatory Visit: Payer: Self-pay | Admitting: Obstetrics

## 2023-07-24 ENCOUNTER — Ambulatory Visit (INDEPENDENT_AMBULATORY_CARE_PROVIDER_SITE_OTHER): Payer: Medicaid Other

## 2023-07-24 DIAGNOSIS — N83209 Unspecified ovarian cyst, unspecified side: Secondary | ICD-10-CM

## 2023-07-24 DIAGNOSIS — F17200 Nicotine dependence, unspecified, uncomplicated: Secondary | ICD-10-CM

## 2023-07-24 DIAGNOSIS — R102 Pelvic and perineal pain: Secondary | ICD-10-CM | POA: Diagnosis not present

## 2023-07-24 DIAGNOSIS — N898 Other specified noninflammatory disorders of vagina: Secondary | ICD-10-CM

## 2023-07-24 DIAGNOSIS — F526 Dyspareunia not due to a substance or known physiological condition: Secondary | ICD-10-CM

## 2023-07-24 DIAGNOSIS — N941 Unspecified dyspareunia: Secondary | ICD-10-CM

## 2023-07-24 DIAGNOSIS — N83201 Unspecified ovarian cyst, right side: Secondary | ICD-10-CM

## 2023-07-26 ENCOUNTER — Other Ambulatory Visit: Payer: Self-pay

## 2023-07-26 ENCOUNTER — Encounter: Payer: Self-pay | Admitting: Gastroenterology

## 2023-07-26 ENCOUNTER — Telehealth: Payer: Self-pay

## 2023-07-26 ENCOUNTER — Ambulatory Visit (INDEPENDENT_AMBULATORY_CARE_PROVIDER_SITE_OTHER): Payer: Medicaid Other | Admitting: Gastroenterology

## 2023-07-26 VITALS — BP 120/73 | HR 62 | Temp 98.4°F | Ht 62.0 in | Wt 205.2 lb

## 2023-07-26 DIAGNOSIS — K625 Hemorrhage of anus and rectum: Secondary | ICD-10-CM

## 2023-07-26 DIAGNOSIS — Z800 Family history of malignant neoplasm of digestive organs: Secondary | ICD-10-CM

## 2023-07-26 DIAGNOSIS — K5909 Other constipation: Secondary | ICD-10-CM | POA: Diagnosis not present

## 2023-07-26 MED ORDER — NA SULFATE-K SULFATE-MG SULF 17.5-3.13-1.6 GM/177ML PO SOLN: 354.0000 mL | Freq: Once | ORAL | 0 refills | Status: AC

## 2023-07-26 NOTE — Telephone Encounter (Signed)
Received surgical assessment request [facility]. Patient is scheduled for colonoscopy 08/09/2023.patient is scheduled for f/u 08/06/2023.  Lm to confirm appt with patient.   Dr. Larinda Buttery, on 8/19, please add your assessment to your note. Thanks

## 2023-07-26 NOTE — Patient Instructions (Addendum)
Gave Ibsrela samples for IBS constipation. Please take 1 tablet twice a day. Please let us know how it works for you and we can call you in a prescription.

## 2023-07-26 NOTE — Progress Notes (Signed)
Arlyss Repress, MD [address]  [address]  [place], [place] [address]  Main: [phone]  Fax: [phone]    Gastroenterology Consultation  Referring Provider:     Lorn Junes, FNP Primary Care Physician:  Lorn Junes, FNP Primary Gastroenterologist:  Dr. Arlyss Repress Reason for Consultation: Chronic constipation, rectal bleeding        HPI:   Lindsay Harris is a 48 y.o. female referred by Lorn Junes, FNP  for consultation & management of rectal bleeding, chronic constipation.  Patient reports that she has been suffering from constipation for several years associated with severe abdominal pain and bloating.  She has tried nonstimulant laxatives which do not help move bowels, stimulant laxatives resulted in severe abdominal cramps and doubling over.  She also reports intermittent bright red blood per rectum.  Her mother was diagnosed with colon cancer at age 10 her uncle had colon cancer in his 85s.  She does state incorporating dietary fiber as well as adequate intake of water.  No known history of anemia She does smoke tobacco  NSAIDs: None  Antiplts/Anticoagulants/Anti thrombotics: None  GI Procedures: None  History reviewed. No pertinent past medical history.  History reviewed. No pertinent surgical history.   Current Outpatient Medications:    albuterol (PROVENTIL) (2.5 MG/3ML) 0.083% nebulizer solution, Take 2.5 mg by nebulization every 4 (four) hours as needed., Disp: , Rfl:    albuterol (VENTOLIN HFA) 108 (90 Base) MCG/ACT inhaler, Inhale 2 puffs into the lungs every 6 (six) hours as needed., Disp: , Rfl:    EPINEPHrine 0.3 mg/0.3 mL IJ SOAJ injection, Inject 0.3 mg into the muscle as needed for anaphylaxis., Disp: , Rfl:    meloxicam (MOBIC) 15 MG tablet, Take 15 mg by mouth daily., Disp: , Rfl:    methocarbamol (ROBAXIN) 500 MG tablet, Take 500 mg by mouth every 8 (eight) hours as needed., Disp: , Rfl:    montelukast (SINGULAIR) 10 MG  tablet, Take 10 mg by mouth at bedtime., Disp: , Rfl:    Na Sulfate-K Sulfate-Mg Sulf 17.5-3.13-1.6 GM/177ML SOLN, Take 354 mLs by mouth once for 1 dose., Disp: 354 mL, Rfl: 0   ondansetron (ZOFRAN) 4 mg TABS tablet, Take by mouth every 8 (eight) hours as needed., Disp: , Rfl:    pantoprazole (PROTONIX) 40 MG tablet, Take 40 mg by mouth daily., Disp: , Rfl:    predniSONE (DELTASONE) 20 MG tablet, Take 3 tablets (60 mg total) by mouth daily with breakfast for 5 days., Disp: 15 tablet, Rfl: 0   propranolol ER (INDERAL LA) 80 MG 24 hr capsule, Take 80 mg by mouth daily., Disp: , Rfl:    sertraline (ZOLOFT) 100 MG tablet, Take 100 mg by mouth daily., Disp: , Rfl:    sertraline (ZOLOFT) 50 MG tablet, Take 50 mg by mouth daily., Disp: , Rfl:    SPIRIVA HANDIHALER 18 MCG inhalation capsule, Place 18 mcg into inhaler and inhale daily., Disp: , Rfl:    SUMAtriptan (IMITREX) 25 MG tablet, Take 25 mg by mouth every 2 (two) hours as needed., Disp: , Rfl:    SYMBICORT 80-4.5 MCG/ACT inhaler, Inhale 2 puffs into the lungs., Disp: , Rfl:    triamcinolone cream (KENALOG) 0.1 %, Apply topically 2 (two) times daily., Disp: , Rfl:    Family History  Problem Relation Age of Onset   Breast cancer Maternal Grandmother      Social History   Tobacco Use   Smoking  status: Every Day    Current packs/day: 1.00    Types: Cigarettes   Tobacco comments:    Has smoked since age 40  Substance Use Topics   Alcohol use: No   Drug use: No    Allergies as of 07/26/2023 - Review Complete 07/26/2023  Allergen Reaction Noted   Amoxicillin Anaphylaxis and Hives 03/30/2014   Morphine Hives, Itching, Nausea Only, and Shortness Of Breath 05/25/2023    Review of Systems:    All systems reviewed and negative except where noted in HPI.   Physical Exam:  BP 120/73 (BP Location: Left Arm, Patient Position: Sitting, Cuff Size: Normal)   Pulse 62   Temp 98.4 F (36.9 C) (Oral)   Ht 5\' 2"  (1.575 m)   Wt 205 lb 4 oz (93.1  kg)   LMP 07/16/2023 (Exact Date)   BMI 37.54 kg/m  Patient's last menstrual period was 07/16/2023 (exact date).  General:   Alert,  Well-developed, well-nourished, pleasant and cooperative in NAD Head:  Normocephalic and atraumatic. Eyes:  Sclera clear, no icterus.   Conjunctiva pink. Ears:  Normal auditory acuity. Nose:  No deformity, discharge, or lesions. Mouth:  No deformity or lesions,oropharynx pink & moist. Neck:  Supple; no masses or thyromegaly. Lungs:  Respirations even and unlabored.  Clear throughout to auscultation.   No wheezes, crackles, or rhonchi. No acute distress. Heart:  Regular rate and rhythm; no murmurs, clicks, rubs, or gallops. Abdomen:  Normal bowel sounds. Soft, non-tender and diffusely, moderately distended, tympanic to percussion without masses, hepatosplenomegaly or hernias noted.  No guarding or rebound tenderness.   Rectal: Not performed Msk:  Symmetrical without gross deformities. Good, equal movement & strength bilaterally. Pulses:  Normal pulses noted. Extremities:  No clubbing or edema.  No cyanosis. Neurologic:  Alert and oriented x3;  grossly normal neurologically. Skin:  Intact without significant lesions or rashes. No jaundice. Psych:  Alert and cooperative. Normal mood and affect.  Imaging Studies: Reviewed  Assessment and Plan:   Lindsay Harris is a 47 y.o. female with history of chronic tobacco use is seen in consultation for chronic constipation with abdominal pain and rectal bleeding, strong family history of colon cancer below 50 years of age  Trial of IBS Rela, samples provided Recommend colonoscopy with 2-day prep Discussed with patient regarding outpatient hemorrhoid ligation based on the colonoscopy results  Follow up after above workup   Arlyss Repress, MD

## 2023-07-30 ENCOUNTER — Ambulatory Visit: Payer: Medicaid Other | Admitting: Obstetrics & Gynecology

## 2023-07-30 ENCOUNTER — Encounter: Payer: Self-pay | Admitting: Obstetrics & Gynecology

## 2023-07-30 VITALS — BP 100/63 | HR 73 | Ht 63.0 in | Wt 203.0 lb

## 2023-07-30 DIAGNOSIS — R102 Pelvic and perineal pain: Secondary | ICD-10-CM

## 2023-07-30 DIAGNOSIS — N941 Unspecified dyspareunia: Secondary | ICD-10-CM

## 2023-07-30 DIAGNOSIS — N946 Dysmenorrhea, unspecified: Secondary | ICD-10-CM

## 2023-07-30 DIAGNOSIS — G8929 Other chronic pain: Secondary | ICD-10-CM

## 2023-07-30 MED ORDER — ORILISSA 200 MG PO TABS: 1.0000 | ORAL_TABLET | Freq: Two times a day (BID) | ORAL | 6 refills | Status: DC

## 2023-07-30 NOTE — Telephone Encounter (Signed)
Noted.  Patient is aware to keep 08/06/2023 appt. Nothing further needed.

## 2023-07-30 NOTE — Progress Notes (Signed)
    GYNECOLOGY PROGRESS NOTE  Subjective:    Patient ID: Lindsay Harris, female    DOB: December 22, 1975, 47 y.o.   MRN: [id number]  HPI  Patient is a 47 y.o. married P2 who presents with 2 major issues. She reports menarche at 47 years of age. Her first period was so painful that her parents took her to the ED. She says that all of her periods since then have been excruciating. She has tried many OTC meds with no relief. When she has taken prescription pain meds for other issues her pelvic pain was not improved greatly. She says that the pain is all month long but much worse with her periods. She took depo provera in the past for contraception but she remembers still having her pelvic pain then.  She has seen a GI recently and was recently started on IBS med. Her constipation has improved but her pelvic pain is still present.  Her pain is so bad that she has not had actual vaginal penetrative sex for more than a year.   Her other issue is that of having 2 periods per month for the last few months. Of note, she had a BTL in the distant past. An ultrasound done recently showed normal ovaries and endometrium measuring 9.53 mm  The following portions of the patient's history were reviewed and updated as appropriate: allergies, current medications, past family history, past medical history, past social history, past surgical history, and problem list.  Review of Systems Pertinent items are noted in HPI.  I was not able to see a pap smear result in EPIC. And we looked through her patient portal for [facility] and found no pap smear there. She went for about 16 years with no insurance and didn't get regular health maintenance during that time. Her history is significant for a SA in the past.  Objective:   Blood pressure 100/63, pulse 73, height 5\' 3"  (1.6 m), weight 203 lb (92.1 kg), last menstrual period 07/16/2023. Body mass index is 35.96 kg/m.  Well nourished, well hydrated White female, no  apparent distress She is ambulating and conversing normally. Pelvic exam deferred at this time Paula Compton saw her last week and attempted to do a single digital exam but the patient found this to be too painful (Please see her note from that visit).   Assessment:   Chronic pelvic pain, severe dysmenorrhea, dyspareunia AUB  Plan:   She already has an appt with Dr. Logan Bores scheduled next Monday to discuss the bleeding. With regard to the pain, endometriosis is at the top of my differential diagnosis based on her history. She cannot take OCPs due to her smoking, has already tried depo provera.  I have prescribed Orilissa 200 mg BID. This may take several weeks/a month to show improvement and she understands this. She may benefit from a tricyclic in the future to help manage her chronic pain. I explained that I don't believe that I can make her pain-free but that I am hopeful that her pain level can be reduced/managed.  We spent about 40 minutes in this encounter.

## 2023-07-31 ENCOUNTER — Encounter: Payer: Self-pay | Admitting: Obstetrics and Gynecology

## 2023-07-31 ENCOUNTER — Encounter: Payer: Self-pay | Admitting: Gastroenterology

## 2023-07-31 ENCOUNTER — Telehealth: Payer: Self-pay

## 2023-07-31 MED ORDER — IBSRELA 50 MG PO TABS: 1.0000 | ORAL_TABLET | Freq: Two times a day (BID) | ORAL | 5 refills | Status: DC

## 2023-07-31 NOTE — Telephone Encounter (Signed)
Submitted PA through cover my meds for the Ibsrela waiting on repsonse from insurance company

## 2023-07-31 NOTE — Telephone Encounter (Signed)
Was Given [place]

## 2023-08-01 ENCOUNTER — Encounter: Payer: Self-pay | Admitting: Gastroenterology

## 2023-08-01 MED ORDER — LINACLOTIDE 145 MCG PO CAPS: 145.0000 ug | ORAL_CAPSULE | Freq: Every day | ORAL | 5 refills | Status: DC

## 2023-08-01 NOTE — Telephone Encounter (Signed)
Patient insurance company denied the Lindsay Harris because she has to try two preferred medications before the will cover this medications. They are Amitiza, or Linzess

## 2023-08-01 NOTE — Anesthesia Preprocedure Evaluation (Addendum)
Anesthesia Evaluation  Patient identified by MRN, date of birth, ID band Patient awake    Reviewed: Allergy & Precautions, H&P , NPO status , Patient's Chart, lab work & pertinent test results  Airway Mallampati: I  TM Distance: >3 FB Neck ROM: Full    Dental no notable dental hx. (+) Edentulous Upper, Edentulous Lower   Pulmonary shortness of breath, asthma , Current Smoker and Patient abstained from smoking. Hx "severe persistent asthma"  Office notes 08-06-23 Dr. Janann Colonel  47 y.o female patient with a past medical history of severe persistent asthma on triple therapy presenting to the pulmonary clinic today for follow up on her severe persistent asthma.    I last saw earlier in August 2024 for asthma and she seemed to be in an asthma exacerbation. Referred to the ED and was managed with IV steroids and discharged on 5 days prednisone. She did better and presents today for a follow up visit.    She has had asthma for the past 32 years with multiple exacerbations one of them at age 61 requiring intubation. She has been feeling short of breath for the past month that has been woresening but hasn't seen anyone due to insurance issues. She has been on symbicort 80-4.5 2 puffs bid and spiriva for the past 6 months.    She reports feeling much better however still has some exertional shortness of breath. She reports some PND and loud snoring at night. Otherwise she is being evaluated for endometriosis by her Gynecologist.    FH: Strong family history of asthma in her mother and grandmother.    SH: Active smoker, smokes 1 ppd for 30 years, no alcohol use and no illicit drug use. Has 1 Dog at home.   High prevelance in asthmatics. Reports lound snoring and apneic episodes. Overweight and increased neck circumference.  Clearance for Colonoscopy on 08/22 Lindsay Harris is low risk for post procedure pulmonary complications. Her Asthma/COPD Overlap  should not preclude her from obtaining a colonoscopy   Will give duoneb pre-procedure   + wheezing      Cardiovascular negative cardio ROS Normal cardiovascular exam Rhythm:Regular Rate:Normal     Neuro/Psych  Headaches negative neurological ROS  negative psych ROS   GI/Hepatic Neg liver ROS,GERD  ,,  Endo/Other  negative endocrine ROS    Renal/GU negative Renal ROS  negative genitourinary   Musculoskeletal negative musculoskeletal ROS (+)    Abdominal   Peds negative pediatric ROS (+)  Hematology negative hematology ROS (+)   Anesthesia Other Findings GERD (gastroesophageal reflux disease) Severe persistent Asthma, on steroids, hx intubation once aged 15 years, recent exacerbation Smoker  Dyspnea  Headache    Reproductive/Obstetrics negative OB ROS                             Anesthesia Physical Anesthesia Plan  ASA: 3  Anesthesia Plan: General   Post-op Pain Management:    Induction: Intravenous  PONV Risk Score and Plan:   Airway Management Planned: Natural Airway and Nasal Cannula  Additional Equipment:   Intra-op Plan:   Post-operative Plan:   Informed Consent: I have reviewed the patients History and Physical, chart, labs and discussed the procedure including the risks, benefits and alternatives for the proposed anesthesia with the patient or authorized representative who has indicated his/her understanding and acceptance.     Dental Advisory Given  Plan Discussed with: Anesthesiologist, CRNA and Surgeon  Anesthesia Plan Comments: (  Patient consented for risks of anesthesia including but not limited to:  - adverse reactions to medications - risk of airway placement if required - damage to eyes, teeth, lips or other oral mucosa - nerve damage due to positioning  - sore throat or hoarseness - Damage to heart, brain, nerves, lungs, other parts of body or loss of life  Patient voiced understanding.)         Anesthesia Quick Evaluation

## 2023-08-01 NOTE — Telephone Encounter (Signed)
Called patient and patient verbalized understanding sent medication to the pharmacy

## 2023-08-01 NOTE — Addendum Note (Signed)
Addended by: Radene Knee L on: 08/01/2023 12:42 PM   Modules accepted: Orders

## 2023-08-01 NOTE — Telephone Encounter (Signed)
Let's try linzess daily and see if it helps  RV

## 2023-08-02 DIAGNOSIS — G2581 Restless legs syndrome: Secondary | ICD-10-CM | POA: Insufficient documentation

## 2023-08-06 ENCOUNTER — Encounter: Payer: Self-pay | Admitting: Pulmonary Disease

## 2023-08-06 ENCOUNTER — Ambulatory Visit: Payer: Medicaid Other | Admitting: Pulmonary Disease

## 2023-08-06 ENCOUNTER — Other Ambulatory Visit
Admission: RE | Admit: 2023-08-06 | Discharge: 2023-08-06 | Disposition: A | Discharge status: Home / Self Care | Payer: Medicaid Other | Source: Ambulatory Visit | Attending: Pulmonary Disease | Admitting: Pulmonary Disease

## 2023-08-06 VITALS — BP 120/62 | HR 80 | Temp 97.8°F | Ht 63.0 in | Wt 204.0 lb

## 2023-08-06 DIAGNOSIS — J4551 Severe persistent asthma with (acute) exacerbation: Secondary | ICD-10-CM | POA: Diagnosis present

## 2023-08-06 DIAGNOSIS — F17200 Nicotine dependence, unspecified, uncomplicated: Secondary | ICD-10-CM | POA: Diagnosis not present

## 2023-08-06 MED ORDER — AIRSUPRA 90-80 MCG/ACT IN AERO
2.0000 | INHALATION_SPRAY | Freq: Four times a day (QID) | RESPIRATORY_TRACT | 3 refills | Status: DC
Start: 2023-08-06 — End: 2023-11-06

## 2023-08-06 MED ORDER — BUDESONIDE-FORMOTEROL FUMARATE 160-4.5 MCG/ACT IN AERO: 2.0000 | INHALATION_SPRAY | Freq: Two times a day (BID) | RESPIRATORY_TRACT | 12 refills | Status: DC

## 2023-08-06 MED ORDER — NICOTINE 14 MG/24HR TD PT24: 14.0000 mg | MEDICATED_PATCH | TRANSDERMAL | 0 refills | Status: DC

## 2023-08-06 MED ORDER — NICOTINE POLACRILEX 2 MG MT LOZG
2.0000 mg | LOZENGE | OROMUCOSAL | 0 refills | Status: DC | PRN
Start: 2023-08-06 — End: 2023-12-27

## 2023-08-06 NOTE — Progress Notes (Signed)
Synopsis: Referred in by No ref. provider found   Subjective:   PATIENT ID: Lindsay Harris GENDER: female DOB: [birth year], MRN: [id number]  Chief Complaint  Patient presents with   Follow-up    Breathing has improved since last OV.  SOB with exertion and prod cough with clear sputum    HPI Lindsay Harris is a 47 y.o female patient with a past medical history of severe persistent asthma on triple therapy presenting to the pulmonary clinic today for follow up on her severe persistent asthma.   I last saw earlier in August 2024 for asthma and she seemed to be in an asthma exacerbation. Referred to the ED and was managed with IV steroids and discharged on 5 days prednisone. She did better and presents today for a follow up visit.   She has had asthma for the past 32 years with multiple exacerbations one of them at age 31 requiring intubation. She has been feeling short of breath for the past month that has been woresening but hasn't seen anyone due to insurance issues. She has been on symbicort 80-4.5 2 puffs bid and spiriva for the past 6 months.   She reports feeling much better however still has some exertional shortness of breath. She reports some PND and loud snoring at night. Otherwise she is being evaluated for endometriosis by her Gynecologist.    FH: Strong family history of asthma in her mother and grandmother.   SH: Active smoker, smokes 1 ppd for 30 years, no alcohol use and no illicit drug use. Has 1 Dog at home.   ROS All systems were reviewed and are negative except for the above.  Objective:   Vitals:   08/06/23 1444  BP: 120/62  Pulse: 80  Temp: 97.8 F (36.6 C)  TempSrc: Temporal  SpO2: 95%  Weight: 204 lb (92.5 kg)  Height: 5\' 3"  (1.6 m)   95% on RA BMI Readings from Last 3 Encounters:  08/06/23 36.14 kg/m  07/30/23 35.96 kg/m  07/26/23 37.54 kg/m   Wt Readings from Last 3 Encounters:  08/06/23 204 lb (92.5 kg)  07/30/23 203 lb (92.1 kg)  07/26/23 205  lb 4 oz (93.1 kg)    Physical Exam GEN: Moderate respiratory distress with accessory muscle use.  HEENT: Supple Neck, Reactive Pupils, EOMI  CVS: Normal S1, Normal S2, RRR, No murmurs or ES appreciated  Lungs: Inspiratory wheezing heard diffusely. No rales/ronchi.   Abdomen: Soft, non tender, non distended, + BS  Extremities: Warm and well perfused, No edema  Skin: No suspicious lesions appreciated  Psych: Good affect.   Ancillary Information   CBC    Component Value Date/Time   WBC 9.7 07/23/2023 1653   RBC 4.63 07/23/2023 1653   HGB 14.5 07/23/2023 1653   HCT 42.3 07/23/2023 1653   PLT 332 07/23/2023 1653   MCV 91.4 07/23/2023 1653   MCH 31.3 07/23/2023 1653   MCHC 34.3 07/23/2023 1653   RDW 13.4 07/23/2023 1653   LYMPHSABS 4.0 07/23/2023 1653   MONOABS 0.6 07/23/2023 1653   EOSABS 0.3 07/23/2023 1653   BASOSABS 0.1 07/23/2023 1653        No data to display         Labs and imaging were reviewed.   Assessment & Plan:  [name] is a 47 y.o female patient with a past medical history of severe persistent asthma on triple therapy presenting to the pulmonary clinic today for further management of her asthma.   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  []  Continue with Spiriva Handihaler 2 puffs daily.  []  Start Budesonide-Albuterol 80-93mcg [Airsupra] 2 puffs Q6H for wheezing or shortness of breath.  []  Refer to pulmonary rehab  []  Obtain Chest CT for ongoing shortness of breath and inspiratory wheezing. []  PFTs  []  Allergen Panel  []  Will assess need for biologics on follow up visit.   #Suspecting OSA High prevelance in asthmatics. Reports lound snoring and apneic episodes. Overweight and increased neck circumference.   []  In-lab sleep study.   #Tobacco use disorder  Discussed the importance of smoking cessation and dramatic impact on copd progression. She is  ready to stop and has decided on Monday next week. Provided NRT.   []  CT chest wo contrast  []  Will try to qualify her for lung cancer screening.   Return in about 3 months (around 11/06/2023).  I spent minutes caring for this patient today, including preparing to see the patient, obtaining a medical history , reviewing a separately obtained history, performing a medically appropriate examination and/or evaluation, documenting clinical information in the electronic health record, and independently interpreting results (not separately reported/billed) and communicating results to the patient/family/caregiver  Lindsay Colonel, MD [facility] 08/06/2023 3:16 PM

## 2023-08-07 ENCOUNTER — Encounter: Payer: Self-pay | Admitting: Obstetrics and Gynecology

## 2023-08-07 ENCOUNTER — Ambulatory Visit (INDEPENDENT_AMBULATORY_CARE_PROVIDER_SITE_OTHER): Payer: Medicaid Other | Admitting: Obstetrics and Gynecology

## 2023-08-07 VITALS — BP 116/78 | HR 67 | Ht 63.0 in | Wt 205.3 lb

## 2023-08-07 DIAGNOSIS — R102 Pelvic and perineal pain: Secondary | ICD-10-CM

## 2023-08-07 DIAGNOSIS — G8929 Other chronic pain: Secondary | ICD-10-CM

## 2023-08-07 DIAGNOSIS — N946 Dysmenorrhea, unspecified: Secondary | ICD-10-CM | POA: Diagnosis not present

## 2023-08-07 DIAGNOSIS — N939 Abnormal uterine and vaginal bleeding, unspecified: Secondary | ICD-10-CM | POA: Diagnosis not present

## 2023-08-07 DIAGNOSIS — N941 Unspecified dyspareunia: Secondary | ICD-10-CM

## 2023-08-07 DIAGNOSIS — N83209 Unspecified ovarian cyst, unspecified side: Secondary | ICD-10-CM | POA: Diagnosis not present

## 2023-08-07 DIAGNOSIS — J4489 Other specified chronic obstructive pulmonary disease: Secondary | ICD-10-CM | POA: Insufficient documentation

## 2023-08-07 NOTE — Progress Notes (Signed)
HPI:      Lindsay Harris is a 47 y.o. No obstetric history on file. who LMP was Patient's last menstrual period was 07/30/2023 (exact date).  Subjective:   She presents today to discuss some of her test results and findings.  She has been experiencing irregular bleeding sometimes 3 times per month as well as some pelvic pain/discomfort.  She underwent an ultrasound showing normal physiologic cysts.  For some reason she got a Roma score performed which showed an elevated He4.  This increased her premenopausal scoring to abnormal. Of significant note patient smokes cigarettes. At her last visit Dr. Marice Potter started her on Orilissa for presumptive endometriosis to help her pain and her irregular bleeding.  Patient has not taking 2 days of Orilissa. Patient has a tubal ligation for birth control.    Hx: The following portions of the patient's history were reviewed and updated as appropriate:             She  has a past medical history of Asthma, Dyspnea, GERD (gastroesophageal reflux disease), and Headache. She does not have any pertinent problems on file. She  has no past surgical history on file. Her family history includes Breast cancer in her maternal grandmother; Cancer in her maternal uncle; Cancer (age of onset: 80) in her mother; Cancer (age of onset: 32) in her maternal grandmother. She  reports that she has been smoking cigarettes. She does not have any smokeless tobacco history on file. She reports that she does not drink alcohol and does not use drugs. She has a current medication list which includes the following prescription(s): albuterol, albuterol, airsupra, budesonide-formoterol, orilissa, epinephrine, linaclotide, methocarbamol, montelukast, nicotine, nicotine polacrilex, ondansetron, pantoprazole, propranolol er, sertraline, sertraline, spiriva handihaler, sumatriptan, symbicort, and triamcinolone cream. She is allergic to amoxicillin and morphine.       Review of Systems:   Review of Systems  Constitutional: Denied constitutional symptoms, night sweats, recent illness, fatigue, fever, insomnia and weight loss.  Eyes: Denied eye symptoms, eye pain, photophobia, vision change and visual disturbance.  Ears/Nose/Throat/Neck: Denied ear, nose, throat or neck symptoms, hearing loss, nasal discharge, sinus congestion and sore throat.  Cardiovascular: Denied cardiovascular symptoms, arrhythmia, chest pain/pressure, edema, exercise intolerance, orthopnea and palpitations.  Respiratory: Denied pulmonary symptoms, asthma, pleuritic pain, productive sputum, cough, dyspnea and wheezing.  Gastrointestinal: Denied, gastro-esophageal reflux, melena, nausea and vomiting.  Genitourinary: See HPI for additional information.  Musculoskeletal: Denied musculoskeletal symptoms, stiffness, swelling, muscle weakness and myalgia.  Dermatologic: Denied dermatology symptoms, rash and scar.  Neurologic: Denied neurology symptoms, dizziness, headache, neck pain and syncope.  Psychiatric: Denied psychiatric symptoms, anxiety and depression.  Endocrine: Denied endocrine symptoms including hot flashes and night sweats.   Meds:   Current Outpatient Medications on File Prior to Visit  Medication Sig Dispense Refill   albuterol (PROVENTIL) (2.5 MG/3ML) 0.083% nebulizer solution Take 2.5 mg by nebulization every 4 (four) hours as needed.     albuterol (VENTOLIN HFA) 108 (90 Base) MCG/ACT inhaler Inhale 2 puffs into the lungs every 6 (six) hours as needed.     Albuterol-Budesonide (AIRSUPRA) 90-80 MCG/ACT AERO Inhale 2 puffs into the lungs every 6 (six) hours. 1 g 3   budesonide-formoterol (SYMBICORT) 160-4.5 MCG/ACT inhaler Inhale 2 puffs into the lungs in the morning and at bedtime. 1 each 12   Elagolix Sodium (ORILISSA) 200 MG TABS Take 1 tablet (200 mg total) by mouth 2 (two) times daily. 60 tablet 6   EPINEPHrine 0.3 mg/0.3 mL IJ SOAJ  injection Inject 0.3 mg into the muscle as needed for  anaphylaxis.     linaclotide (LINZESS) 145 MCG CAPS capsule Take 1 capsule (145 mcg total) by mouth daily before breakfast. 30 capsule 5   methocarbamol (ROBAXIN) 500 MG tablet Take 500 mg by mouth every 8 (eight) hours as needed.     montelukast (SINGULAIR) 10 MG tablet Take 10 mg by mouth at bedtime.     nicotine (NICODERM CQ - DOSED IN MG/24 HOURS) 14 mg/24hr patch Place 1 patch (14 mg total) onto the skin daily. 30 patch 0   nicotine polacrilex (NICOTINE MINI) 2 MG lozenge Take 1 lozenge (2 mg total) by mouth as needed for smoking cessation. 100 tablet 0   ondansetron (ZOFRAN) 4 mg TABS tablet Take by mouth every 8 (eight) hours as needed.     pantoprazole (PROTONIX) 40 MG tablet Take 40 mg by mouth daily.     propranolol ER (INDERAL LA) 80 MG 24 hr capsule Take 80 mg by mouth daily.     sertraline (ZOLOFT) 100 MG tablet Take 100 mg by mouth daily.     sertraline (ZOLOFT) 50 MG tablet Take 50 mg by mouth daily.     SPIRIVA HANDIHALER 18 MCG inhalation capsule Place 18 mcg into inhaler and inhale daily.     SUMAtriptan (IMITREX) 25 MG tablet Take 25 mg by mouth every 2 (two) hours as needed.     SYMBICORT 80-4.5 MCG/ACT inhaler Inhale 2 puffs into the lungs.     triamcinolone cream (KENALOG) 0.1 % Apply topically 2 (two) times daily.     No current facility-administered medications on file prior to visit.      Objective:     Vitals:   08/07/23 1344  BP: 116/78  Pulse: 67   Filed Weights   08/07/23 1344  Weight: 205 lb 4.8 oz (93.1 kg)                        Assessment:    No obstetric history on file. Patient Active Problem List   Diagnosis Date Noted   Tobacco dependence 06/29/2023   Dyspareunia in female 06/29/2023   Bilateral ovarian cysts 06/29/2023     1. Cyst of ovary, unspecified laterality   2. Dysmenorrhea   3. Chronic pelvic pain in female   4. Abnormal uterine bleeding     Ultrasound results reviewed directly with the patient.  I spent a great deal of  time discussing her pelvic pain, her benign ovarian cysts and her elevated Roma score with her.  The possibility of endometriosis was discussed.  The finding that endometriosis and smoking can sometimes elevate Roma scores was discussed.  I think is very unlikely that she has an ovarian malignancy.   Plan:            1.  As she is just started [place] I believe that a trial of her less over the next 3 months is certainly warranted.  She may well have endometriosis.  2.  Should she fail Dewayne Hatch a trial of Mirena IUD might be the next step.  All of her questions answered. Orders No orders of the defined types were placed in this encounter.   No orders of the defined types were placed in this encounter.     F/U  No follow-ups on file. I spent 31 minutes involved in the care of this patient preparing to see the patient by obtaining and reviewing her medical history (  including labs, imaging tests and prior procedures), documenting clinical information in the electronic health record (EHR), counseling and coordinating care plans, writing and sending prescriptions, ordering tests or procedures and in direct communicating with the patient and medical staff discussing pertinent items from her history and physical exam.  Elonda Husky, M.D. 08/07/2023 2:28 PM

## 2023-08-07 NOTE — Progress Notes (Signed)
Patient presents today to follow-up on lab work and recent ultrasound. She has complaints of multiple cycles per month along with spotting in between. She states starting Orlissa on 08/05/23.

## 2023-08-08 NOTE — Telephone Encounter (Signed)
Last OV note sent to [facility]. Nothing further needed

## 2023-08-09 ENCOUNTER — Ambulatory Visit: Payer: Medicaid Other | Admitting: Anesthesiology

## 2023-08-09 ENCOUNTER — Encounter
Admission: RE | Disposition: A | Discharge status: Home / Self Care | Payer: Self-pay | Source: Home / Self Care | Attending: Gastroenterology

## 2023-08-09 ENCOUNTER — Other Ambulatory Visit: Payer: Self-pay

## 2023-08-09 ENCOUNTER — Ambulatory Visit
Admission: RE | Admit: 2023-08-09 | Discharge: 2023-08-09 | Disposition: A | Discharge status: Home / Self Care | Payer: Medicaid Other | Attending: Gastroenterology | Admitting: Gastroenterology

## 2023-08-09 DIAGNOSIS — K625 Hemorrhage of anus and rectum: Secondary | ICD-10-CM

## 2023-08-09 DIAGNOSIS — Z7951 Long term (current) use of inhaled steroids: Secondary | ICD-10-CM | POA: Diagnosis not present

## 2023-08-09 DIAGNOSIS — Z1211 Encounter for screening for malignant neoplasm of colon: Secondary | ICD-10-CM | POA: Diagnosis present

## 2023-08-09 DIAGNOSIS — K219 Gastro-esophageal reflux disease without esophagitis: Secondary | ICD-10-CM | POA: Insufficient documentation

## 2023-08-09 DIAGNOSIS — J4550 Severe persistent asthma, uncomplicated: Secondary | ICD-10-CM | POA: Diagnosis not present

## 2023-08-09 DIAGNOSIS — F17210 Nicotine dependence, cigarettes, uncomplicated: Secondary | ICD-10-CM | POA: Diagnosis not present

## 2023-08-09 DIAGNOSIS — Z800 Family history of malignant neoplasm of digestive organs: Secondary | ICD-10-CM | POA: Diagnosis not present

## 2023-08-09 DIAGNOSIS — K5730 Diverticulosis of large intestine without perforation or abscess without bleeding: Secondary | ICD-10-CM | POA: Diagnosis not present

## 2023-08-09 DIAGNOSIS — D125 Benign neoplasm of sigmoid colon: Secondary | ICD-10-CM

## 2023-08-09 DIAGNOSIS — K635 Polyp of colon: Secondary | ICD-10-CM

## 2023-08-09 DIAGNOSIS — N809 Endometriosis, unspecified: Secondary | ICD-10-CM | POA: Insufficient documentation

## 2023-08-09 DIAGNOSIS — K644 Residual hemorrhoidal skin tags: Secondary | ICD-10-CM | POA: Insufficient documentation

## 2023-08-09 HISTORY — DX: Gastro-esophageal reflux disease without esophagitis: K21.9

## 2023-08-09 HISTORY — PX: COLONOSCOPY WITH PROPOFOL: SHX5780

## 2023-08-09 HISTORY — DX: Headache, unspecified: R51.9

## 2023-08-09 HISTORY — DX: Dyspnea, unspecified: R06.00

## 2023-08-09 HISTORY — PX: POLYPECTOMY: SHX5525

## 2023-08-09 HISTORY — DX: Unspecified asthma, uncomplicated: J45.909

## 2023-08-09 LAB — POCT PREGNANCY, URINE: Preg Test, Ur: NEGATIVE

## 2023-08-09 SURGERY — COLONOSCOPY WITH PROPOFOL
Anesthesia: General | Site: Rectum

## 2023-08-09 MED ORDER — LACTATED RINGERS IV SOLN: INTRAVENOUS | Status: DC

## 2023-08-09 MED ORDER — STERILE WATER FOR IRRIGATION IR SOLN
Status: DC | PRN
  Administered 2023-08-09: 100 mL

## 2023-08-09 MED ORDER — LIDOCAINE HCL (CARDIAC) PF 100 MG/5ML IV SOSY
PREFILLED_SYRINGE | INTRAVENOUS | Status: DC | PRN
  Administered 2023-08-09: 90 mg via INTRAVENOUS

## 2023-08-09 MED ORDER — ALBUTEROL SULFATE (2.5 MG/3ML) 0.083% IN NEBU
2.5000 mg | INHALATION_SOLUTION | Freq: Once | RESPIRATORY_TRACT | Status: AC
  Administered 2023-08-09: 2.5 mg via RESPIRATORY_TRACT

## 2023-08-09 MED ORDER — IPRATROPIUM-ALBUTEROL 0.5-2.5 (3) MG/3ML IN SOLN: 3.0000 mL | Freq: Once | RESPIRATORY_TRACT | Status: DC

## 2023-08-09 MED ORDER — PROPOFOL 10 MG/ML IV BOLUS
INTRAVENOUS | Status: DC | PRN
  Administered 2023-08-09 (×2): 50 mg via INTRAVENOUS
  Administered 2023-08-09: 80 mg via INTRAVENOUS
  Administered 2023-08-09: 100 mg via INTRAVENOUS
  Administered 2023-08-09 (×3): 50 mg via INTRAVENOUS

## 2023-08-09 MED ORDER — SODIUM CHLORIDE 0.9 % IV SOLN: INTRAVENOUS | Status: DC

## 2023-08-09 SURGICAL SUPPLY — 10 items
ELECT REM PT RETURN 9FT ADLT (ELECTROSURGICAL) ×1
ELECTRODE REM PT RTRN 9FT ADLT (ELECTROSURGICAL) IMPLANT
GOWN CVR UNV OPN BCK APRN NK (MISCELLANEOUS) ×2 IMPLANT
GOWN ISOL THUMB LOOP REG UNIV (MISCELLANEOUS) ×2
KIT PRC NS LF DISP ENDO (KITS) ×1 IMPLANT
KIT PROCEDURE OLYMPUS (KITS) ×1
MANIFOLD NEPTUNE II (INSTRUMENTS) ×1 IMPLANT
SNARE LASSO HEX 3 IN 1 (INSTRUMENTS) IMPLANT
TRAP ETRAP POLY (MISCELLANEOUS) IMPLANT
WATER STERILE IRR 250ML POUR (IV SOLUTION) ×1 IMPLANT

## 2023-08-09 NOTE — Transfer of Care (Signed)
Immediate Anesthesia Transfer of Care Note  Patient: Lindsay Harris  Procedure(s) Performed: COLONOSCOPY WITH BIOPSY (Rectum) POLYPECTOMY (Rectum)  Patient Location: PACU  Anesthesia Type: General  Level of Consciousness: awake, alert  and patient cooperative  Airway and Oxygen Therapy: Patient Spontanous Breathing and Patient connected to supplemental oxygen  Post-op Assessment: Post-op Vital signs reviewed, Patient's Cardiovascular Status Stable, Respiratory Function Stable, Patent Airway and No signs of Nausea or vomiting  Post-op Vital Signs: Reviewed and stable  Complications: No notable events documented.

## 2023-08-09 NOTE — Anesthesia Postprocedure Evaluation (Signed)
Anesthesia Post Note  Patient: Lindsay Harris  Procedure(s) Performed: COLONOSCOPY WITH BIOPSY (Rectum) POLYPECTOMY (Rectum)  Patient location during evaluation: PACU Anesthesia Type: General Level of consciousness: awake and alert Pain management: pain level controlled Vital Signs Assessment: post-procedure vital signs reviewed and stable Respiratory status: spontaneous breathing, nonlabored ventilation, respiratory function stable and patient connected to nasal cannula oxygen Cardiovascular status: blood pressure returned to baseline and stable Postop Assessment: no apparent nausea or vomiting Anesthetic complications: no   No notable events documented.   Last Vitals:  Vitals:   08/09/23 1410 08/09/23 1415  BP: 120/80 102/68  Pulse: 65 60  Resp: 17 15  Temp: 36.9 C 36.8 C  SpO2: 97% 97%    Last Pain:  Vitals:   08/09/23 1410  TempSrc:   PainSc: Asleep                 Camaron Cammack C Danessa Mensch

## 2023-08-09 NOTE — H&P (Signed)
Arlyss Repress, MD [address]  [address]  [place], [place] [address]  Main: [phone]  Fax: [phone] Pager: [phone]  Primary Care Physician:  Lorn Junes, FNP Primary Gastroenterologist:  Dr. Arlyss Repress  Pre-Procedure History & Physical: HPI:  Lindsay Harris is a 47 y.o. female is here for an colonoscopy.   Past Medical History:  Diagnosis Date   Asthma    Dyspnea    GERD (gastroesophageal reflux disease)    Headache    migraines    History reviewed. No pertinent surgical history.  Prior to Admission medications   Medication Sig Start Date End Date Taking? Authorizing Provider  albuterol (PROVENTIL) (2.5 MG/3ML) 0.083% nebulizer solution Take 2.5 mg by nebulization every 4 (four) hours as needed. 06/18/23  Yes [provider]  albuterol (VENTOLIN HFA) 108 (90 Base) MCG/ACT inhaler Inhale 2 puffs into the lungs every 6 (six) hours as needed. 05/03/23  Yes [provider]  budesonide-formoterol (SYMBICORT) 160-4.5 MCG/ACT inhaler Inhale 2 puffs into the lungs in the morning and at bedtime. 08/06/23  Yes Assaker, [place], MD  Elagolix Sodium (ORILISSA) 200 MG TABS Take 1 tablet (200 mg total) by mouth 2 (two) times daily. 07/30/23  Yes Allie Bossier, MD  linaclotide (LINZESS) 145 MCG CAPS capsule Take 1 capsule (145 mcg total) by mouth daily before breakfast. 08/01/23  Yes Avelynn Sellin, Loel Dubonnet, MD  methocarbamol (ROBAXIN) 500 MG tablet Take 500 mg by mouth every 8 (eight) hours as needed. 05/28/23  Yes [provider]  montelukast (SINGULAIR) 10 MG tablet Take 10 mg by mouth at bedtime. 05/03/23  Yes [provider]  ondansetron (ZOFRAN) 4 mg TABS tablet Take by mouth every 8 (eight) hours as needed. 05/03/23  Yes [provider]  pantoprazole (PROTONIX) 40 MG tablet Take 40 mg by mouth daily. 05/30/23  Yes [provider]  propranolol ER (INDERAL LA) 80 MG 24 hr capsule Take 80 mg by mouth daily. 05/28/23   Yes [provider]  sertraline (ZOLOFT) 100 MG tablet Take 100 mg by mouth daily. 01/30/23  Yes [provider]  sertraline (ZOLOFT) 50 MG tablet Take 50 mg by mouth daily. 03/12/23  Yes [provider]  SPIRIVA HANDIHALER 18 MCG inhalation capsule Place 18 mcg into inhaler and inhale daily.   Yes [provider]  SUMAtriptan (IMITREX) 25 MG tablet Take 25 mg by mouth every 2 (two) hours as needed. 05/28/23  Yes [provider]  SYMBICORT 80-4.5 MCG/ACT inhaler Inhale 2 puffs into the lungs. 05/03/23  Yes [provider]  triamcinolone cream (KENALOG) 0.1 % Apply topically 2 (two) times daily. 05/30/23  Yes [provider]  Albuterol-Budesonide (AIRSUPRA) 90-80 MCG/ACT AERO Inhale 2 puffs into the lungs every 6 (six) hours. Patient not taking: Reported on 08/09/2023 08/06/23   Janann Colonel, MD  EPINEPHrine 0.3 mg/0.3 mL IJ SOAJ injection Inject 0.3 mg into the muscle as needed for anaphylaxis. 05/29/23   [provider]  nicotine (NICODERM CQ - DOSED IN MG/24 HOURS) 14 mg/24hr patch Place 1 patch (14 mg total) onto the skin daily. Patient not taking: Reported on 08/09/2023 08/06/23 08/05/24  Janann Colonel, MD  nicotine polacrilex (NICOTINE MINI) 2 MG lozenge Take 1 lozenge (2 mg total) by mouth as needed for smoking cessation. Patient not taking: Reported on 08/09/2023 08/06/23   Janann Colonel, MD    Allergies as of 07/26/2023 - Review Complete 07/26/2023  Allergen Reaction Noted   Amoxicillin Anaphylaxis and  Hives 03/30/2014   Morphine Hives, Itching, Nausea Only, and Shortness Of Breath 05/25/2023    Family History  Problem Relation Age of Onset   Cancer Mother 17   Cancer Maternal Uncle    Cancer Maternal Grandmother 70   Breast cancer Maternal Grandmother     Social History   Socioeconomic History   Marital status: Married    Spouse name: Not on file   Number of children: Not on file   Years of  education: Not on file   Highest education level: Not on file  Occupational History   Not on file  Tobacco Use   Smoking status: Every Day    Current packs/day: 1.00    Types: Cigarettes   Smokeless tobacco: Not on file   Tobacco comments:    Has smoked since age 38  Substance and Sexual Activity   Alcohol use: No   Drug use: No   Sexual activity: Not on file    Comment: BTL  Other Topics Concern   Not on file  Social History Narrative   Not on file   Social Determinants of Health   Financial Resource Strain: Not on file  Food Insecurity: Not on file  Transportation Needs: Not on file  Physical Activity: Not on file  Stress: Not on file  Social Connections: Not on file  Intimate Partner Violence: Not on file    Review of Systems: See HPI, otherwise negative ROS  Physical Exam: BP 115/73   Temp (!) 97.1 F (36.2 C) (Temporal)   Resp 14   Ht 5\' 3"  (1.6 m)   Wt 90.1 kg   LMP 07/16/2023 (Exact Date)   SpO2 99%   BMI 35.18 kg/m  General:   Alert,  pleasant and cooperative in NAD Head:  Normocephalic and atraumatic. Neck:  Supple; no masses or thyromegaly. Lungs:  Clear throughout to auscultation.    Heart:  Regular rate and rhythm. Abdomen:  Soft, nontender and nondistended. Normal bowel sounds, without guarding, and without rebound.   Neurologic:  Alert and  oriented x4;  grossly normal neurologically.  Impression/Plan: Lindsay Harris is here for an colonoscopy to be performed for colon cancer screening, Her mother was diagnosed with colon cancer at age 8 her uncle had colon cancer in his 69s   Risks, benefits, limitations, and alternatives regarding  colonoscopy have been reviewed with the patient.  Questions have been answered.  All parties agreeable.   Lannette Donath, MD  08/09/2023, 12:23 PM

## 2023-08-09 NOTE — Op Note (Signed)
[facility] Patient Name: Lindsay Harris Procedure Date: 08/09/2023 1:37 PM MRN: [id number] Account #: [id number] Date of Birth: 1976-05-24 Admit Type: Outpatient Age: 47 Room: [facility] OR ROOM 01 Gender: Female Note Status: Finalized Instrument Name: [id number] Procedure:             Colonoscopy Indications:           Screening in patient at increased risk: Colorectal                         cancer in mother before age 14, This is the patient's                         first colonoscopy Providers:             Toney Reil MD, MD Referring MD:          Toney Reil MD, MD (Referring MD), Austin Miles.                         Okey Dupre MD, MD (Referring MD) Medicines:             General Anesthesia Complications:         No immediate complications. Estimated blood loss: None. Procedure:             Pre-Anesthesia Assessment:                        - Prior to the procedure, a History and Physical was                         performed, and patient medications and allergies were                         reviewed. The patient is competent. The risks and                         benefits of the procedure and the sedation options and                         risks were discussed with the patient. All questions                         were answered and informed consent was obtained.                         Patient identification and proposed procedure were                         verified by the physician, the nurse, the                         anesthesiologist, the anesthetist and the technician                         in the pre-procedure area in the procedure room in the                         endoscopy suite. Mental Status Examination: alert and  oriented. Airway Examination: normal oropharyngeal                         airway and neck mobility. Respiratory Examination:                         clear to auscultation. CV  Examination: normal.                         Prophylactic Antibiotics: The patient does not require                         prophylactic antibiotics. Prior Anticoagulants: The                         patient has taken no anticoagulant or antiplatelet                         agents. ASA Grade Assessment: III - A patient with                         severe systemic disease. After reviewing the risks and                         benefits, the patient was deemed in satisfactory                         condition to undergo the procedure. The anesthesia                         plan was to use general anesthesia. Immediately prior                         to administration of medications, the patient was                         re-assessed for adequacy to receive sedatives. The                         heart rate, respiratory rate, oxygen saturations,                         blood pressure, adequacy of pulmonary ventilation, and                         response to care were monitored throughout the                         procedure. The physical status of the patient was                         re-assessed after the procedure.                        After obtaining informed consent, the colonoscope was                         passed under direct vision. Throughout the procedure,  the patient's blood pressure, pulse, and oxygen                         saturations were monitored continuously. The                         Colonoscope was introduced through the anus and                         advanced to the the cecum, identified by appendiceal                         orifice and ileocecal valve. The colonoscopy was                         performed without difficulty. The patient tolerated                         the procedure well. The quality of the bowel                         preparation was evaluated using the BBPS Procedure [facility] Bowel                         Preparation Scale)  with scores of: Right Colon = 3,                         Transverse Colon = 3 and Left Colon = 3 (entire mucosa                         seen well with no residual staining, small fragments                         of stool or opaque liquid). The total BBPS score                         equals 9. The ileocecal valve, appendiceal orifice,                         and rectum were photographed. Findings:      The perianal and digital rectal examinations were normal. Pertinent       negatives include normal sphincter tone and no palpable rectal lesions.      Two semi-pedunculated polyps were found in the sigmoid colon. The polyps       were 10 to 15 mm in size. These polyps were removed with a hot snare.       Resection and retrieval were complete. Estimated blood loss: none.      Multiple large-mouthed diverticula were found in the recto-sigmoid colon       and sigmoid colon.      Non-bleeding external hemorrhoids were found during retroflexion. The       hemorrhoids were medium-sized. Impression:            - Two 10 to 15 mm polyps in the sigmoid colon, removed                         with a hot snare. Resected and retrieved.                        -  Diverticulosis in the recto-sigmoid colon and in the                         sigmoid colon.                        - Non-bleeding external hemorrhoids. Recommendation:        - Discharge patient to home (with escort).                        - Resume previous diet today.                        - Continue present medications.                        - Await pathology results.                        - Repeat colonoscopy in 3 - 5 years for surveillance                         based on pathology results. Procedure Code(s):     --- Professional ---                        [phone], Colonoscopy, flexible; with removal of                         tumor(s), polyp(s), or other lesion(s) by snare                         technique Diagnosis Code(s):     ---  Professional ---                        Z80.0, Family history of malignant neoplasm of                         digestive organs                        D12.5, Benign neoplasm of sigmoid colon                        K64.4, Residual hemorrhoidal skin tags                        K57.30, Diverticulosis of large intestine without                         perforation or abscess without bleeding CPT copyright 2022 American Medical Association. All rights reserved. The codes documented in this report are preliminary and upon coder review may  be revised to meet current compliance requirements. Dr. Libby Maw Toney Reil MD, MD 08/09/2023 [phone] PM This report has been signed electronically. Number of Addenda: 0 Note Initiated On: 08/09/2023 1:37 PM Scope Withdrawal Time: 0 hours 14 minutes 18 seconds  Total Procedure Duration: 0 hours 18 minutes 5 seconds  Estimated Blood Loss:  Estimated blood loss: none.      [facility]

## 2023-08-10 ENCOUNTER — Encounter: Payer: Self-pay | Admitting: Gastroenterology

## 2023-08-12 ENCOUNTER — Telehealth: Payer: Medicaid Other | Admitting: Family

## 2023-08-12 ENCOUNTER — Encounter: Payer: Self-pay | Admitting: Gastroenterology

## 2023-08-12 DIAGNOSIS — G8929 Other chronic pain: Secondary | ICD-10-CM

## 2023-08-12 LAB — ALLERGEN PANEL (27) + IGE
Alternaria Alternata IgE: <0.1 kU/L
Aspergillus Fumigatus IgE: <0.1 kU/L
Bahia Grass IgE: <0.1 kU/L
Bermuda Grass IgE: <0.1 kU/L
Cat Dander IgE: <0.1 kU/L
Cedar, Mountain IgE: <0.1 kU/L
Cladosporium Herbarum IgE: <0.1 kU/L
Cocklebur IgE: <0.1 kU/L
Cockroach, American IgE: 0.13 kU/L — AB
Common Silver Birch IgE: <0.1 kU/L
D Farinae IgE: <0.1 kU/L
D Pteronyssinus IgE: <0.1 kU/L
Dog Dander IgE: <0.1 kU/L
Elm, American IgE: <0.1 kU/L
Hickory, White IgE: <0.1 kU/L
IgE (Immunoglobulin E), Serum: 136 [IU]/mL (ref 6–495)
Johnson Grass IgE: <0.1 kU/L
Kentucky Bluegrass IgE: <0.1 kU/L
Maple/Box Elder IgE: <0.1 kU/L
Mucor Racemosus IgE: <0.1 kU/L
Oak, White IgE: <0.1 kU/L
Penicillium Chrysogen IgE: <0.1 kU/L
Pigweed, Rough IgE: <0.1 kU/L
Plantain, English IgE: <0.1 kU/L
Ragweed, Short IgE: <0.1 kU/L
Setomelanomma Rostrat: <0.1 kU/L
Timothy Grass IgE: <0.1 kU/L
White Mulberry IgE: <0.1 kU/L

## 2023-08-12 NOTE — Progress Notes (Signed)
Because of your pain and you are already on a muscle relaxer, I feel your condition warrants further evaluation and I recommend that you be seen in a face to face visit.  Through an evisit, I can only prescribe a muscle relaxer and NSAID. I recommend following up with your PCP to discuss your pain.    NOTE: There will be NO CHARGE for this eVisit   If you are having a true medical emergency please call 911.      For an urgent face to face visit, [place] has eight urgent care centers for your convenience:   NEW!! [facility] Health [facility] Center at [facility] Get Driving Directions [phone] [address], [address] [place], [address]    [facility] Health Urgent Care Center at [facility] Get Driving Directions [phone] [address] [place], [place] [address]   [facility] Health Urgent Care Center [facility]) Get Driving Directions [phone] [address] [place], [place] [address]  [facility] Health Urgent Care Center [facility]) Get Driving Directions [phone] [address] [place],  [place]  [address]  [facility] Health Urgent Care Center [facility] - at [employer]  [phone] [address] [place],  [place] [address]   [facility] Health Urgent Care at [facility] Get Driving Directions [phone] [address] [place], [place] [address]   [facility] Health Urgent Care at [facility] Get Driving Directions  [phone] [address] [place], [place] [address]   [facility] Health Urgent Care at [facility] Directions [phone] [address] [place], [place] [address]  Your MyChart E-visit questionnaire answers were reviewed by a board certified advanced clinical practitioner to complete your personal care plan based on your specific symptoms.  Thank you for using e-Visits.

## 2023-08-13 ENCOUNTER — Ambulatory Visit
Admission: RE | Admit: 2023-08-13 | Discharge: 2023-08-13 | Disposition: A | Discharge status: Home / Self Care | Payer: Medicaid Other | Source: Ambulatory Visit | Attending: Pulmonary Disease | Admitting: Pulmonary Disease

## 2023-08-13 ENCOUNTER — Telehealth: Payer: Self-pay | Admitting: Pulmonary Disease

## 2023-08-13 DIAGNOSIS — J4551 Severe persistent asthma with (acute) exacerbation: Secondary | ICD-10-CM | POA: Diagnosis not present

## 2023-08-13 NOTE — Telephone Encounter (Signed)
The patient's insurance is unable to approve her CT at this time. They stated there was no abnormal test

## 2023-08-14 ENCOUNTER — Encounter: Payer: Self-pay | Admitting: Gastroenterology

## 2023-08-15 ENCOUNTER — Telehealth: Payer: Self-pay | Admitting: Pulmonary Disease

## 2023-08-15 NOTE — Telephone Encounter (Signed)
Now this patient is the one that had her CT that was not approved do you want me to set up peer to peer for her. Let me know when I should setup the appt

## 2023-08-15 NOTE — Telephone Encounter (Signed)
ATC the patient. LVM for patient to return my call. 

## 2023-08-15 NOTE — Telephone Encounter (Signed)
With the use of albuterol the way it is right now every 2 hours or more frequently, she really needs to be seen in the emergency room.  I suspect she is going to have to be admitted due to severe asthma exacerbation.

## 2023-08-15 NOTE — Telephone Encounter (Signed)
Lm x1 for patient.  

## 2023-08-15 NOTE — Telephone Encounter (Signed)
Pt. Called Nurse back

## 2023-08-15 NOTE — Telephone Encounter (Addendum)
Chest tightness and increased SOB since Monday. Wheezing. Cough with clear sputum.  No fevers, chills or sweats. Symbicort helps more since he increased the dose, but her breathing is still bad. Still wheezing even after she using her nebulizer treatment.  Proventil- every 6 hours Ventolin- every 2 hours if not more Symbicort-  BID Spiriva- BID  [facility] [address]  Dr. Jayme Cloud will you advise since Dr. Larinda Buttery is out of the office.

## 2023-08-15 NOTE — Telephone Encounter (Signed)
I have notified the patient. She will go to the ED.   Nothing further needed.

## 2023-08-17 NOTE — Telephone Encounter (Signed)
Hi Anita,  Yes this is her. We can do it next week Friday if possible thanks,.

## 2023-08-21 NOTE — Telephone Encounter (Signed)
I tried calling to schedule the peer to peer for this Friday 08/24/23 and they stated the provider just calls and they transfer them to the  MD. I will help with this on Friday

## 2023-08-22 ENCOUNTER — Ambulatory Visit: Payer: Medicaid Other | Attending: Otolaryngology

## 2023-08-22 DIAGNOSIS — G4761 Periodic limb movement disorder: Secondary | ICD-10-CM | POA: Diagnosis not present

## 2023-08-22 DIAGNOSIS — R0602 Shortness of breath: Secondary | ICD-10-CM | POA: Insufficient documentation

## 2023-08-22 DIAGNOSIS — J4551 Severe persistent asthma with (acute) exacerbation: Secondary | ICD-10-CM | POA: Diagnosis not present

## 2023-08-22 DIAGNOSIS — G4733 Obstructive sleep apnea (adult) (pediatric): Secondary | ICD-10-CM | POA: Insufficient documentation

## 2023-08-22 DIAGNOSIS — R0683 Snoring: Secondary | ICD-10-CM | POA: Diagnosis present

## 2023-08-27 ENCOUNTER — Telehealth (HOSPITAL_BASED_OUTPATIENT_CLINIC_OR_DEPARTMENT_OTHER): Payer: Self-pay | Admitting: Pulmonary Disease

## 2023-08-27 DIAGNOSIS — G4734 Idiopathic sleep related nonobstructive alveolar hypoventilation: Secondary | ICD-10-CM

## 2023-08-27 NOTE — Telephone Encounter (Signed)
Will await Dr. Larinda Buttery

## 2023-08-27 NOTE — Telephone Encounter (Signed)
NPSG >> no sig oSA She had hypoxia with sat <88% for 200 mins , not sure if this is due to uncontrolled asthma?

## 2023-08-27 NOTE — Telephone Encounter (Signed)
Lindsay Harris, can you contact sleep center and ask for sleep study to be faxed to our office for Dr. Larinda Buttery to review.

## 2023-08-28 NOTE — Telephone Encounter (Signed)
Sleep study received and placed in Dr. Candis Musa folder for review.

## 2023-08-30 ENCOUNTER — Encounter: Payer: Medicaid Other | Attending: Pulmonary Disease | Admitting: *Deleted

## 2023-08-30 DIAGNOSIS — J4551 Severe persistent asthma with (acute) exacerbation: Secondary | ICD-10-CM

## 2023-08-30 NOTE — Progress Notes (Signed)
Initial phone call completed. Diagnosis can be found in [facility] 8/19. EP Orientation scheduled for Monday 9/23 at 2:30.    Lindsay Harris is a current tobacco user. Intervention for tobacco cessation was provided at the initial medical review. She was asked about readiness to quit and reported she is interested in more information. Patient was advised and educated about tobacco cessation using combination therapy, tobacco cessation classes, quit line, and quit smoking apps. Patient demonstrated understanding of this material. Staff will continue to provide encouragement and follow up with the patient throughout the program.

## 2023-08-30 NOTE — Telephone Encounter (Signed)
Will you be doing the peer to peer for this patient tomorrow remember she already had the CT

## 2023-09-03 MED ORDER — LINACLOTIDE 290 MCG PO CAPS: 290.0000 ug | ORAL_CAPSULE | Freq: Every day | ORAL | 1 refills | Status: DC

## 2023-09-04 ENCOUNTER — Encounter: Payer: Self-pay | Admitting: Obstetrics & Gynecology

## 2023-09-04 ENCOUNTER — Other Ambulatory Visit (HOSPITAL_COMMUNITY)
Admission: RE | Admit: 2023-09-04 | Discharge: 2023-09-04 | Disposition: A | Discharge status: Home / Self Care | Payer: Medicaid Other | Source: Ambulatory Visit | Attending: Obstetrics & Gynecology | Admitting: Obstetrics & Gynecology

## 2023-09-04 ENCOUNTER — Ambulatory Visit (INDEPENDENT_AMBULATORY_CARE_PROVIDER_SITE_OTHER): Payer: Medicaid Other | Admitting: Obstetrics & Gynecology

## 2023-09-04 VITALS — BP 104/53 | HR 74 | Ht 63.0 in | Wt 209.0 lb

## 2023-09-04 DIAGNOSIS — Z0000 Encounter for general adult medical examination without abnormal findings: Secondary | ICD-10-CM | POA: Diagnosis present

## 2023-09-04 DIAGNOSIS — Z124 Encounter for screening for malignant neoplasm of cervix: Secondary | ICD-10-CM | POA: Insufficient documentation

## 2023-09-04 DIAGNOSIS — Z01419 Encounter for gynecological examination (general) (routine) without abnormal findings: Secondary | ICD-10-CM | POA: Diagnosis present

## 2023-09-04 DIAGNOSIS — E6609 Other obesity due to excess calories: Secondary | ICD-10-CM

## 2023-09-04 NOTE — Progress Notes (Signed)
GYNECOLOGY ANNUAL PHYSICAL EXAM PROGRESS NOTE  Subjective:    Lindsay Harris is a 47 y.o. married P2 who presents for an annual exam.  The patient is sexually active. The patient participates in regular exercise: no. Has the patient ever been transfused or tattooed?: yes. The patient reports that there is not domestic violence in her life.   She has been taking Dewayne Hatch now for about 2 months and reports that her pain is greatly improved. She has some hot flashes/night sweats but is happy to have them instead of the pain. No recent periods. Menstrual History:  Patient's last menstrual period was 07/30/2023 (exact date).     Gynecologic History:  Contraception: tubal ligation History of STI's:   Monogamous for 28 years. Mammogram normal 02/2023   FH- + colon cancer in mother and maternal uncle, + breast in maternal GM  She is UTD with her colonoscopy (had polyps)    OB History  No obstetric history on file.    Past Medical History:  Diagnosis Date   Asthma    Dyspnea    GERD (gastroesophageal reflux disease)    Headache    migraines    Past Surgical History:  Procedure Laterality Date   COLONOSCOPY WITH PROPOFOL N/A 08/09/2023   Procedure: COLONOSCOPY WITH BIOPSY;  Surgeon: Toney Reil, MD;  Location: [facility];  Service: Endoscopy;  Laterality: N/A;   POLYPECTOMY N/A 08/09/2023   Procedure: POLYPECTOMY;  Surgeon: Toney Reil, MD;  Location: [facility];  Service: Endoscopy;  Laterality: N/A;    Family History  Problem Relation Age of Onset   Cancer Mother 84   Cancer Maternal Uncle    Cancer Maternal Grandmother 25   Breast cancer Maternal Grandmother     Social History   Socioeconomic History   Marital status: Married    Spouse name: Not on file   Number of children: Not on file   Years of education: Not on file   Highest education level: Not on file  Occupational History   Not on file  Tobacco Use   Smoking  status: Every Day    Current packs/day: 1.00    Types: Cigarettes   Smokeless tobacco: Not on file   Tobacco comments:    Has smoked since age 73  Substance and Sexual Activity   Alcohol use: No   Drug use: No   Sexual activity: Not on file    Comment: BTL  Other Topics Concern   Not on file  Social History Narrative   Not on file   Social Determinants of Health   Financial Resource Strain: Not on file  Food Insecurity: Not on file  Transportation Needs: Not on file  Physical Activity: Not on file  Stress: Not on file  Social Connections: Not on file  Intimate Partner Violence: Not on file    Current Outpatient Medications on File Prior to Visit  Medication Sig Dispense Refill   albuterol (PROVENTIL) (2.5 MG/3ML) 0.083% nebulizer solution Take 2.5 mg by nebulization every 4 (four) hours as needed.     albuterol (VENTOLIN HFA) 108 (90 Base) MCG/ACT inhaler Inhale 2 puffs into the lungs every 6 (six) hours as needed.     Albuterol-Budesonide (AIRSUPRA) 90-80 MCG/ACT AERO Inhale 2 puffs into the lungs every 6 (six) hours. 1 g 3   budesonide-formoterol (SYMBICORT) 160-4.5 MCG/ACT inhaler Inhale 2 puffs into the lungs in the morning and at bedtime. 1 each 12   Elagolix Sodium (ORILISSA)  200 MG TABS Take 1 tablet (200 mg total) by mouth 2 (two) times daily. 60 tablet 6   EPINEPHrine 0.3 mg/0.3 mL IJ SOAJ injection Inject 0.3 mg into the muscle as needed for anaphylaxis.     linaclotide (LINZESS) 290 MCG CAPS capsule Take 1 capsule (290 mcg total) by mouth daily before breakfast. 30 capsule 1   methocarbamol (ROBAXIN) 500 MG tablet Take 500 mg by mouth every 8 (eight) hours as needed.     montelukast (SINGULAIR) 10 MG tablet Take 10 mg by mouth at bedtime.     nicotine (NICODERM CQ - DOSED IN MG/24 HOURS) 14 mg/24hr patch Place 1 patch (14 mg total) onto the skin daily. 30 patch 0   nicotine polacrilex (NICOTINE MINI) 2 MG lozenge Take 1 lozenge (2 mg total) by mouth as needed for  smoking cessation. 100 tablet 0   ondansetron (ZOFRAN) 4 mg TABS tablet Take by mouth every 8 (eight) hours as needed.     pantoprazole (PROTONIX) 40 MG tablet Take 40 mg by mouth daily.     propranolol ER (INDERAL LA) 80 MG 24 hr capsule Take 80 mg by mouth daily.     SPIRIVA HANDIHALER 18 MCG inhalation capsule Place 18 mcg into inhaler and inhale daily.     SUMAtriptan (IMITREX) 25 MG tablet Take 25 mg by mouth every 2 (two) hours as needed.     triamcinolone cream (KENALOG) 0.1 % Apply topically 2 (two) times daily.     sertraline (ZOLOFT) 100 MG tablet Take 100 mg by mouth daily.     sertraline (ZOLOFT) 50 MG tablet Take 50 mg by mouth daily.     SYMBICORT 80-4.5 MCG/ACT inhaler Inhale 2 puffs into the lungs.     No current facility-administered medications on file prior to visit.    Allergies  Allergen Reactions   Amoxicillin Anaphylaxis and Hives   Morphine Hives, Itching, Nausea Only and Shortness Of Breath     Review of Systems Constitutional: negative for chills, fatigue, fevers and sweats Eyes: negative for irritation, redness and visual disturbance Ears, nose, mouth, throat, and face: negative for hearing loss, nasal congestion, snoring and tinnitus Respiratory: negative for asthma, cough, sputum Cardiovascular: negative for chest pain, dyspnea, exertional chest pressure/discomfort, irregular heart beat, palpitations and syncope Gastrointestinal: negative for abdominal pain, change in bowel habits, nausea and vomiting Genitourinary: negative for abnormal menstrual periods, genital lesions, sexual problems and vaginal discharge, dysuria and urinary incontinence Integument/breast: negative for breast lump, breast tenderness and nipple discharge Hematologic/lymphatic: negative for bleeding and easy bruising Musculoskeletal:negative for back pain and muscle weakness Neurological: negative for dizziness, headaches, vertigo and weakness Endocrine: negative for diabetic symptoms  including polydipsia, polyuria and skin dryness Allergic/Immunologic: negative for hay fever and urticaria      Objective:  Blood pressure (!) 104/53, pulse 74, height 5\' 3"  (1.6 m), weight 209 lb (94.8 kg), last menstrual period 07/30/2023. Body mass index is 37.02 kg/m.    General Appearance:    Alert, cooperative, no distress, appears stated age  Head:    Normocephalic, without obvious abnormality, atraumatic  Eyes:    PERRL, conjunctiva/corneas clear, EOM's intact, both eyes  Ears:    Normal external ear canals, both ears  Nose:   Nares normal, septum midline, mucosa normal, no drainage or sinus tenderness  Throat:   Lips, mucosa, and tongue normal; teeth and gums normal  Neck:   Supple, symmetrical, trachea midline, no adenopathy; thyroid: no enlargement/tenderness/nodules; no carotid bruit or JVD  Back:     Symmetric, no curvature, ROM normal, no CVA tenderness  Lungs:     Clear to auscultation bilaterally, respirations unlabored  Chest Wall:    No tenderness or deformity   Heart:    Regular rate and rhythm, S1 and S2 normal, no murmur, rub or gallop  Breast Exam:    No tenderness, masses, or nipple abnormality  Abdomen:     Soft, non-tender, bowel sounds active all four quadrants, no masses, no organomegaly.    Genitalia:    Pelvic:external genitalia normal, vagina without lesions, discharge, or tenderness, rectovaginal septum  normal. Cervix normal in appearance, no cervical motion tenderness, no adnexal masses or tenderness.  Uterus normal size, shape, mobile, regular contours, nontender.     Extremities:   Extremities normal, atraumatic, no cyanosis or edema  Pulses:   2+ and symmetric all extremities  Skin:   Skin color, texture, turgor normal, no rashes or lesions  Lymph nodes:   Cervical, supraclavicular, and axillary nodes normal  Neurologic:   CNII-XII intact, normal strength, sensation and reflexes throughout   .  Labs:  Lab Results  Component Value Date   WBC 9.7  07/23/2023   HGB 14.5 07/23/2023   HCT 42.3 07/23/2023   MCV 91.4 07/23/2023   PLT 332 07/23/2023    Lab Results  Component Value Date   CREATININE 0.94 07/23/2023   BUN 6 07/23/2023   NA 135 07/23/2023   K 3.7 07/23/2023   CL 105 07/23/2023   CO2 20 (L) 07/23/2023    Lab Results  Component Value Date   ALT 20 06/07/2023   AST 21 06/07/2023   ALKPHOS 79 06/07/2023   BILITOT 0.6 06/07/2023      Assessment:   1. Well woman exam with routine gynecological exam   2. Screening for cervical cancer   3. Preventative health care   4. Class 2 obesity due to excess calories with body mass index (BMI) of 37.0 to 37.9 in adult, unspecified whether serious comorbidity present      Plan:  Preventative labs done. She has an appt with her primary care provider next month and will discuss the lab results with her then. Pap smear with HPV cotesting Continue Orilissa Follow up in 1 year for annual exam   Allie Bossier, MD [facility]

## 2023-09-06 NOTE — Telephone Encounter (Signed)
Peer to peer was done 09/05/23 Auth # [id number] valid 08/13/23 to 09/12/2023

## 2023-09-07 LAB — CYTOLOGY - PAP
Adequacy: ABSENT
Comment: NEGATIVE
Diagnosis: NEGATIVE
High risk HPV: NEGATIVE

## 2023-09-10 ENCOUNTER — Encounter: Payer: Self-pay | Admitting: Obstetrics & Gynecology

## 2023-09-10 ENCOUNTER — Ambulatory Visit: Payer: Medicaid Other

## 2023-09-10 NOTE — Telephone Encounter (Signed)
Dr. Larinda Buttery, please advise. Were you able to review report?

## 2023-09-12 ENCOUNTER — Encounter: Payer: Self-pay | Admitting: Pulmonary Disease

## 2023-09-12 NOTE — Telephone Encounter (Signed)
Please refer to phone note.

## 2023-09-12 NOTE — Telephone Encounter (Signed)
Patient sent mychart message requesting sleep study results.  Dr. Larinda Buttery, please advise. Thanks

## 2023-09-14 NOTE — Addendum Note (Signed)
Addended by: Lajoyce Lauber A on: 09/14/2023 10:00 AM   Modules accepted: Orders

## 2023-09-14 NOTE — Telephone Encounter (Signed)
Dr. Larinda Buttery, please advise. Thanks

## 2023-09-14 NOTE — Telephone Encounter (Signed)
Patient is aware of below results and voiced her understanding.  She would like to proceed with oxygen. Order has been placed.  Patient stated that the tech told her the night of the sleep study that she did qualify for CPAP. She would like clarification.    Dr. Larinda Buttery, please advise. Thanks

## 2023-09-16 ENCOUNTER — Other Ambulatory Visit: Payer: Self-pay | Admitting: Pulmonary Disease

## 2023-09-16 DIAGNOSIS — F17200 Nicotine dependence, unspecified, uncomplicated: Secondary | ICD-10-CM

## 2023-09-17 MED ORDER — NICOTINE 14 MG/24HR TD PT24
14.0000 mg | MEDICATED_PATCH | TRANSDERMAL | 0 refills | Status: DC
Start: 2023-09-17 — End: 2023-12-27

## 2023-09-18 ENCOUNTER — Ambulatory Visit: Payer: Self-pay | Admitting: Psychiatry

## 2023-09-21 ENCOUNTER — Ambulatory Visit: Payer: Self-pay | Admitting: Family Medicine

## 2023-09-24 ENCOUNTER — Encounter: Payer: Medicaid Other | Attending: Pulmonary Disease

## 2023-09-24 VITALS — Ht 64.8 in | Wt 212.9 lb

## 2023-09-24 DIAGNOSIS — J4551 Severe persistent asthma with (acute) exacerbation: Secondary | ICD-10-CM | POA: Insufficient documentation

## 2023-09-24 NOTE — Progress Notes (Signed)
Pulmonary Individual Treatment Plan  Patient Details  Name: Lindsay Harris MRN: [id number] Date of Birth: Sep 03, 1976 Referring Provider:   Flowsheet Row Pulmonary Rehab from 09/24/2023 in [facility] Cardiac and Pulmonary Rehab  Referring Provider Janann Colonel, MD       Initial Encounter Date:  Flowsheet Row Pulmonary Rehab from 09/24/2023 in [facility] Cardiac and Pulmonary Rehab  Date 09/24/23       Visit Diagnosis: Severe persistent asthma with acute exacerbation  Patient's Home Medications on Admission:  Current Outpatient Medications:    albuterol (PROVENTIL) (2.5 MG/3ML) 0.083% nebulizer solution, Take 2.5 mg by nebulization every 4 (four) hours as needed., Disp: , Rfl:    albuterol (VENTOLIN HFA) 108 (90 Base) MCG/ACT inhaler, Inhale 2 puffs into the lungs every 6 (six) hours as needed., Disp: , Rfl:    Albuterol-Budesonide (AIRSUPRA) 90-80 MCG/ACT AERO, Inhale 2 puffs into the lungs every 6 (six) hours., Disp: 1 g, Rfl: 3   budesonide-formoterol (SYMBICORT) 160-4.5 MCG/ACT inhaler, Inhale 2 puffs into the lungs in the morning and at bedtime., Disp: 1 each, Rfl: 12   Elagolix Sodium (ORILISSA) 200 MG TABS, Take 1 tablet (200 mg total) by mouth 2 (two) times daily., Disp: 60 tablet, Rfl: 6   EPINEPHrine 0.3 mg/0.3 mL IJ SOAJ injection, Inject 0.3 mg into the muscle as needed for anaphylaxis., Disp: , Rfl:    linaclotide (LINZESS) 290 MCG CAPS capsule, Take 1 capsule (290 mcg total) by mouth daily before breakfast., Disp: 30 capsule, Rfl: 1   methocarbamol (ROBAXIN) 500 MG tablet, Take 500 mg by mouth every 8 (eight) hours as needed., Disp: , Rfl:    montelukast (SINGULAIR) 10 MG tablet, Take 10 mg by mouth at bedtime., Disp: , Rfl:    nicotine (NICODERM CQ - DOSED IN MG/24 HOURS) 14 mg/24hr patch, Place 1 patch (14 mg total) onto the skin daily., Disp: 30 patch, Rfl: 0   nicotine polacrilex (NICOTINE MINI) 2 MG lozenge, Take 1 lozenge (2 mg total) by mouth as needed for smoking  cessation., Disp: 100 tablet, Rfl: 0   ondansetron (ZOFRAN) 4 mg TABS tablet, Take by mouth every 8 (eight) hours as needed., Disp: , Rfl:    pantoprazole (PROTONIX) 40 MG tablet, Take 40 mg by mouth daily., Disp: , Rfl:    propranolol ER (INDERAL LA) 80 MG 24 hr capsule, Take 80 mg by mouth daily., Disp: , Rfl:    sertraline (ZOLOFT) 100 MG tablet, Take 100 mg by mouth daily., Disp: , Rfl:    sertraline (ZOLOFT) 50 MG tablet, Take 50 mg by mouth daily., Disp: , Rfl:    SPIRIVA HANDIHALER 18 MCG inhalation capsule, Place 18 mcg into inhaler and inhale daily., Disp: , Rfl:    SUMAtriptan (IMITREX) 25 MG tablet, Take 25 mg by mouth every 2 (two) hours as needed., Disp: , Rfl:    SYMBICORT 80-4.5 MCG/ACT inhaler, Inhale 2 puffs into the lungs., Disp: , Rfl:    triamcinolone cream (KENALOG) 0.1 %, Apply topically 2 (two) times daily., Disp: , Rfl:   Past Medical History: Past Medical History:  Diagnosis Date   Asthma    Dyspnea    GERD (gastroesophageal reflux disease)    Headache    migraines    Tobacco Use: Social History   Tobacco Use  Smoking Status Every Day   Current packs/day: 1.00   Types: Cigarettes  Smokeless Tobacco Not on file  Tobacco Comments   Has smoked since age 39    Labs: Review  Flowsheet       Latest Ref Rng & Units 09/19/2018 07/23/2023 09/04/2023  Labs for ITP Cardiac and Pulmonary Rehab  Cholestrol 100 - 199 mg/dL - - 981   LDL (calc) 0 - 99 mg/dL - - 191   HDL-C >47 mg/dL - - 50   Trlycerides 0 - 149 mg/dL - - 829   Hemoglobin F6O 4.8 - 5.6 % - - 6.1   Bicarbonate 20.0 - 28.0 mmol/L - 23.1  -  TCO2 22 - 32 mmol/L 20  - -  O2 Saturation % - 81.5  -    Details             Pulmonary Assessment Scores:  Pulmonary Assessment Scores     Row Name 09/24/23 1642         ADL UCSD   ADL Phase Entry     SOB Score total 117     Rest 5     Walk 5     Stairs 5     Bath 5     Dress 5     Shop 5       CAT Score   CAT Score 40       mMRC Score    mMRC Score 4              UCSD: Self-administered rating of dyspnea associated with activities of daily living (ADLs) 6-point scale (0 = "not at all" to 5 = "maximal or unable to do because of breathlessness")  Scoring Scores range from 0 to 120.  Minimally important difference is 5 units  CAT: CAT can identify the health impairment of COPD patients and is better correlated with disease progression.  CAT has a scoring range of zero to 40. The CAT score is classified into four groups of low (less than 10), medium (10 - 20), high (21-30) and very high (31-40) based on the impact level of disease on health status. A CAT score over 10 suggests significant symptoms.  A worsening CAT score could be explained by an exacerbation, poor medication adherence, poor inhaler technique, or progression of COPD or comorbid conditions.  CAT MCID is 2 points  mMRC: mMRC (Modified Medical Research Council) Dyspnea Scale is used to assess the degree of baseline functional disability in patients of respiratory disease due to dyspnea. No minimal important difference is established. A decrease in score of 1 point or greater is considered a positive change.   Pulmonary Function Assessment:   Exercise Target Goals: Exercise Program Goal: Individual exercise prescription set using results from initial 6 min walk test and THRR while considering  patient's activity barriers and safety.   Exercise Prescription Goal: Initial exercise prescription builds to 30-45 minutes a day of aerobic activity, 2-3 days per week.  Home exercise guidelines will be given to patient during program as part of exercise prescription that the participant will acknowledge.  Education: Aerobic Exercise: - Group verbal and visual presentation on the components of exercise prescription. Introduces F.I.T.T principle from ACSM for exercise prescriptions.  Reviews F.I.T.T. principles of aerobic exercise including progression. Written  material given at graduation.   Education: Resistance Exercise: - Group verbal and visual presentation on the components of exercise prescription. Introduces F.I.T.T principle from ACSM for exercise prescriptions  Reviews F.I.T.T. principles of resistance exercise including progression. Written material given at graduation.    Education: Exercise & Equipment Safety: - Individual verbal instruction and demonstration of equipment use and safety with use of the  equipment. Flowsheet Row Pulmonary Rehab from 09/24/2023 in [facility] Cardiac and Pulmonary Rehab  Date 09/24/23  Educator MB  Instruction Review Code 1- Verbalizes Understanding       Education: Exercise Physiology & General Exercise Guidelines: - Group verbal and written instruction with models to review the exercise physiology of the cardiovascular system and associated critical values. Provides general exercise guidelines with specific guidelines to those with heart or lung disease.    Education: Flexibility, Balance, Mind/Body Relaxation: - Group verbal and visual presentation with interactive activity on the components of exercise prescription. Introduces F.I.T.T principle from ACSM for exercise prescriptions. Reviews F.I.T.T. principles of flexibility and balance exercise training including progression. Also discusses the mind body connection.  Reviews various relaxation techniques to help reduce and manage stress (i.e. Deep breathing, progressive muscle relaxation, and visualization). Balance handout provided to take home. Written material given at graduation.   Activity Barriers & Risk Stratification:  Activity Barriers & Cardiac Risk Stratification - 09/24/23 1637       Activity Barriers & Cardiac Risk Stratification   Activity Barriers Back Problems;Balance Concerns             6 Minute Walk:  6 Minute Walk     Row Name 09/24/23 1634         6 Minute Walk   Phase Initial     Distance 600 feet     Walk Time 6  minutes     # of Rest Breaks 0     MPH 1.14     METS 2.3     RPE 19     Perceived Dyspnea  4     VO2 Peak 8.04     Symptoms Yes (comment)     Comments SOB, coughing, and back pain     Resting HR 78 bpm     Resting BP 116/76     Resting Oxygen Saturation  88 %     Exercise Oxygen Saturation  during 6 min walk 84 %     Max Ex. HR 83 bpm     Max Ex. BP 124/72     2 Minute Post BP 102/70       Interval HR   1 Minute HR 83     2 Minute HR 81     3 Minute HR 77     4 Minute HR 76     5 Minute HR 79     6 Minute HR 78     2 Minute Post HR 74     Interval Heart Rate? Yes       Interval Oxygen   Interval Oxygen? Yes     Baseline Oxygen Saturation % 88 %     1 Minute Oxygen Saturation % 85 %     1 Minute Liters of Oxygen 2 L     2 Minute Oxygen Saturation % 85 %     2 Minute Liters of Oxygen 2 L     3 Minute Oxygen Saturation % 84 %     3 Minute Liters of Oxygen 2 L     4 Minute Oxygen Saturation % 84 %     4 Minute Liters of Oxygen 2 L     5 Minute Oxygen Saturation % 84 %     5 Minute Liters of Oxygen 2 L     6 Minute Oxygen Saturation % 86 %     6 Minute Liters of Oxygen 2 L  2 Minute Post Oxygen Saturation % 86 %     2 Minute Post Liters of Oxygen 2 L             Oxygen Initial Assessment:  Oxygen Initial Assessment - 08/30/23 1500       Home Oxygen   Home Oxygen Device None    Sleep Oxygen Prescription None    Home Exercise Oxygen Prescription None    Home Resting Oxygen Prescription None    Compliance with Home Oxygen Use Yes      Intervention   Short Term Goals To learn and understand importance of monitoring SPO2 with pulse oximeter and demonstrate accurate use of the pulse oximeter.;To learn and demonstrate proper pursed lip breathing techniques or other breathing techniques. ;To learn and understand importance of maintaining oxygen saturations>88%;To learn and demonstrate proper use of respiratory medications    Long  Term Goals Verbalizes importance  of monitoring SPO2 with pulse oximeter and return demonstration;Maintenance of O2 saturations>88%;Exhibits proper breathing techniques, such as pursed lip breathing or other method taught during program session;Compliance with respiratory medication;Demonstrates proper use of MDI's             Oxygen Re-Evaluation:   Oxygen Discharge (Final Oxygen Re-Evaluation):   Initial Exercise Prescription:  Initial Exercise Prescription - 09/24/23 1600       Date of Initial Exercise RX and Referring Provider   Date 09/24/23    Referring Provider Janann Colonel, MD      Oxygen   Maintain Oxygen Saturation 88% or higher      Treadmill   MPH 1.2    Grade 0    Minutes 15    METs 1.92      Recumbant Bike   Level 1    RPM 50    Watts 9    Minutes 15    METs 2.3      NuStep   Level 1    SPM 80    Minutes 15    METs 2.3      Biostep-RELP   Level 1    SPM 50    Minutes 15    METs 2.3      Prescription Details   Frequency (times per week) 2    Duration Progress to 30 minutes of continuous aerobic without signs/symptoms of physical distress      Intensity   THRR 40-80% of Max Heartrate 116-154    Ratings of Perceived Exertion 11-13    Perceived Dyspnea 0-4      Progression   Progression Continue to progress workloads to maintain intensity without signs/symptoms of physical distress.      Resistance Training   Training Prescription Yes    Weight 0 lb    Reps 10-15             Perform Capillary Blood Glucose checks as needed.  Exercise Prescription Changes:   Exercise Prescription Changes     Row Name 09/24/23 1600             Response to Exercise   Blood Pressure (Admit) 116/76       Blood Pressure (Exercise) 124/72       Blood Pressure (Exit) 102/70       Heart Rate (Admit) 78 bpm       Heart Rate (Exercise) 83 bpm       Heart Rate (Exit) 73 bpm       Oxygen Saturation (Admit) 88 %       Oxygen  Saturation (Exercise) 84 %       Oxygen  Saturation (Exit) 85 %       Rating of Perceived Exertion (Exercise) 19       Perceived Dyspnea (Exercise) 4       Symptoms SOB, coughing, and back pain       Comments results       Duration Progress to 30 minutes of  aerobic without signs/symptoms of physical distress       Intensity THRR New         Progression   Progression Continue to progress workloads to maintain intensity without signs/symptoms of physical distress.       Average METs 2.3                Exercise Comments:   Exercise Goals and Review:   Exercise Goals     Row Name 09/24/23 1640             Exercise Goals   Increase Physical Activity Yes       Intervention Provide advice, education, support and counseling about physical activity/exercise needs.;Develop an individualized exercise prescription for aerobic and resistive training based on initial evaluation findings, risk stratification, comorbidities and participant's personal goals.       Expected Outcomes Short Term: Attend rehab on a regular basis to increase amount of physical activity.;Long Term: Exercising regularly at least 3-5 days a week.;Long Term: Add in home exercise to make exercise part of routine and to increase amount of physical activity.       Increase Strength and Stamina Yes       Intervention Provide advice, education, support and counseling about physical activity/exercise needs.;Develop an individualized exercise prescription for aerobic and resistive training based on initial evaluation findings, risk stratification, comorbidities and participant's personal goals.       Expected Outcomes Short Term: Increase workloads from initial exercise prescription for resistance, speed, and METs.;Short Term: Perform resistance training exercises routinely during rehab and add in resistance training at home;Long Term: Improve cardiorespiratory fitness, muscular endurance and strength as measured by increased METs and functional capacity ( )        Able to understand and use rate of perceived exertion (RPE) scale Yes       Intervention Provide education and explanation on how to use RPE scale       Expected Outcomes Short Term: Able to use RPE daily in rehab to express subjective intensity level;Long Term:  Able to use RPE to guide intensity level when exercising independently       Able to understand and use Dyspnea scale Yes       Intervention Provide education and explanation on how to use Dyspnea scale       Expected Outcomes Short Term: Able to use Dyspnea scale daily in rehab to express subjective sense of shortness of breath during exertion;Long Term: Able to use Dyspnea scale to guide intensity level when exercising independently       Knowledge and understanding of Target Heart Rate Range (THRR) Yes       Intervention Provide education and explanation of THRR including how the numbers were predicted and where they are located for reference       Expected Outcomes Short Term: Able to state/look up THRR;Long Term: Able to use THRR to govern intensity when exercising independently;Short Term: Able to use daily as guideline for intensity in rehab       Able to check pulse independently Yes  Intervention Provide education and demonstration on how to check pulse in carotid and radial arteries.;Review the importance of being able to check your own pulse for safety during independent exercise       Expected Outcomes Short Term: Able to explain why pulse checking is important during independent exercise;Long Term: Able to check pulse independently and accurately       Understanding of Exercise Prescription Yes       Intervention Provide education, explanation, and written materials on patient's individual exercise prescription       Expected Outcomes Short Term: Able to explain program exercise prescription;Long Term: Able to explain home exercise prescription to exercise independently                Exercise Goals Re-Evaluation  :   Discharge Exercise Prescription (Final Exercise Prescription Changes):  Exercise Prescription Changes - 09/24/23 1600       Response to Exercise   Blood Pressure (Admit) 116/76    Blood Pressure (Exercise) 124/72    Blood Pressure (Exit) 102/70    Heart Rate (Admit) 78 bpm    Heart Rate (Exercise) 83 bpm    Heart Rate (Exit) 73 bpm    Oxygen Saturation (Admit) 88 %    Oxygen Saturation (Exercise) 84 %    Oxygen Saturation (Exit) 85 %    Rating of Perceived Exertion (Exercise) 19    Perceived Dyspnea (Exercise) 4    Symptoms SOB, coughing, and back pain    Comments results    Duration Progress to 30 minutes of  aerobic without signs/symptoms of physical distress    Intensity THRR New      Progression   Progression Continue to progress workloads to maintain intensity without signs/symptoms of physical distress.    Average METs 2.3             Nutrition:  Target Goals: Understanding of nutrition guidelines, daily intake of sodium 1500mg , cholesterol 200mg , calories 30% from fat and 7% or less from saturated fats, daily to have 5 or more servings of fruits and vegetables.  Education: All About Nutrition: -Group instruction provided by verbal, written material, interactive activities, discussions, models, and posters to present general guidelines for heart healthy nutrition including fat, fiber, MyPlate, the role of sodium in heart healthy nutrition, utilization of the nutrition label, and utilization of this knowledge for meal planning. Follow up email sent as well. Written material given at graduation.   Biometrics:  Pre Biometrics - 09/24/23 1640       Pre Biometrics   Height 5' 4.8" (1.646 m)    Weight 212 lb 14.4 oz (96.6 kg)    Waist Circumference 42 inches    Hip Circumference 46 inches    Waist to Hip Ratio 0.91 %    BMI (Calculated) 35.64    Single Leg Stand 30 seconds              Nutrition Therapy Plan and Nutrition Goals:  Nutrition  Therapy & Goals - 09/24/23 1645       Personal Nutrition Goals   Nutrition Goal Meet with RD on 10/14      Intervention Plan   Intervention Prescribe, educate and counsel regarding individualized specific dietary modifications aiming towards targeted core components such as weight, hypertension, lipid management, diabetes, heart failure and other comorbidities.;Nutrition handout(s) given to patient.    Expected Outcomes Short Term Goal: Understand basic principles of dietary content, such as calories, fat, sodium, cholesterol and nutrients.;Long Term Goal: Adherence  to prescribed nutrition plan.;Short Term Goal: A plan has been developed with personal nutrition goals set during dietitian appointment.             Nutrition Assessments:  MEDIFICTS Score Key: >=70 Need to make dietary changes  40-70 Heart Healthy Diet <= 40 Therapeutic Level Cholesterol Diet  Flowsheet Row Pulmonary Rehab from 09/24/2023 in [facility] Cardiac and Pulmonary Rehab  Picture Your Plate Total Score on Admission 51      Picture Your Plate Scores: <78 Unhealthy dietary pattern with much room for improvement. 41-50 Dietary pattern unlikely to meet recommendations for good health and room for improvement. 51-60 More healthful dietary pattern, with some room for improvement.  >60 Healthy dietary pattern, although there may be some specific behaviors that could be improved.   Nutrition Goals Re-Evaluation:   Nutrition Goals Discharge (Final Nutrition Goals Re-Evaluation):   Psychosocial: Target Goals: Acknowledge presence or absence of significant depression and/or stress, maximize coping skills, provide positive support system. Participant is able to verbalize types and ability to use techniques and skills needed for reducing stress and depression.   Education: Stress, Anxiety, and Depression - Group verbal and visual presentation to define topics covered.  Reviews how body is impacted by stress, anxiety, and  depression.  Also discusses healthy ways to reduce stress and to treat/manage anxiety and depression.  Written material given at graduation.   Education: Sleep Hygiene -Provides group verbal and written instruction about how sleep can affect your health.  Define sleep hygiene, discuss sleep cycles and impact of sleep habits. Review good sleep hygiene tips.    Initial Review & Psychosocial Screening:  Initial Psych Review & Screening - 08/30/23 1441       Initial Review   Current issues with History of Depression;Current Anxiety/Panic;Current Stress Concerns;Current Sleep Concerns    Source of Stress Concerns Chronic Illness    Comments colon cancer/cervical cancer possibility; new sleep apnea      Family Dynamics   Good Support System? Yes   family     Barriers   Psychosocial barriers to participate in program There are no identifiable barriers or psychosocial needs.;The patient should benefit from training in stress management and relaxation.      Screening Interventions   Interventions Encouraged to exercise;To provide support and resources with identified psychosocial needs    Expected Outcomes Short Term goal: Utilizing psychosocial counselor, staff and physician to assist with identification of specific Stressors or current issues interfering with healing process. Setting desired goal for each stressor or current issue identified.;Long Term Goal: Stressors or current issues are controlled or eliminated.;Short Term goal: Identification and review with participant of any Quality of Life or Depression concerns found by scoring the questionnaire.;Long Term goal: The participant improves quality of Life and PHQ9 Scores as seen by post scores and/or verbalization of changes             Quality of Life Scores:  Scores of 19 and below usually indicate a poorer quality of life in these areas.  A difference of  2-3 points is a clinically meaningful difference.  A difference of 2-3 points  in the total score of the Quality of Life Index has been associated with significant improvement in overall quality of life, self-image, physical symptoms, and general health in studies assessing change in quality of life.  PHQ-9: Review Flowsheet       09/24/2023 06/29/2023  Depression screen PHQ 2/9  Decreased Interest 0 3  Down, Depressed, Hopeless 0 1  PHQ - 2 Score 0 4  Altered sleeping 0 3  Tired, decreased energy 2 3  Change in appetite 0 3  Feeling bad or failure about yourself  0 3  Trouble concentrating 0 3  Moving slowly or fidgety/restless 0 3  Suicidal thoughts 0 0  PHQ-9 Score 2 22  Difficult doing work/chores Extremely dIfficult Extremely dIfficult    Details           Interpretation of Total Score  Total Score Depression Severity:  1-4 = Minimal depression, 5-9 = Mild depression, 10-14 = Moderate depression, 15-19 = Moderately severe depression, 20-27 = Severe depression   Psychosocial Evaluation and Intervention:  Psychosocial Evaluation - 08/30/23 1451       Psychosocial Evaluation & Interventions   Interventions Encouraged to exercise with the program and follow exercise prescription;Stress management education;Relaxation education    Comments Lindsay Harris is coming to pulmonary rehab with chronic asthma. She has been recently diagnosed with sleep apnea, colon biopsies and cervical markers concerning for cancer. She also has 7 herniated discs which she is waiting for the inflammation to improve before they can do any type of intervention. Her husband and daughters are very supportive. She is motivated to stop smoking and has cut back to 1 pack a day. She is working with her doctor to help manage her depression and anxiety. Her current dose of zoloft does not feel to be working, so she is seeing her doctor tomorrow. Recently she has gained weight with unknown reason and she wants to get back to her normal weight. She is motivated to come to the program to work on  bettering her health and improving her stamina    Expected Outcomes Short: attend pulmonary rehab for education and exercise. Long: develop and maintain positive self care habits    Continue Psychosocial Services  Follow up required by staff             Psychosocial Re-Evaluation:   Psychosocial Discharge (Final Psychosocial Re-Evaluation):   Education: Education Goals: Education classes will be provided on a weekly basis, covering required topics. Participant will state understanding/return demonstration of topics presented.  Learning Barriers/Preferences:  Learning Barriers/Preferences - 08/30/23 1441       Learning Barriers/Preferences   Learning Barriers None    Learning Preferences None             General Pulmonary Education Topics:  Infection Prevention: - Provides verbal and written material to individual with discussion of infection control including proper hand washing and proper equipment cleaning during exercise session. Flowsheet Row Pulmonary Rehab from 09/24/2023 in [facility] Cardiac and Pulmonary Rehab  Date 09/24/23  Educator MB  Instruction Review Code 1- Verbalizes Understanding       Falls Prevention: - Provides verbal and written material to individual with discussion of falls prevention and safety. Flowsheet Row Pulmonary Rehab from 09/24/2023 in [facility] Cardiac and Pulmonary Rehab  Date 09/24/23  Educator MB  Instruction Review Code 1- Verbalizes Understanding       Chronic Lung Disease Review: - Group verbal instruction with posters, models, PowerPoint presentations and videos,  to review new updates, new respiratory medications, new advancements in procedures and treatments. Providing information on websites and "800" numbers for continued self-education. Includes information about supplement oxygen, available portable oxygen systems, continuous and intermittent flow rates, oxygen safety, concentrators, and Medicare reimbursement for oxygen.  Explanation of Pulmonary Drugs, including class, frequency, complications, importance of spacers, rinsing mouth after steroid MDI's, and proper cleaning methods for  nebulizers. Review of basic lung anatomy and physiology related to function, structure, and complications of lung disease. Review of risk factors. Discussion about methods for diagnosing sleep apnea and types of masks and machines for OSA. Includes a review of the use of types of environmental controls: home humidity, furnaces, filters, dust mite/pet prevention, HEPA vacuums. Discussion about weather changes, air quality and the benefits of nasal washing. Instruction on Warning signs, infection symptoms, calling MD promptly, preventive modes, and value of vaccinations. Review of effective airway clearance, coughing and/or vibration techniques. Emphasizing that all should Create an Action Plan. Written material given at graduation. Flowsheet Row Pulmonary Rehab from 09/24/2023 in [facility] Cardiac and Pulmonary Rehab  Education need identified 09/24/23       AED/CPR: - Group verbal and written instruction with the use of models to demonstrate the basic use of the AED with the basic ABC's of resuscitation.    Anatomy and Cardiac Procedures: - Group verbal and visual presentation and models provide information about basic cardiac anatomy and function. Reviews the testing methods done to diagnose heart disease and the outcomes of the test results. Describes the treatment choices: Medical Management, Angioplasty, or Coronary Bypass Surgery for treating various heart conditions including Myocardial Infarction, Angina, Valve Disease, and Cardiac Arrhythmias.  Written material given at graduation.   Medication Safety: - Group verbal and visual instruction to review commonly prescribed medications for heart and lung disease. Reviews the medication, class of the drug, and side effects. Includes the steps to properly store meds and maintain the  prescription regimen.  Written material given at graduation.   Other: -Provides group and verbal instruction on various topics (see comments)   Knowledge Questionnaire Score:  Knowledge Questionnaire Score - 09/24/23 1648       Knowledge Questionnaire Score   Pre Score 16/18              Core Components/Risk Factors/Patient Goals at Admission:  Personal Goals and Risk Factors at Admission - 09/24/23 1648       Core Components/Risk Factors/Patient Goals on Admission    Weight Management Yes;Weight Loss    Intervention Weight Management: Develop a combined nutrition and exercise program designed to reach desired caloric intake, while maintaining appropriate intake of nutrient and fiber, sodium and fats, and appropriate energy expenditure required for the weight goal.;Weight Management: Provide education and appropriate resources to help participant work on and attain dietary goals.;Weight Management/Obesity: Establish reasonable short term and long term weight goals.    Admit Weight 212 lb 14.4 oz (96.6 kg)    Goal Weight: Short Term 200 lb (90.7 kg)    Goal Weight: Long Term 150 lb (68 kg)    Expected Outcomes Long Term: Adherence to nutrition and physical activity/exercise program aimed toward attainment of established weight goal;Short Term: Continue to assess and modify interventions until short term weight is achieved;Weight Loss: Understanding of general recommendations for a balanced deficit meal plan, which promotes 1-2 lb weight loss per week and includes a negative energy balance of [phone] kcal/d;Understanding of distribution of calorie intake throughout the day with the consumption of 4-5 meals/snacks;Understanding recommendations for meals to include 15-35% energy as protein, 25-35% energy from fat, 35-60% energy from carbohydrates, less than 200mg  of dietary cholesterol, 20-35 gm of total fiber daily    Tobacco Cessation Yes    Number of packs per day 4    Intervention  Assist the participant in steps to quit. Provide individualized education and counseling about committing to Tobacco Cessation,  relapse prevention, and pharmacological support that can be provided by physician.;Education officer, environmental, assist with locating and accessing local/national Quit Smoking programs, and support quit date choice.    Expected Outcomes Short Term: Will demonstrate readiness to quit, by selecting a quit date.;Short Term: Will quit all tobacco product use, adhering to prevention of relapse plan.;Long Term: Complete abstinence from all tobacco products for at least 12 months from quit date.             Education:Diabetes - Individual verbal and written instruction to review signs/symptoms of diabetes, desired ranges of glucose level fasting, after meals and with exercise. Acknowledge that pre and post exercise glucose checks will be done for 3 sessions at entry of program.   Know Your Numbers and Heart Failure: - Group verbal and visual instruction to discuss disease risk factors for cardiac and pulmonary disease and treatment options.  Reviews associated critical values for Overweight/Obesity, Hypertension, Cholesterol, and Diabetes.  Discusses basics of heart failure: signs/symptoms and treatments.  Introduces Heart Failure Zone chart for action plan for heart failure.  Written material given at graduation.   Core Components/Risk Factors/Patient Goals Review:    Core Components/Risk Factors/Patient Goals at Discharge (Final Review):    ITP Comments:  ITP Comments     Row Name 08/30/23 1451 09/24/23 1634         ITP Comments Initial phone call completed. Diagnosis can be found in [facility] 8/19. EP Orientation scheduled for Monday 9/23 at 2:30.       Lindsay Harris is a current tobacco user. Intervention for tobacco cessation was provided at the initial medical review. She was asked about readiness to quit and reported she is interested in more information. Patient was  advised and educated about tobacco cessation using combination therapy, tobacco cessation classes, quit line, and quit smoking apps. Patient demonstrated understanding of this material. Staff will continue to provide encouragement and follow up with the patient throughout the program. Completed and gym orientation. Initial ITP created and sent for review to Dr. Jinny Sanders, Medical Director. Lindsay Harris is a current tobacco user. Intervention for tobacco cessation was provided at the initial medical review. She was asked about readiness to quit. Patient was advised and educated about tobacco cessation using combination therapy, tobacco cessation classes, quit line, and quit smoking apps. Patient demonstrated understanding of this material. Staff will continue to provide encouragement and follow up with the patient throughout the program.               Comments: Initial ITP

## 2023-09-24 NOTE — Patient Instructions (Signed)
Patient Instructions  Patient Details  Name: Lindsay Harris MRN: [id number] Date of Birth: 02/21/1976 Referring Provider:  Janann Colonel, MD  Below are your personal goals for exercise, nutrition, and risk factors. Our goal is to help you stay on track towards obtaining and maintaining these goals. We will be discussing your progress on these goals with you throughout the program.  Initial Exercise Prescription:  Initial Exercise Prescription - 09/24/23 1600       Date of Initial Exercise RX and Referring Provider   Date 09/24/23    Referring Provider Janann Colonel, MD      Oxygen   Maintain Oxygen Saturation 88% or higher      Treadmill   MPH 1.2    Grade 0    Minutes 15    METs 1.92      Recumbant Bike   Level 1    RPM 50    Watts 9    Minutes 15    METs 2.3      NuStep   Level 1    SPM 80    Minutes 15    METs 2.3      Biostep-RELP   Level 1    SPM 50    Minutes 15    METs 2.3      Prescription Details   Frequency (times per week) 2    Duration Progress to 30 minutes of continuous aerobic without signs/symptoms of physical distress      Intensity   THRR 40-80% of Max Heartrate 116-154    Ratings of Perceived Exertion 11-13    Perceived Dyspnea 0-4      Progression   Progression Continue to progress workloads to maintain intensity without signs/symptoms of physical distress.      Resistance Training   Training Prescription Yes    Weight 0 lb    Reps 10-15             Exercise Goals: Frequency: Be able to perform aerobic exercise two to three times per week in program working toward 2-5 days per week of home exercise.  Intensity: Work with a perceived exertion of 11 (fairly light) - 15 (hard) while following your exercise prescription.  We will make changes to your prescription with you as you progress through the program.   Duration: Be able to do 30 to 45 minutes of continuous aerobic exercise in addition to a 5 minute warm-up  and a 5 minute cool-down routine.   Nutrition Goals: Your personal nutrition goals will be established when you do your nutrition analysis with the dietician.  The following are general nutrition guidelines to follow: Cholesterol < 200mg /day Sodium < 1500mg /day Fiber: Women under 50 yrs - 25 grams per day  Personal Goals:  Personal Goals and Risk Factors at Admission - 09/24/23 1648       Core Components/Risk Factors/Patient Goals on Admission    Weight Management Yes;Weight Loss    Intervention Weight Management: Develop a combined nutrition and exercise program designed to reach desired caloric intake, while maintaining appropriate intake of nutrient and fiber, sodium and fats, and appropriate energy expenditure required for the weight goal.;Weight Management: Provide education and appropriate resources to help participant work on and attain dietary goals.;Weight Management/Obesity: Establish reasonable short term and long term weight goals.    Admit Weight 212 lb 14.4 oz (96.6 kg)    Goal Weight: Short Term 200 lb (90.7 kg)    Goal Weight: Long Term 150 lb (68 kg)  Expected Outcomes Long Term: Adherence to nutrition and physical activity/exercise program aimed toward attainment of established weight goal;Short Term: Continue to assess and modify interventions until short term weight is achieved;Weight Loss: Understanding of general recommendations for a balanced deficit meal plan, which promotes 1-2 lb weight loss per week and includes a negative energy balance of [phone] kcal/d;Understanding of distribution of calorie intake throughout the day with the consumption of 4-5 meals/snacks;Understanding recommendations for meals to include 15-35% energy as protein, 25-35% energy from fat, 35-60% energy from carbohydrates, less than 200mg  of dietary cholesterol, 20-35 gm of total fiber daily    Tobacco Cessation Yes    Number of packs per day 4    Intervention Assist the participant in steps  to quit. Provide individualized education and counseling about committing to Tobacco Cessation, relapse prevention, and pharmacological support that can be provided by physician.;Education officer, environmental, assist with locating and accessing local/national Quit Smoking programs, and support quit date choice.    Expected Outcomes Short Term: Will demonstrate readiness to quit, by selecting a quit date.;Short Term: Will quit all tobacco product use, adhering to prevention of relapse plan.;Long Term: Complete abstinence from all tobacco products for at least 12 months from quit date.             Tobacco Use Initial Evaluation: Social History   Tobacco Use  Smoking Status Every Day   Current packs/day: 1.00   Types: Cigarettes  Smokeless Tobacco Not on file  Tobacco Comments   Has smoked since age 7    Exercise Goals and Review:  Exercise Goals     Row Name 09/24/23 1640             Exercise Goals   Increase Physical Activity Yes       Intervention Provide advice, education, support and counseling about physical activity/exercise needs.;Develop an individualized exercise prescription for aerobic and resistive training based on initial evaluation findings, risk stratification, comorbidities and participant's personal goals.       Expected Outcomes Short Term: Attend rehab on a regular basis to increase amount of physical activity.;Long Term: Exercising regularly at least 3-5 days a week.;Long Term: Add in home exercise to make exercise part of routine and to increase amount of physical activity.       Increase Strength and Stamina Yes       Intervention Provide advice, education, support and counseling about physical activity/exercise needs.;Develop an individualized exercise prescription for aerobic and resistive training based on initial evaluation findings, risk stratification, comorbidities and participant's personal goals.       Expected Outcomes Short Term: Increase workloads  from initial exercise prescription for resistance, speed, and METs.;Short Term: Perform resistance training exercises routinely during rehab and add in resistance training at home;Long Term: Improve cardiorespiratory fitness, muscular endurance and strength as measured by increased METs and functional capacity ( )       Able to understand and use rate of perceived exertion (RPE) scale Yes       Intervention Provide education and explanation on how to use RPE scale       Expected Outcomes Short Term: Able to use RPE daily in rehab to express subjective intensity level;Long Term:  Able to use RPE to guide intensity level when exercising independently       Able to understand and use Dyspnea scale Yes       Intervention Provide education and explanation on how to use Dyspnea scale       Expected Outcomes  Short Term: Able to use Dyspnea scale daily in rehab to express subjective sense of shortness of breath during exertion;Long Term: Able to use Dyspnea scale to guide intensity level when exercising independently       Knowledge and understanding of Target Heart Rate Range (THRR) Yes       Intervention Provide education and explanation of THRR including how the numbers were predicted and where they are located for reference       Expected Outcomes Short Term: Able to state/look up THRR;Long Term: Able to use THRR to govern intensity when exercising independently;Short Term: Able to use daily as guideline for intensity in rehab       Able to check pulse independently Yes       Intervention Provide education and demonstration on how to check pulse in carotid and radial arteries.;Review the importance of being able to check your own pulse for safety during independent exercise       Expected Outcomes Short Term: Able to explain why pulse checking is important during independent exercise;Long Term: Able to check pulse independently and accurately       Understanding of Exercise Prescription Yes        Intervention Provide education, explanation, and written materials on patient's individual exercise prescription       Expected Outcomes Short Term: Able to explain program exercise prescription;Long Term: Able to explain home exercise prescription to exercise independently

## 2023-09-26 ENCOUNTER — Other Ambulatory Visit: Payer: Self-pay | Admitting: Gastroenterology

## 2023-09-27 ENCOUNTER — Encounter: Payer: Medicaid Other | Admitting: *Deleted

## 2023-09-27 DIAGNOSIS — J4551 Severe persistent asthma with (acute) exacerbation: Secondary | ICD-10-CM | POA: Diagnosis not present

## 2023-09-27 NOTE — Progress Notes (Signed)
Daily Session Note  Patient Details  Name: Lindsay Harris MRN: [id number] Date of Birth: 08-17-1976 Referring Provider:   Flowsheet Row Pulmonary Rehab from 09/24/2023 in [facility] Cardiac and Pulmonary Rehab  Referring Provider Janann Colonel, MD       Encounter Date: 09/27/2023  Check In:  Session Check In - 09/27/23 1707       Check-In   Supervising physician immediately available to respond to emergencies See telemetry face sheet for immediately available ER MD    Location [facility]-Cardiac & Pulmonary Rehab    Staff Present Maxon Suzzette Righter, , Exercise Physiologist;Norman Bier Jewel Baize, RN BSN;Joseph Reino Kent, RCP,RRT,BSRT    Virtual Visit No    Medication changes reported     No    Fall or balance concerns reported    No    Tobacco Cessation Use Increase    Current number of cigarettes/nicotine per day     13    Warm-up and Cool-down Performed on first and last piece of equipment    Resistance Training Performed Yes    VAD Patient? No    PAD/SET Patient? No      Pain Assessment   Currently in Pain? No/denies                Social History   Tobacco Use  Smoking Status Every Day   Current packs/day: 1.00   Types: Cigarettes  Smokeless Tobacco Not on file  Tobacco Comments   Has smoked since age 89    Goals Met:  Independence with exercise equipment Exercise tolerated well No report of concerns or symptoms today Strength training completed today  Goals Unmet:  Not Applicable  Comments: First full day of exercise!  Patient was oriented to gym and equipment including functions, settings, policies, and procedures.  Patient's individual exercise prescription and treatment plan were reviewed.  All starting workloads were established based on the results of the 6 minute walk test done at initial orientation visit.  The plan for exercise progression was also introduced and progression will be customized based on patient's performance and goals.     Dr. Bethann Punches is  Medical Director for [facility] Cardiac Rehabilitation.  Dr. Vida Rigger is Medical Director for [facility] Pulmonary Rehabilitation.

## 2023-10-08 ENCOUNTER — Encounter: Payer: Medicaid Other | Admitting: *Deleted

## 2023-10-08 DIAGNOSIS — J4551 Severe persistent asthma with (acute) exacerbation: Secondary | ICD-10-CM | POA: Diagnosis not present

## 2023-10-08 NOTE — Progress Notes (Signed)
Daily Session Note  Patient Details  Name: Lindsay Harris MRN: [id number] Date of Birth: August 15, 1976 Referring Provider:   Flowsheet Row Pulmonary Rehab from 09/24/2023 in [facility] Cardiac and Pulmonary Rehab  Referring Provider Janann Colonel, MD       Encounter Date: 10/08/2023  Check In:  Session Check In - 10/08/23 1716       Check-In   Supervising physician immediately available to respond to emergencies See telemetry face sheet for immediately available ER MD    Location [facility]-Cardiac & Pulmonary Rehab    Staff Present Tommye Standard, BS, ACSM CEP, Exercise Physiologist;Feather Berrie Jewel Baize, RN BSN;Joseph Reino Kent, RCP,RRT,BSRT    Virtual Visit No    Medication changes reported     No    Fall or balance concerns reported    No    Tobacco Cessation No Change    Warm-up and Cool-down Performed on first and last piece of equipment    Resistance Training Performed Yes    VAD Patient? No    PAD/SET Patient? No      Pain Assessment   Currently in Pain? No/denies                Social History   Tobacco Use  Smoking Status Every Day   Current packs/day: 1.00   Types: Cigarettes  Smokeless Tobacco Not on file  Tobacco Comments   Has smoked since age 10    Goals Met:  Independence with exercise equipment Exercise tolerated well No report of concerns or symptoms today Strength training completed today  Goals Unmet:  Not Applicable  Comments: Pt able to follow exercise prescription today without complaint.  Will continue to monitor for progression.    Dr. Bethann Punches is Medical Director for [facility] Cardiac Rehabilitation.  Dr. Vida Rigger is Medical Director for [facility] Pulmonary Rehabilitation.

## 2023-10-09 ENCOUNTER — Encounter: Payer: Self-pay | Admitting: Pulmonary Disease

## 2023-10-15 ENCOUNTER — Ambulatory Visit: Payer: Self-pay | Admitting: Psychiatry

## 2023-10-17 ENCOUNTER — Encounter: Payer: Self-pay | Admitting: *Deleted

## 2023-10-17 DIAGNOSIS — J4551 Severe persistent asthma with (acute) exacerbation: Secondary | ICD-10-CM

## 2023-10-17 NOTE — Progress Notes (Signed)
Pulmonary Individual Treatment Plan  Patient Details  Name: Lindsay Harris MRN: [id number] Date of Birth: 30-Sep-1976 Referring Provider:   Flowsheet Row Pulmonary Rehab from 09/24/2023 in [facility] Cardiac and Pulmonary Rehab  Referring Provider Janann Colonel, MD       Initial Encounter Date:  Flowsheet Row Pulmonary Rehab from 09/24/2023 in [facility] Cardiac and Pulmonary Rehab  Date 09/24/23       Visit Diagnosis: Severe persistent asthma with acute exacerbation  Patient's Home Medications on Admission:  Current Outpatient Medications:    albuterol (PROVENTIL) (2.5 MG/3ML) 0.083% nebulizer solution, Take 2.5 mg by nebulization every 4 (four) hours as needed., Disp: , Rfl:    albuterol (VENTOLIN HFA) 108 (90 Base) MCG/ACT inhaler, Inhale 2 puffs into the lungs every 6 (six) hours as needed., Disp: , Rfl:    Albuterol-Budesonide (AIRSUPRA) 90-80 MCG/ACT AERO, Inhale 2 puffs into the lungs every 6 (six) hours., Disp: 1 g, Rfl: 3   budesonide-formoterol (SYMBICORT) 160-4.5 MCG/ACT inhaler, Inhale 2 puffs into the lungs in the morning and at bedtime., Disp: 1 each, Rfl: 12   Elagolix Sodium (ORILISSA) 200 MG TABS, Take 1 tablet (200 mg total) by mouth 2 (two) times daily., Disp: 60 tablet, Rfl: 6   EPINEPHrine 0.3 mg/0.3 mL IJ SOAJ injection, Inject 0.3 mg into the muscle as needed for anaphylaxis., Disp: , Rfl:    linaclotide (LINZESS) 290 MCG CAPS capsule, TAKE 1 CAPSULE BY MOUTH DAILY BEFORE BREAKFAST., Disp: 90 capsule, Rfl: 0   methocarbamol (ROBAXIN) 500 MG tablet, Take 500 mg by mouth every 8 (eight) hours as needed., Disp: , Rfl:    montelukast (SINGULAIR) 10 MG tablet, Take 10 mg by mouth at bedtime., Disp: , Rfl:    nicotine (NICODERM CQ - DOSED IN MG/24 HOURS) 14 mg/24hr patch, Place 1 patch (14 mg total) onto the skin daily., Disp: 30 patch, Rfl: 0   nicotine polacrilex (NICOTINE MINI) 2 MG lozenge, Take 1 lozenge (2 mg total) by mouth as needed for smoking cessation., Disp: 100  tablet, Rfl: 0   ondansetron (ZOFRAN) 4 mg TABS tablet, Take by mouth every 8 (eight) hours as needed., Disp: , Rfl:    pantoprazole (PROTONIX) 40 MG tablet, Take 40 mg by mouth daily., Disp: , Rfl:    propranolol ER (INDERAL LA) 80 MG 24 hr capsule, Take 80 mg by mouth daily., Disp: , Rfl:    sertraline (ZOLOFT) 100 MG tablet, Take 100 mg by mouth daily., Disp: , Rfl:    sertraline (ZOLOFT) 50 MG tablet, Take 50 mg by mouth daily., Disp: , Rfl:    SPIRIVA HANDIHALER 18 MCG inhalation capsule, Place 18 mcg into inhaler and inhale daily., Disp: , Rfl:    SUMAtriptan (IMITREX) 25 MG tablet, Take 25 mg by mouth every 2 (two) hours as needed., Disp: , Rfl:    SYMBICORT 80-4.5 MCG/ACT inhaler, Inhale 2 puffs into the lungs., Disp: , Rfl:    triamcinolone cream (KENALOG) 0.1 %, Apply topically 2 (two) times daily., Disp: , Rfl:   Past Medical History: Past Medical History:  Diagnosis Date   Asthma    Dyspnea    GERD (gastroesophageal reflux disease)    Headache    migraines    Tobacco Use: Social History   Tobacco Use  Smoking Status Every Day   Current packs/day: 1.00   Types: Cigarettes  Smokeless Tobacco Not on file  Tobacco Comments   Has smoked since age 42    Labs: Review Flowsheet  Latest Ref Rng & Units 09/19/2018 07/23/2023 09/04/2023  Labs for ITP Cardiac and Pulmonary Rehab  Cholestrol 100 - 199 mg/dL - - 630   LDL (calc) 0 - 99 mg/dL - - 160   HDL-C >10 mg/dL - - 50   Trlycerides 0 - 149 mg/dL - - 932   Hemoglobin T5T 4.8 - 5.6 % - - 6.1   Bicarbonate 20.0 - 28.0 mmol/L - 23.1  -  TCO2 22 - 32 mmol/L 20  - -  O2 Saturation % - 81.5  -    Details             Pulmonary Assessment Scores:  Pulmonary Assessment Scores     Row Name 09/24/23 1642         ADL UCSD   ADL Phase Entry     SOB Score total 117     Rest 5     Walk 5     Stairs 5     Bath 5     Dress 5     Shop 5       CAT Score   CAT Score 40       mMRC Score   mMRC Score 4               UCSD: Self-administered rating of dyspnea associated with activities of daily living (ADLs) 6-point scale (0 = "not at all" to 5 = "maximal or unable to do because of breathlessness")  Scoring Scores range from 0 to 120.  Minimally important difference is 5 units  CAT: CAT can identify the health impairment of COPD patients and is better correlated with disease progression.  CAT has a scoring range of zero to 40. The CAT score is classified into four groups of low (less than 10), medium (10 - 20), high (21-30) and very high (31-40) based on the impact level of disease on health status. A CAT score over 10 suggests significant symptoms.  A worsening CAT score could be explained by an exacerbation, poor medication adherence, poor inhaler technique, or progression of COPD or comorbid conditions.  CAT MCID is 2 points  mMRC: mMRC (Modified Medical Research Council) Dyspnea Scale is used to assess the degree of baseline functional disability in patients of respiratory disease due to dyspnea. No minimal important difference is established. A decrease in score of 1 point or greater is considered a positive change.   Pulmonary Function Assessment:   Exercise Target Goals: Exercise Program Goal: Individual exercise prescription set using results from initial 6 min walk test and THRR while considering  patient's activity barriers and safety.   Exercise Prescription Goal: Initial exercise prescription builds to 30-45 minutes a day of aerobic activity, 2-3 days per week.  Home exercise guidelines will be given to patient during program as part of exercise prescription that the participant will acknowledge.  Education: Aerobic Exercise: - Group verbal and visual presentation on the components of exercise prescription. Introduces F.I.T.T principle from ACSM for exercise prescriptions.  Reviews F.I.T.T. principles of aerobic exercise including progression. Written material given at  graduation.   Education: Resistance Exercise: - Group verbal and visual presentation on the components of exercise prescription. Introduces F.I.T.T principle from ACSM for exercise prescriptions  Reviews F.I.T.T. principles of resistance exercise including progression. Written material given at graduation.    Education: Exercise & Equipment Safety: - Individual verbal instruction and demonstration of equipment use and safety with use of the equipment. Flowsheet Row Pulmonary Rehab from 09/24/2023  in [facility] Cardiac and Pulmonary Rehab  Date 09/24/23  Educator MB  Instruction Review Code 1- Verbalizes Understanding       Education: Exercise Physiology & General Exercise Guidelines: - Group verbal and written instruction with models to review the exercise physiology of the cardiovascular system and associated critical values. Provides general exercise guidelines with specific guidelines to those with heart or lung disease.    Education: Flexibility, Balance, Mind/Body Relaxation: - Group verbal and visual presentation with interactive activity on the components of exercise prescription. Introduces F.I.T.T principle from ACSM for exercise prescriptions. Reviews F.I.T.T. principles of flexibility and balance exercise training including progression. Also discusses the mind body connection.  Reviews various relaxation techniques to help reduce and manage stress (i.e. Deep breathing, progressive muscle relaxation, and visualization). Balance handout provided to take home. Written material given at graduation.   Activity Barriers & Risk Stratification:  Activity Barriers & Cardiac Risk Stratification - 09/24/23 1637       Activity Barriers & Cardiac Risk Stratification   Activity Barriers Back Problems;Balance Concerns             6 Minute Walk:  6 Minute Walk     Row Name 09/24/23 1634         6 Minute Walk   Phase Initial     Distance 600 feet     Walk Time 6 minutes     # of  Rest Breaks 0     MPH 1.14     METS 2.3     RPE 19     Perceived Dyspnea  4     VO2 Peak 8.04     Symptoms Yes (comment)     Comments SOB, coughing, and back pain     Resting HR 78 bpm     Resting BP 116/76     Resting Oxygen Saturation  88 %     Exercise Oxygen Saturation  during 6 min walk 84 %     Max Ex. HR 83 bpm     Max Ex. BP 124/72     2 Minute Post BP 102/70       Interval HR   1 Minute HR 83     2 Minute HR 81     3 Minute HR 77     4 Minute HR 76     5 Minute HR 79     6 Minute HR 78     2 Minute Post HR 74     Interval Heart Rate? Yes       Interval Oxygen   Interval Oxygen? Yes     Baseline Oxygen Saturation % 88 %     1 Minute Oxygen Saturation % 85 %     1 Minute Liters of Oxygen 2 L     2 Minute Oxygen Saturation % 85 %     2 Minute Liters of Oxygen 2 L     3 Minute Oxygen Saturation % 84 %     3 Minute Liters of Oxygen 2 L     4 Minute Oxygen Saturation % 84 %     4 Minute Liters of Oxygen 2 L     5 Minute Oxygen Saturation % 84 %     5 Minute Liters of Oxygen 2 L     6 Minute Oxygen Saturation % 86 %     6 Minute Liters of Oxygen 2 L     2 Minute Post Oxygen Saturation % 86 %  2 Minute Post Liters of Oxygen 2 L             Oxygen Initial Assessment:  Oxygen Initial Assessment - 08/30/23 1500       Home Oxygen   Home Oxygen Device None    Sleep Oxygen Prescription None    Home Exercise Oxygen Prescription None    Home Resting Oxygen Prescription None    Compliance with Home Oxygen Use Yes      Intervention   Short Term Goals To learn and understand importance of monitoring SPO2 with pulse oximeter and demonstrate accurate use of the pulse oximeter.;To learn and demonstrate proper pursed lip breathing techniques or other breathing techniques. ;To learn and understand importance of maintaining oxygen saturations>88%;To learn and demonstrate proper use of respiratory medications    Long  Term Goals Verbalizes importance of monitoring SPO2  with pulse oximeter and return demonstration;Maintenance of O2 saturations>88%;Exhibits proper breathing techniques, such as pursed lip breathing or other method taught during program session;Compliance with respiratory medication;Demonstrates proper use of MDI's             Oxygen Re-Evaluation:  Oxygen Re-Evaluation     Row Name 09/27/23 1711 10/08/23 1726           Program Oxygen Prescription   Program Oxygen Prescription -- Continuous;E-Tanks      Liters per minute -- 2        Home Oxygen   Home Oxygen Device -- Home Concentrator;E-Tanks      Sleep Oxygen Prescription -- Continuous  getting a CPAP      Liters per minute -- 2      Home Exercise Oxygen Prescription -- Continuous      Liters per minute -- 2      Home Resting Oxygen Prescription -- Continuous      Liters per minute -- 2      Compliance with Home Oxygen Use -- Yes        Goals/Expected Outcomes   Short Term Goals -- To learn and demonstrate proper pursed lip breathing techniques or other breathing techniques.       Long  Term Goals -- Exhibits proper breathing techniques, such as pursed lip breathing or other method taught during program session      Comments Reviewed PLB technique with pt.  Talked about how it works and it's importance in maintaining their exercise saturations. Informed patient how to perform the Pursed Lipped breathing technique. Told patient to Inhale through the nose and out the mouth with pursed lips to keep their airways open, help oxygenate them better, practice when at rest or doing strenuous activity. Patient Verbalizes understanding of technique and will work on and be reiterated during [facility].      Goals/Expected Outcomes Short: Become more profiecient at using PLB.   Long: Become independent at using PLB. Short: use PLB with exertion. Long: use PLB on exertion proficiently and independently.               Oxygen Discharge (Final Oxygen Re-Evaluation):  Oxygen Re-Evaluation -  10/08/23 1726       Program Oxygen Prescription   Program Oxygen Prescription Continuous;E-Tanks    Liters per minute 2      Home Oxygen   Home Oxygen Device Home Concentrator;E-Tanks    Sleep Oxygen Prescription Continuous   getting a CPAP   Liters per minute 2    Home Exercise Oxygen Prescription Continuous    Liters per minute 2    Home  Resting Oxygen Prescription Continuous    Liters per minute 2    Compliance with Home Oxygen Use Yes      Goals/Expected Outcomes   Short Term Goals To learn and demonstrate proper pursed lip breathing techniques or other breathing techniques.     Long  Term Goals Exhibits proper breathing techniques, such as pursed lip breathing or other method taught during program session    Comments Informed patient how to perform the Pursed Lipped breathing technique. Told patient to Inhale through the nose and out the mouth with pursed lips to keep their airways open, help oxygenate them better, practice when at rest or doing strenuous activity. Patient Verbalizes understanding of technique and will work on and be reiterated during [facility].    Goals/Expected Outcomes Short: use PLB with exertion. Long: use PLB on exertion proficiently and independently.             Initial Exercise Prescription:  Initial Exercise Prescription - 09/24/23 1600       Date of Initial Exercise RX and Referring Provider   Date 09/24/23    Referring Provider Janann Colonel, MD      Oxygen   Maintain Oxygen Saturation 88% or higher      Treadmill   MPH 1.2    Grade 0    Minutes 15    METs 1.92      Recumbant Bike   Level 1    RPM 50    Watts 9    Minutes 15    METs 2.3      NuStep   Level 1    SPM 80    Minutes 15    METs 2.3      Biostep-RELP   Level 1    SPM 50    Minutes 15    METs 2.3      Prescription Details   Frequency (times per week) 2    Duration Progress to 30 minutes of continuous aerobic without signs/symptoms of physical distress       Intensity   THRR 40-80% of Max Heartrate 116-154    Ratings of Perceived Exertion 11-13    Perceived Dyspnea 0-4      Progression   Progression Continue to progress workloads to maintain intensity without signs/symptoms of physical distress.      Resistance Training   Training Prescription Yes    Weight 0 lb    Reps 10-15             Perform Capillary Blood Glucose checks as needed.  Exercise Prescription Changes:   Exercise Prescription Changes     Row Name 09/24/23 1600 10/05/23 0900           Response to Exercise   Blood Pressure (Admit) 116/76 102/58      Blood Pressure (Exercise) 124/72 110/62      Blood Pressure (Exit) 102/70 106/58      Heart Rate (Admit) 78 bpm 68 bpm      Heart Rate (Exercise) 83 bpm 83 bpm      Heart Rate (Exit) 73 bpm 83 bpm      Oxygen Saturation (Admit) 88 % 95 %      Oxygen Saturation (Exercise) 84 % 94 %      Oxygen Saturation (Exit) 85 % 94 %      Rating of Perceived Exertion (Exercise) 19 13      Perceived Dyspnea (Exercise) 4 1      Symptoms SOB, coughing, and back pain  SOB      Comments results First full day of exercise      Duration Progress to 30 minutes of  aerobic without signs/symptoms of physical distress Progress to 30 minutes of  aerobic without signs/symptoms of physical distress      Intensity THRR New THRR unchanged        Progression   Progression Continue to progress workloads to maintain intensity without signs/symptoms of physical distress. Continue to progress workloads to maintain intensity without signs/symptoms of physical distress.      Average METs 2.3 2.15        Resistance Training   Training Prescription -- Yes      Weight -- 0 lb      Reps -- 10-15        Interval Training   Interval Training -- No        Recumbant Bike   Level -- 1      Watts -- 14      Minutes -- 15      METs -- 2.29        NuStep   Level -- 1      Minutes -- 15      METs -- 2        Oxygen   Maintain  Oxygen Saturation -- 88% or higher               Exercise Comments:   Exercise Comments     Row Name 09/27/23 1710           Exercise Comments First full day of exercise!  Patient was oriented to gym and equipment including functions, settings, policies, and procedures.  Patient's individual exercise prescription and treatment plan were reviewed.  All starting workloads were established based on the results of the 6 minute walk test done at initial orientation visit.  The plan for exercise progression was also introduced and progression will be customized based on patient's performance and goals.                Exercise Goals and Review:   Exercise Goals     Row Name 09/24/23 1640             Exercise Goals   Increase Physical Activity Yes       Intervention Provide advice, education, support and counseling about physical activity/exercise needs.;Develop an individualized exercise prescription for aerobic and resistive training based on initial evaluation findings, risk stratification, comorbidities and participant's personal goals.       Expected Outcomes Short Term: Attend rehab on a regular basis to increase amount of physical activity.;Long Term: Exercising regularly at least 3-5 days a week.;Long Term: Add in home exercise to make exercise part of routine and to increase amount of physical activity.       Increase Strength and Stamina Yes       Intervention Provide advice, education, support and counseling about physical activity/exercise needs.;Develop an individualized exercise prescription for aerobic and resistive training based on initial evaluation findings, risk stratification, comorbidities and participant's personal goals.       Expected Outcomes Short Term: Increase workloads from initial exercise prescription for resistance, speed, and METs.;Short Term: Perform resistance training exercises routinely during rehab and add in resistance training at home;Long  Term: Improve cardiorespiratory fitness, muscular endurance and strength as measured by increased METs and functional capacity ( )       Able to understand and use rate of perceived exertion (RPE) scale Yes  Intervention Provide education and explanation on how to use RPE scale       Expected Outcomes Short Term: Able to use RPE daily in rehab to express subjective intensity level;Long Term:  Able to use RPE to guide intensity level when exercising independently       Able to understand and use Dyspnea scale Yes       Intervention Provide education and explanation on how to use Dyspnea scale       Expected Outcomes Short Term: Able to use Dyspnea scale daily in rehab to express subjective sense of shortness of breath during exertion;Long Term: Able to use Dyspnea scale to guide intensity level when exercising independently       Knowledge and understanding of Target Heart Rate Range (THRR) Yes       Intervention Provide education and explanation of THRR including how the numbers were predicted and where they are located for reference       Expected Outcomes Short Term: Able to state/look up THRR;Long Term: Able to use THRR to govern intensity when exercising independently;Short Term: Able to use daily as guideline for intensity in rehab       Able to check pulse independently Yes       Intervention Provide education and demonstration on how to check pulse in carotid and radial arteries.;Review the importance of being able to check your own pulse for safety during independent exercise       Expected Outcomes Short Term: Able to explain why pulse checking is important during independent exercise;Long Term: Able to check pulse independently and accurately       Understanding of Exercise Prescription Yes       Intervention Provide education, explanation, and written materials on patient's individual exercise prescription       Expected Outcomes Short Term: Able to explain program exercise  prescription;Long Term: Able to explain home exercise prescription to exercise independently                Exercise Goals Re-Evaluation :  Exercise Goals Re-Evaluation     Row Name 09/27/23 1710 10/05/23 0931           Exercise Goal Re-Evaluation   Exercise Goals Review Able to understand and use rate of perceived exertion (RPE) scale;Increase Physical Activity;Knowledge and understanding of Target Heart Rate Range (THRR);Understanding of Exercise Prescription;Increase Strength and Stamina;Able to understand and use Dyspnea scale;Able to check pulse independently Increase Physical Activity;Increase Strength and Stamina;Understanding of Exercise Prescription      Comments Reviewed RPE and dyspnea scale, THR and program prescription with pt today.  Pt voiced understanding and was given a copy of goals to take home. Lindsay Harris is off to a good start in the program. She was able to work at level 1 on the recumbent bike and T4 nustep during her first rehab session. She did not do any walking during her first session. She also did range of motion for resistance training exercises. We will continue to monitor her progress in the program.      Expected Outcomes Short: Use RPE daily to regulate intensity.  Long: Follow program prescription in THR. Short: Continue to follow initial exercise prescription. Long: Continue exercise to improve strength and stamina.               Discharge Exercise Prescription (Final Exercise Prescription Changes):  Exercise Prescription Changes - 10/05/23 0900       Response to Exercise   Blood Pressure (Admit) 102/58  Blood Pressure (Exercise) 110/62    Blood Pressure (Exit) 106/58    Heart Rate (Admit) 68 bpm    Heart Rate (Exercise) 83 bpm    Heart Rate (Exit) 83 bpm    Oxygen Saturation (Admit) 95 %    Oxygen Saturation (Exercise) 94 %    Oxygen Saturation (Exit) 94 %    Rating of Perceived Exertion (Exercise) 13    Perceived Dyspnea (Exercise) 1     Symptoms SOB    Comments First full day of exercise    Duration Progress to 30 minutes of  aerobic without signs/symptoms of physical distress    Intensity THRR unchanged      Progression   Progression Continue to progress workloads to maintain intensity without signs/symptoms of physical distress.    Average METs 2.15      Resistance Training   Training Prescription Yes    Weight 0 lb    Reps 10-15      Interval Training   Interval Training No      Recumbant Bike   Level 1    Watts 14    Minutes 15    METs 2.29      NuStep   Level 1    Minutes 15    METs 2      Oxygen   Maintain Oxygen Saturation 88% or higher             Nutrition:  Target Goals: Understanding of nutrition guidelines, daily intake of sodium 1500mg , cholesterol 200mg , calories 30% from fat and 7% or less from saturated fats, daily to have 5 or more servings of fruits and vegetables.  Education: All About Nutrition: -Group instruction provided by verbal, written material, interactive activities, discussions, models, and posters to present general guidelines for heart healthy nutrition including fat, fiber, MyPlate, the role of sodium in heart healthy nutrition, utilization of the nutrition label, and utilization of this knowledge for meal planning. Follow up email sent as well. Written material given at graduation.   Biometrics:  Pre Biometrics - 09/24/23 1640       Pre Biometrics   Height 5' 4.8" (1.646 m)    Weight 212 lb 14.4 oz (96.6 kg)    Waist Circumference 42 inches    Hip Circumference 46 inches    Waist to Hip Ratio 0.91 %    BMI (Calculated) 35.64    Single Leg Stand 30 seconds              Nutrition Therapy Plan and Nutrition Goals:  Nutrition Therapy & Goals - 09/24/23 1645       Personal Nutrition Goals   Nutrition Goal Meet with RD on 10/14      Intervention Plan   Intervention Prescribe, educate and counsel regarding individualized specific dietary  modifications aiming towards targeted core components such as weight, hypertension, lipid management, diabetes, heart failure and other comorbidities.;Nutrition handout(s) given to patient.    Expected Outcomes Short Term Goal: Understand basic principles of dietary content, such as calories, fat, sodium, cholesterol and nutrients.;Long Term Goal: Adherence to prescribed nutrition plan.;Short Term Goal: A plan has been developed with personal nutrition goals set during dietitian appointment.             Nutrition Assessments:  MEDIFICTS Score Key: >=70 Need to make dietary changes  40-70 Heart Healthy Diet <= 40 Therapeutic Level Cholesterol Diet  Flowsheet Row Pulmonary Rehab from 09/24/2023 in [facility] Cardiac and Pulmonary Rehab  Picture Your Plate Total Score  on Admission 51      Picture Your Plate Scores: <60 Unhealthy dietary pattern with much room for improvement. 41-50 Dietary pattern unlikely to meet recommendations for good health and room for improvement. 51-60 More healthful dietary pattern, with some room for improvement.  >60 Healthy dietary pattern, although there may be some specific behaviors that could be improved.   Nutrition Goals Re-Evaluation:  Nutrition Goals Re-Evaluation     Row Name 10/08/23 1725             Goals   Comment Patient was informed on why it is important to maintain a balanced diet when dealing with Respiratory issues. Explained that it takes a lot of energy to breath and when they are short of breath often they will need to have a good diet to help keep up with the calories they are expending for breathing.       Expected Outcome Short: Choose and plan snacks accordingly to patients caloric intake to improve breathing. Long: Maintain a diet independently that meets their caloric intake to aid in daily shortness of breath.                Nutrition Goals Discharge (Final Nutrition Goals Re-Evaluation):  Nutrition Goals Re-Evaluation -  10/08/23 1725       Goals   Comment Patient was informed on why it is important to maintain a balanced diet when dealing with Respiratory issues. Explained that it takes a lot of energy to breath and when they are short of breath often they will need to have a good diet to help keep up with the calories they are expending for breathing.    Expected Outcome Short: Choose and plan snacks accordingly to patients caloric intake to improve breathing. Long: Maintain a diet independently that meets their caloric intake to aid in daily shortness of breath.             Psychosocial: Target Goals: Acknowledge presence or absence of significant depression and/or stress, maximize coping skills, provide positive support system. Participant is able to verbalize types and ability to use techniques and skills needed for reducing stress and depression.   Education: Stress, Anxiety, and Depression - Group verbal and visual presentation to define topics covered.  Reviews how body is impacted by stress, anxiety, and depression.  Also discusses healthy ways to reduce stress and to treat/manage anxiety and depression.  Written material given at graduation.   Education: Sleep Hygiene -Provides group verbal and written instruction about how sleep can affect your health.  Define sleep hygiene, discuss sleep cycles and impact of sleep habits. Review good sleep hygiene tips.    Initial Review & Psychosocial Screening:  Initial Psych Review & Screening - 08/30/23 1441       Initial Review   Current issues with History of Depression;Current Anxiety/Panic;Current Stress Concerns;Current Sleep Concerns    Source of Stress Concerns Chronic Illness    Comments colon cancer/cervical cancer possibility; new sleep apnea      Family Dynamics   Good Support System? Yes   family     Barriers   Psychosocial barriers to participate in program There are no identifiable barriers or psychosocial needs.;The patient should  benefit from training in stress management and relaxation.      Screening Interventions   Interventions Encouraged to exercise;To provide support and resources with identified psychosocial needs    Expected Outcomes Short Term goal: Utilizing psychosocial counselor, staff and physician to assist with identification of specific Stressors  or current issues interfering with healing process. Setting desired goal for each stressor or current issue identified.;Long Term Goal: Stressors or current issues are controlled or eliminated.;Short Term goal: Identification and review with participant of any Quality of Life or Depression concerns found by scoring the questionnaire.;Long Term goal: The participant improves quality of Life and PHQ9 Scores as seen by post scores and/or verbalization of changes             Quality of Life Scores:  Scores of 19 and below usually indicate a poorer quality of life in these areas.  A difference of  2-3 points is a clinically meaningful difference.  A difference of 2-3 points in the total score of the Quality of Life Index has been associated with significant improvement in overall quality of life, self-image, physical symptoms, and general health in studies assessing change in quality of life.  PHQ-9: Review Flowsheet       09/24/2023 06/29/2023  Depression screen PHQ 2/9  Decreased Interest 0 3  Down, Depressed, Hopeless 0 1  PHQ - 2 Score 0 4  Altered sleeping 0 3  Tired, decreased energy 2 3  Change in appetite 0 3  Feeling bad or failure about yourself  0 3  Trouble concentrating 0 3  Moving slowly or fidgety/restless 0 3  Suicidal thoughts 0 0  PHQ-9 Score 2 22  Difficult doing work/chores Extremely dIfficult Extremely dIfficult    Details           Interpretation of Total Score  Total Score Depression Severity:  1-4 = Minimal depression, 5-9 = Mild depression, 10-14 = Moderate depression, 15-19 = Moderately severe depression, 20-27 = Severe  depression   Psychosocial Evaluation and Intervention:  Psychosocial Evaluation - 08/30/23 1451       Psychosocial Evaluation & Interventions   Interventions Encouraged to exercise with the program and follow exercise prescription;Stress management education;Relaxation education    Comments Graciana is coming to pulmonary rehab with chronic asthma. She has been recently diagnosed with sleep apnea, colon biopsies and cervical markers concerning for cancer. She also has 7 herniated discs which she is waiting for the inflammation to improve before they can do any type of intervention. Her husband and daughters are very supportive. She is motivated to stop smoking and has cut back to 1 pack a day. She is working with her doctor to help manage her depression and anxiety. Her current dose of zoloft does not feel to be working, so she is seeing her doctor tomorrow. Recently she has gained weight with unknown reason and she wants to get back to her normal weight. She is motivated to come to the program to work on bettering her health and improving her stamina    Expected Outcomes Short: attend pulmonary rehab for education and exercise. Long: develop and maintain positive self care habits    Continue Psychosocial Services  Follow up required by staff             Psychosocial Re-Evaluation:   Psychosocial Discharge (Final Psychosocial Re-Evaluation):   Education: Education Goals: Education classes will be provided on a weekly basis, covering required topics. Participant will state understanding/return demonstration of topics presented.  Learning Barriers/Preferences:  Learning Barriers/Preferences - 08/30/23 1441       Learning Barriers/Preferences   Learning Barriers None    Learning Preferences None             General Pulmonary Education Topics:  Infection Prevention: - Provides verbal and written  material to individual with discussion of infection control including proper hand  washing and proper equipment cleaning during exercise session. Flowsheet Row Pulmonary Rehab from 09/24/2023 in [facility] Re[facility]  Date 09/24/23  Educator MB  Instruction Review Code 1- Verbalizes Understanding       Falls Prevention: - Provides verbal and written material to individual with discussion of falls prevention and safety. Flowsheet Row Pulmonary Rehab from 09/24/2023 in [facility] Pulmonary Rehab  Date 09/24/23  Educator MB  Instruction Review Code 1- Verbalizes Understanding       Chronic Lung Disease Review: - Group verbal instruction with posters, models, PowerPoint presentations and videos,  to review new updates, new respiratory medications, new advancements in procedures and treatments. Providing information on websites and "800" numbers for continued self-education. Includes information about supplement oxygen, available portable oxygen systems, continuous and intermittent flow rates, oxygen safety, concentrators, and Medicare reimbursement for oxygen. Explanation of Pulmonary Drugs, including class, frequency, complications, importance of spacers, rinsing mouth after steroid MDI's, and proper cleaning methods for nebulizers. Review of basic lung anatomy and physiology related to function, structure, and complications of lung disease. Review of risk factors. Discussion about methods for diagnosing sleep apnea and types of masks and machines for OSA. Includes a review of the use of types of environmental controls: home humidity, furnaces, filters, dust mite/pet prevention, HEPA vacuums. Discussion about weather changes, air quality and the benefits of nasal washing. Instruction on Warning signs, infection symptoms, calling MD promptly, preventive modes, and value of vaccinations. Review of effective airway clearance, coughing and/or vibration techniques. Emphasizing that all should Create an Action Plan. Written material given at graduation. Flowsheet Row  Pulmonary Rehab from 09/24/2023 in [facility] Card[facility] Pulmonary Rehab  Education need identified 09/24/23       AED/CPR: - Group verbal and written instruction with the use of models to demonstrate the basic use of the AED with the basic ABC's of resuscitation.    Anatomy and Cardiac Procedures: - Group verbal and visual presentation and models provide information about basic cardiac anatomy and function. Reviews the testing methods done to diagnose heart disease and the outcomes of the test results. Describes the treatment choices: Medical Management, Angioplasty, or Coronary Bypass Surgery for treating various heart conditions including Myocardial Infarction, Angina, Valve Disease, and Cardiac Arrhythmias.  Written material given at graduation.   Medication Safety: - Group verbal and visual instruction to review commonly prescribed medications for heart and lung disease. Reviews the medication, class of the drug, and side effects. Includes the steps to properly store meds and maintain the prescription regimen.  Written material given at graduation.   Other: -Provides group and verbal instruction on various topics (see comments)   Knowledge Questionnaire Score:  Knowledge Questionnaire Score - 09/24/23 1648       Knowledge Questionnaire Score   Pre Score 16/18              Core Components/Risk Factors/Patient Goals at Admission:  Personal Goals and Risk Factors at Admission - 09/24/23 1648       Core Components/Risk Factors/Patient Goals on Admission    Weight Management Yes;Weight Loss    Intervention Weight Management: Develop a combined nutrition and exercise program designed to reach desired caloric intake, while maintaining appropriate intake of nutrient and fiber, sodium and fats, and appropriate energy expenditure required for the weight goal.;Weight Management: Provide education and appropriate resources to help participant work on and attain dietary goals.;Weight  Management/Obesity: Establish reasonable  short term and long term weight goals.    Admit Weight 212 lb 14.4 oz (96.6 kg)    Goal Weight: Short Term 200 lb (90.7 kg)    Goal Weight: Long Term 150 lb (68 kg)    Expected Outcomes Long Term: Adherence to nutrition and physical activity/exercise program aimed toward attainment of established weight goal;Short Term: Continue to assess and modify interventions until short term weight is achieved;Weight Loss: Understanding of general recommendations for a balanced deficit meal plan, which promotes 1-2 lb weight loss per week and includes a negative energy balance of [phone] kcal/d;Understanding of distribution of calorie intake throughout the day with the consumption of 4-5 meals/snacks;Understanding recommendations for meals to include 15-35% energy as protein, 25-35% energy from fat, 35-60% energy from carbohydrates, less than 200mg  of dietary cholesterol, 20-35 gm of total fiber daily    Tobacco Cessation Yes    Number of packs per day 4    Intervention Assist the participant in steps to quit. Provide individualized education and counseling about committing to Tobacco Cessation, relapse prevention, and pharmacological support that can be provided by physician.;Education officer, environmental, assist with locating and accessing local/national Quit Smoking programs, and support quit date choice.    Expected Outcomes Short Term: Will demonstrate readiness to quit, by selecting a quit date.;Short Term: Will quit all tobacco product use, adhering to prevention of relapse plan.;Long Term: Complete abstinence from all tobacco products for at least 12 months from quit date.             Education:Diabetes - Individual verbal and written instruction to review signs/symptoms of diabetes, desired ranges of glucose level fasting, after meals and with exercise. Acknowledge that pre and post exercise glucose checks will be done for 3 sessions at entry of  program.   Know Your Numbers and Heart Failure: - Group verbal and visual instruction to discuss disease risk factors for cardiac and pulmonary disease and treatment options.  Reviews associated critical values for Overweight/Obesity, Hypertension, Cholesterol, and Diabetes.  Discusses basics of heart failure: signs/symptoms and treatments.  Introduces Heart Failure Zone chart for action plan for heart failure.  Written material given at graduation.   Core Components/Risk Factors/Patient Goals Review:   Goals and Risk Factor Review     Row Name 10/08/23 1728             Core Components/Risk Factors/Patient Goals Review   Personal Goals Review Improve shortness of breath with ADL's       Review Spoke to patient about their shortness of breath and what they can do to improve. Patient has been informed of breathing techniques when starting the program. Patient is informed to tell staff if they have had any med changes and that certain meds they are taking or not taking can be causing shortness of breath.       Expected Outcomes Short: Attend [facility] regularly to improve shortness of breath with ADL's. Long: maintain independence with ADL's                Core Components/Risk Factors/Patient Goals at Discharge (Final Review):   Goals and Risk Factor Review - 10/08/23 1728       Core Components/Risk Factors/Patient Goals Review   Personal Goals Review Improve shortness of breath with ADL's    Review Spoke to patient about their shortness of breath and what they can do to improve. Patient has been informed of breathing techniques when starting the program. Patient is informed to tell staff if  they have had any med changes and that certain meds they are taking or not taking can be causing shortness of breath.    Expected Outcomes Short: Attend [facility] regularly to improve shortness of breath with ADL's. Long: maintain independence with ADL's             ITP Comments:  ITP  Comments     Row Name 08/30/23 1451 09/24/23 1634 09/27/23 1710 10/17/23 0852     ITP Comments Initial phone call completed. Diagnosis can be found in [facility] 8/19. EP Orientation scheduled for Monday 9/23 at 2:30.       Desirai is a current tobacco user. Intervention for tobacco cessation was provided at the initial medical review. She was asked about readiness to quit and reported she is interested in more information. Patient was advised and educated about tobacco cessation using combination therapy, tobacco cessation classes, quit line, and quit smoking apps. Patient demonstrated understanding of this material. Staff will continue to provide encouragement and follow up with the patient throughout the program. Completed and gym orientation. Initial ITP created and sent for review to Dr. Jinny Sanders, Medical Director. Caleesi is a current tobacco user. Intervention for tobacco cessation was provided at the initial medical review. She was asked about readiness to quit. Patient was advised and educated about tobacco cessation using combination therapy, tobacco cessation classes, quit line, and quit smoking apps. Patient demonstrated understanding of this material. Staff will continue to provide encouragement and follow up with the patient throughout the program. First full day of exercise!  Patient was oriented to gym and equipment including functions, settings, policies, and procedures.  Patient's individual exercise prescription and treatment plan were reviewed.  All starting workloads were established based on the results of the 6 minute walk test done at initial orientation visit.  The plan for exercise progression was also introduced and progression will be customized based on patient's performance and goals. 30 Day review completed. Medical Director ITP review done, changes made as directed, and signed approval by Medical Director.    new to program             Comments:

## 2023-10-18 ENCOUNTER — Encounter: Payer: Medicaid Other | Admitting: *Deleted

## 2023-10-22 ENCOUNTER — Encounter: Payer: Medicaid Other | Attending: Pulmonary Disease

## 2023-10-22 DIAGNOSIS — J4551 Severe persistent asthma with (acute) exacerbation: Secondary | ICD-10-CM | POA: Insufficient documentation

## 2023-10-26 DIAGNOSIS — E7800 Pure hypercholesterolemia, unspecified: Secondary | ICD-10-CM | POA: Insufficient documentation

## 2023-10-30 ENCOUNTER — Telehealth: Payer: Self-pay

## 2023-10-30 NOTE — Telephone Encounter (Signed)
Received fax from [facility] that pt's test had been cancelled due to lack of insurance coverage. Called [facility] and was advised family hx information was missing and criteria cannot be met like that. Family history updated and new family hx given. Test form was faxed back, pt should now meet criteria.

## 2023-10-31 ENCOUNTER — Ambulatory Visit: Payer: Medicaid Other | Admitting: Gastroenterology

## 2023-11-01 ENCOUNTER — Ambulatory Visit: Payer: Medicaid Other | Attending: Pulmonary Disease

## 2023-11-01 DIAGNOSIS — F17210 Nicotine dependence, cigarettes, uncomplicated: Secondary | ICD-10-CM | POA: Insufficient documentation

## 2023-11-01 DIAGNOSIS — J4551 Severe persistent asthma with (acute) exacerbation: Secondary | ICD-10-CM | POA: Diagnosis present

## 2023-11-01 DIAGNOSIS — J4489 Other specified chronic obstructive pulmonary disease: Secondary | ICD-10-CM | POA: Insufficient documentation

## 2023-11-01 MED ORDER — ALBUTEROL SULFATE (2.5 MG/3ML) 0.083% IN NEBU
2.5000 mg | INHALATION_SOLUTION | Freq: Once | RESPIRATORY_TRACT | Status: AC
  Administered 2023-11-01: 2.5 mg via RESPIRATORY_TRACT
  Filled 2023-11-01: qty 3

## 2023-11-04 DIAGNOSIS — M51369 Other intervertebral disc degeneration, lumbar region without mention of lumbar back pain or lower extremity pain: Secondary | ICD-10-CM | POA: Insufficient documentation

## 2023-11-06 ENCOUNTER — Ambulatory Visit: Payer: Medicaid Other | Admitting: Pulmonary Disease

## 2023-11-06 ENCOUNTER — Encounter: Payer: Self-pay | Admitting: Pulmonary Disease

## 2023-11-06 ENCOUNTER — Other Ambulatory Visit
Admission: RE | Admit: 2023-11-06 | Discharge: 2023-11-06 | Disposition: A | Discharge status: Home / Self Care | Payer: Medicaid Other | Source: Ambulatory Visit | Attending: Pulmonary Disease | Admitting: Pulmonary Disease

## 2023-11-06 VITALS — BP 110/76 | HR 84 | Temp 97.6°F | Ht 64.5 in | Wt 230.8 lb

## 2023-11-06 DIAGNOSIS — J441 Chronic obstructive pulmonary disease with (acute) exacerbation: Secondary | ICD-10-CM | POA: Insufficient documentation

## 2023-11-06 DIAGNOSIS — F17210 Nicotine dependence, cigarettes, uncomplicated: Secondary | ICD-10-CM

## 2023-11-06 DIAGNOSIS — G4733 Obstructive sleep apnea (adult) (pediatric): Secondary | ICD-10-CM

## 2023-11-06 MED ORDER — TRELEGY ELLIPTA 200-62.5-25 MCG/ACT IN AEPB
1.0000 | INHALATION_SPRAY | Freq: Every day | RESPIRATORY_TRACT | 6 refills | Status: DC
Start: 2023-11-06 — End: 2023-11-22

## 2023-11-06 NOTE — Progress Notes (Addendum)
Synopsis: Referred in by Lorn Junes, FNP   Subjective:   PATIENT ID: Lindsay Harris: female DOB: 01-10-1976, MRN: [id number]  Chief Complaint  Patient presents with   Follow-up    Shortness of breath on exertion. Cough and wheezing.     HPI Lindsay Harris is a 47 y.o female patient with a past medical history of severe persistent asthma on triple therapy presenting to the pulmonary clinic today for follow up on her asthma/COPD overlap  I last saw earlier in August 2024 for asthma and she seemed to be in an asthma exacerbation. Referred to the ED and was managed with IV steroids and discharged on 5 days prednisone. She did better and presents today for a follow up visit.   She has had asthma for the past 32 years with multiple exacerbations one of them at age 36 requiring intubation. She has been feeling short of breath for the past month that has been woresening but hasn't seen anyone due to insurance issues. She has been on symbicort 80-4.5 2 puffs bid and spiriva for the past 6 months.   We increased her Symbicort during the last visit and currently is on spiriva as well.     FH: Strong family history of asthma in her mother and grandmother.   SH: Active smoker, smokes 1 ppd for 30 years, no alcohol use and no illicit drug use. Has 1 Dog at home.   ROS All systems were reviewed and are negative except for the above.  Objective:   Vitals:   11/06/23 1124  BP: 110/76  Pulse: 84  Temp: 97.6 F (36.4 C)  TempSrc: Temporal  SpO2: 99%  Weight: 230 lb 12.8 oz (104.7 kg)  Height: 5' 4.5" (1.638 m)   99% on RA BMI Readings from Last 3 Encounters:  11/06/23 39.00 kg/m  09/24/23 35.65 kg/m  09/04/23 37.02 kg/m   Wt Readings from Last 3 Encounters:  11/06/23 230 lb 12.8 oz (104.7 kg)  09/24/23 212 lb 14.4 oz (96.6 kg)  09/04/23 209 lb (94.8 kg)    Physical Exam GEN: Moderate respiratory distress with accessory muscle use.  HEENT: Supple Neck, Reactive Pupils,  EOMI  CVS: Normal S1, Normal S2, RRR, No murmurs or ES appreciated  Lungs: Inspiratory wheezing heard diffusely. No rales/ronchi.   Abdomen: Soft, non tender, non distended, + BS  Extremities: Warm and well perfused, No edema  Skin: No suspicious lesions appreciated  Psych: Good affect.   Ancillary Information   CBC    Component Value Date/Time   WBC 9.7 07/23/2023 1653   RBC 4.63 07/23/2023 1653   HGB 14.5 07/23/2023 1653   HCT 42.3 07/23/2023 1653   PLT 332 07/23/2023 1653   MCV 91.4 07/23/2023 1653   MCH 31.3 07/23/2023 1653   MCHC 34.3 07/23/2023 1653   RDW 13.4 07/23/2023 1653   LYMPHSABS 4.0 07/23/2023 1653   MONOABS 0.6 07/23/2023 1653   EOSABS 0.3 07/23/2023 1653   BASOSABS 0.1 07/23/2023 1653        No data to display         Labs and imaging were reviewed.   Assessment & Plan:  Lindsay Harris is a 47 y.o female patient with a past medical history of severe persistent asthma on triple therapy presenting to the pulmonary clinic today for further management of her asthma.   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  []  C/w pulmonary rehab  []  A1AT  []  Will possibly start biologics on next visit.  []  Will need Echocardiogram for evaluation of PH.   #Mild OSA (AHI 6.8 2024) Reports lound snoring and apneic episodes. Overweight and increased neck circumference with extreme fatigue during the day. Significant Daytime sleepiness and multiple naps during the day.    []  Start Auto CPAP 5-15 with 2L O2   #Tobacco use disorder  Discussed the importance of smoking cessation and dramatic impact on copd progression. She is ready to stop and has decided on Monday next week. Provided NRT.   Return in about 3 months (around 02/06/2024).  I spent  minutes caring for this patient today, including preparing to see the patient, obtaining a medical history , reviewing a separately obtained history, performing a medically appropriate examination and/or evaluation, documenting clinical information in the electronic health record, and independently interpreting results (not separately reported/billed) and communicating results to the patient/family/caregiver  Janann Colonel, MD [facility] 11/06/2023 1:46 PM

## 2023-11-07 ENCOUNTER — Encounter: Payer: Self-pay | Admitting: *Deleted

## 2023-11-07 DIAGNOSIS — J4551 Severe persistent asthma with (acute) exacerbation: Secondary | ICD-10-CM

## 2023-11-07 NOTE — Progress Notes (Signed)
Pulmonary Individual Treatment Plan  Patient Details  Name: Lindsay Harris MRN: [id number] Date of Birth: September 09, 1976 Referring Provider:   Flowsheet Row Pulmonary Rehab from 09/24/2023 in [facility] Cardiac and Pulmonary Rehab  Referring Provider Janann Colonel, MD       Initial Encounter Date:  Flowsheet Row Pulmonary Rehab from 09/24/2023 in [facility] Cardiac and Pulmonary Rehab  Date 09/24/23       Visit Diagnosis: Severe persistent asthma with acute exacerbation  Patient's Home Medications on Admission:  Current Outpatient Medications:    albuterol (PROVENTIL) (2.5 MG/3ML) 0.083% nebulizer solution, Take 2.5 mg by nebulization every 4 (four) hours as needed., Disp: , Rfl:    albuterol (VENTOLIN HFA) 108 (90 Base) MCG/ACT inhaler, Inhale 2 puffs into the lungs every 6 (six) hours as needed., Disp: , Rfl:    budesonide-formoterol (SYMBICORT) 160-4.5 MCG/ACT inhaler, Inhale 2 puffs into the lungs in the morning and at bedtime., Disp: 1 each, Rfl: 12   DULoxetine (CYMBALTA) 60 MG capsule, Take 60 mg by mouth daily., Disp: , Rfl:    Elagolix Sodium (ORILISSA) 200 MG TABS, Take 1 tablet (200 mg total) by mouth 2 (two) times daily., Disp: 60 tablet, Rfl: 6   EPINEPHrine 0.3 mg/0.3 mL IJ SOAJ injection, Inject 0.3 mg into the muscle as needed for anaphylaxis., Disp: , Rfl:    Fluticasone-Umeclidin-Vilant (TRELEGY ELLIPTA) 200-62.5-25 MCG/ACT AEPB, Inhale 1 puff into the lungs daily., Disp: 3 each, Rfl: 6   gabapentin (NEURONTIN) 100 MG capsule, Take 100 mg by mouth. 400 mg in the morning. 400 mg in the afternoon. 600 mg at bedtime., Disp: , Rfl:    linaclotide (LINZESS) 290 MCG CAPS capsule, TAKE 1 CAPSULE BY MOUTH DAILY BEFORE BREAKFAST., Disp: 90 capsule, Rfl: 0   methocarbamol (ROBAXIN) 500 MG tablet, Take 500 mg by mouth every 8 (eight) hours as needed., Disp: , Rfl:    montelukast (SINGULAIR) 10 MG tablet, Take 10 mg by mouth at bedtime., Disp: , Rfl:    nicotine (NICODERM CQ - DOSED IN  MG/24 HOURS) 14 mg/24hr patch, Place 1 patch (14 mg total) onto the skin daily., Disp: 30 patch, Rfl: 0   nicotine polacrilex (NICOTINE MINI) 2 MG lozenge, Take 1 lozenge (2 mg total) by mouth as needed for smoking cessation., Disp: 100 tablet, Rfl: 0   ondansetron (ZOFRAN) 4 mg TABS tablet, Take by mouth every 8 (eight) hours as needed., Disp: , Rfl:    pantoprazole (PROTONIX) 40 MG tablet, Take 40 mg by mouth daily., Disp: , Rfl:    propranolol ER (INDERAL LA) 80 MG 24 hr capsule, Take 80 mg by mouth daily., Disp: , Rfl:    rosuvastatin (CRESTOR) 20 MG tablet, Take 20 mg by mouth daily., Disp: , Rfl:    SPIRIVA HANDIHALER 18 MCG inhalation capsule, Place 18 mcg into inhaler and inhale daily., Disp: , Rfl:    SUMAtriptan (IMITREX) 25 MG tablet, Take 25 mg by mouth every 2 (two) hours as needed., Disp: , Rfl:    triamcinolone cream (KENALOG) 0.1 %, Apply topically 2 (two) times daily., Disp: , Rfl:   Past Medical History: Past Medical History:  Diagnosis Date   Asthma    Dyspnea    GERD (gastroesophageal reflux disease)    Headache    migraines    Tobacco Use: Social History   Tobacco Use  Smoking Status Every Day   Current packs/day: 1.00   Average packs/day: 1 pack/day for 37.9 years (37.9 ttl pk-yrs)   Types: Cigarettes  Start date: 22  Smokeless Tobacco Not on file  Tobacco Comments   Has smoked since age 69    Labs: Review Flowsheet       Latest Ref Rng & Units 09/19/2018 07/23/2023 09/04/2023  Labs for ITP Cardiac and Pulmonary Rehab  Cholestrol 100 - 199 mg/dL - - 147   LDL (calc) 0 - 99 mg/dL - - 829   HDL-C >56 mg/dL - - 50   Trlycerides 0 - 149 mg/dL - - 213   Hemoglobin Y8M 4.8 - 5.6 % - - 6.1   Bicarbonate 20.0 - 28.0 mmol/L - 23.1  -  TCO2 22 - 32 mmol/L 20  - -  O2 Saturation % - 81.5  -    Details             Pulmonary Assessment Scores:  Pulmonary Assessment Scores     Row Name 09/24/23 1642         ADL UCSD   ADL Phase Entry     SOB  Score total 117     Rest 5     Walk 5     Stairs 5     Bath 5     Dress 5     Shop 5       CAT Score   CAT Score 40       mMRC Score   mMRC Score 4              UCSD: Self-administered rating of dyspnea associated with activities of daily living (ADLs) 6-point scale (0 = "not at all" to 5 = "maximal or unable to do because of breathlessness")  Scoring Scores range from 0 to 120.  Minimally important difference is 5 units  CAT: CAT can identify the health impairment of COPD patients and is better correlated with disease progression.  CAT has a scoring range of zero to 40. The CAT score is classified into four groups of low (less than 10), medium (10 - 20), high (21-30) and very high (31-40) based on the impact level of disease on health status. A CAT score over 10 suggests significant symptoms.  A worsening CAT score could be explained by an exacerbation, poor medication adherence, poor inhaler technique, or progression of COPD or comorbid conditions.  CAT MCID is 2 points  mMRC: mMRC (Modified Medical Research Council) Dyspnea Scale is used to assess the degree of baseline functional disability in patients of respiratory disease due to dyspnea. No minimal important difference is established. A decrease in score of 1 point or greater is considered a positive change.   Pulmonary Function Assessment:   Exercise Target Goals: Exercise Program Goal: Individual exercise prescription set using results from initial 6 min walk test and THRR while considering  patient's activity barriers and safety.   Exercise Prescription Goal: Initial exercise prescription builds to 30-45 minutes a day of aerobic activity, 2-3 days per week.  Home exercise guidelines will be given to patient during program as part of exercise prescription that the participant will acknowledge.  Education: Aerobic Exercise: - Group verbal and visual presentation on the components of exercise prescription.  Introduces F.I.T.T principle from ACSM for exercise prescriptions.  Reviews F.I.T.T. principles of aerobic exercise including progression. Written material given at graduation.   Education: Resistance Exercise: - Group verbal and visual presentation on the components of exercise prescription. Introduces F.I.T.T principle from ACSM for exercise prescriptions  Reviews F.I.T.T. principles of resistance exercise including progression. Written material given at  graduation.    Education: Exercise & Equipment Safety: - Individual verbal instruction and demonstration of equipment use and safety with use of the equipment. Flowsheet Row Pulmonary Rehab from 09/24/2023 in [facility] Cardiac and Pulmonary Rehab  Date 09/24/23  Educator MB  Instruction Review Code 1- Verbalizes Understanding       Education: Exercise Physiology & General Exercise Guidelines: - Group verbal and written instruction with models to review the exercise physiology of the cardiovascular system and associated critical values. Provides general exercise guidelines with specific guidelines to those with heart or lung disease.    Education: Flexibility, Balance, Mind/Body Relaxation: - Group verbal and visual presentation with interactive activity on the components of exercise prescription. Introduces F.I.T.T principle from ACSM for exercise prescriptions. Reviews F.I.T.T. principles of flexibility and balance exercise training including progression. Also discusses the mind body connection.  Reviews various relaxation techniques to help reduce and manage stress (i.e. Deep breathing, progressive muscle relaxation, and visualization). Balance handout provided to take home. Written material given at graduation.   Activity Barriers & Risk Stratification:  Activity Barriers & Cardiac Risk Stratification - 09/24/23 1637       Activity Barriers & Cardiac Risk Stratification   Activity Barriers Back Problems;Balance Concerns              6 Minute Walk:  6 Minute Walk     Row Name 09/24/23 1634         6 Minute Walk   Phase Initial     Distance 600 feet     Walk Time 6 minutes     # of Rest Breaks 0     MPH 1.14     METS 2.3     RPE 19     Perceived Dyspnea  4     VO2 Peak 8.04     Symptoms Yes (comment)     Comments SOB, coughing, and back pain     Resting HR 78 bpm     Resting BP 116/76     Resting Oxygen Saturation  88 %     Exercise Oxygen Saturation  during 6 min walk 84 %     Max Ex. HR 83 bpm     Max Ex. BP 124/72     2 Minute Post BP 102/70       Interval HR   1 Minute HR 83     2 Minute HR 81     3 Minute HR 77     4 Minute HR 76     5 Minute HR 79     6 Minute HR 78     2 Minute Post HR 74     Interval Heart Rate? Yes       Interval Oxygen   Interval Oxygen? Yes     Baseline Oxygen Saturation % 88 %     1 Minute Oxygen Saturation % 85 %     1 Minute Liters of Oxygen 2 L     2 Minute Oxygen Saturation % 85 %     2 Minute Liters of Oxygen 2 L     3 Minute Oxygen Saturation % 84 %     3 Minute Liters of Oxygen 2 L     4 Minute Oxygen Saturation % 84 %     4 Minute Liters of Oxygen 2 L     5 Minute Oxygen Saturation % 84 %     5 Minute Liters of Oxygen 2 L  6 Minute Oxygen Saturation % 86 %     6 Minute Liters of Oxygen 2 L     2 Minute Post Oxygen Saturation % 86 %     2 Minute Post Liters of Oxygen 2 L             Oxygen Initial Assessment:  Oxygen Initial Assessment - 08/30/23 1500       Home Oxygen   Home Oxygen Device None    Sleep Oxygen Prescription None    Home Exercise Oxygen Prescription None    Home Resting Oxygen Prescription None    Compliance with Home Oxygen Use Yes      Intervention   Short Term Goals To learn and understand importance of monitoring SPO2 with pulse oximeter and demonstrate accurate use of the pulse oximeter.;To learn and demonstrate proper pursed lip breathing techniques or other breathing techniques. ;To learn and understand  importance of maintaining oxygen saturations>88%;To learn and demonstrate proper use of respiratory medications    Long  Term Goals Verbalizes importance of monitoring SPO2 with pulse oximeter and return demonstration;Maintenance of O2 saturations>88%;Exhibits proper breathing techniques, such as pursed lip breathing or other method taught during program session;Compliance with respiratory medication;Demonstrates proper use of MDI's             Oxygen Re-Evaluation:  Oxygen Re-Evaluation     Row Name 09/27/23 1711 10/08/23 1726           Program Oxygen Prescription   Program Oxygen Prescription -- Continuous;E-Tanks      Liters per minute -- 2        Home Oxygen   Home Oxygen Device -- Home Concentrator;E-Tanks      Sleep Oxygen Prescription -- Continuous  getting a CPAP      Liters per minute -- 2      Home Exercise Oxygen Prescription -- Continuous      Liters per minute -- 2      Home Resting Oxygen Prescription -- Continuous      Liters per minute -- 2      Compliance with Home Oxygen Use -- Yes        Goals/Expected Outcomes   Short Term Goals -- To learn and demonstrate proper pursed lip breathing techniques or other breathing techniques.       Long  Term Goals -- Exhibits proper breathing techniques, such as pursed lip breathing or other method taught during program session      Comments Reviewed PLB technique with pt.  Talked about how it works and it's importance in maintaining their exercise saturations. Informed patient how to perform the Pursed Lipped breathing technique. Told patient to Inhale through the nose and out the mouth with pursed lips to keep their airways open, help oxygenate them better, practice when at rest or doing strenuous activity. Patient Verbalizes understanding of technique and will work on and be reiterated during [facility].      Goals/Expected Outcomes Short: Become more profiecient at using PLB.   Long: Become independent at using PLB. Short:  use PLB with exertion. Long: use PLB on exertion proficiently and independently.               Oxygen Discharge (Final Oxygen Re-Evaluation):  Oxygen Re-Evaluation - 10/08/23 1726       Program Oxygen Prescription   Program Oxygen Prescription Continuous;E-Tanks    Liters per minute 2      Home Oxygen   Home Oxygen Device Home Concentrator;E-Tanks  Sleep Oxygen Prescription Continuous   getting a CPAP   Liters per minute 2    Home Exercise Oxygen Prescription Continuous    Liters per minute 2    Home Resting Oxygen Prescription Continuous    Liters per minute 2    Compliance with Home Oxygen Use Yes      Goals/Expected Outcomes   Short Term Goals To learn and demonstrate proper pursed lip breathing techniques or other breathing techniques.     Long  Term Goals Exhibits proper breathing techniques, such as pursed lip breathing or other method taught during program session    Comments Informed patient how to perform the Pursed Lipped breathing technique. Told patient to Inhale through the nose and out the mouth with pursed lips to keep their airways open, help oxygenate them better, practice when at rest or doing strenuous activity. Patient Verbalizes understanding of technique and will work on and be reiterated during [facility].    Goals/Expected Outcomes Short: use PLB with exertion. Long: use PLB on exertion proficiently and independently.             Initial Exercise Prescription:  Initial Exercise Prescription - 09/24/23 1600       Date of Initial Exercise RX and Referring Provider   Date 09/24/23    Referring Provider Janann Colonel, MD      Oxygen   Maintain Oxygen Saturation 88% or higher      Treadmill   MPH 1.2    Grade 0    Minutes 15    METs 1.92      Recumbant Bike   Level 1    RPM 50    Watts 9    Minutes 15    METs 2.3      NuStep   Level 1    SPM 80    Minutes 15    METs 2.3      Biostep-RELP   Level 1    SPM 50    Minutes 15     METs 2.3      Prescription Details   Frequency (times per week) 2    Duration Progress to 30 minutes of continuous aerobic without signs/symptoms of physical distress      Intensity   THRR 40-80% of Max Heartrate 116-154    Ratings of Perceived Exertion 11-13    Perceived Dyspnea 0-4      Progression   Progression Continue to progress workloads to maintain intensity without signs/symptoms of physical distress.      Resistance Training   Training Prescription Yes    Weight 0 lb    Reps 10-15             Perform Capillary Blood Glucose checks as needed.  Exercise Prescription Changes:   Exercise Prescription Changes     Row Name 09/24/23 1600 10/05/23 0900 10/17/23 1500         Response to Exercise   Blood Pressure (Admit) 116/76 102/58 110/64     Blood Pressure (Exercise) 124/72 110/62 130/62     Blood Pressure (Exit) 102/70 106/58 118/64     Heart Rate (Admit) 78 bpm 68 bpm 72 bpm     Heart Rate (Exercise) 83 bpm 83 bpm 99 bpm     Heart Rate (Exit) 73 bpm 83 bpm 75 bpm     Oxygen Saturation (Admit) 88 % 95 % 95 %     Oxygen Saturation (Exercise) 84 % 94 % 95 %     Oxygen  Saturation (Exit) 85 % 94 % 93 %     Rating of Perceived Exertion (Exercise) 19 13 17      Perceived Dyspnea (Exercise) 4 1 4      Symptoms SOB, coughing, and back pain SOB SOB     Comments results First full day of exercise --     Duration Progress to 30 minutes of  aerobic without signs/symptoms of physical distress Progress to 30 minutes of  aerobic without signs/symptoms of physical distress Progress to 30 minutes of  aerobic without signs/symptoms of physical distress     Intensity THRR New THRR unchanged THRR unchanged       Progression   Progression Continue to progress workloads to maintain intensity without signs/symptoms of physical distress. Continue to progress workloads to maintain intensity without signs/symptoms of physical distress. Continue to progress workloads to maintain  intensity without signs/symptoms of physical distress.     Average METs 2.3 2.15 4.58       Resistance Training   Training Prescription -- Yes Yes     Weight -- 0 lb 0 lb     Reps -- 10-15 10-15       Interval Training   Interval Training -- No No       Oxygen   Oxygen -- -- Continuous     Liters -- -- 2       Treadmill   MPH -- -- 1.5     Grade -- -- 0     Minutes -- -- 15     METs -- -- 2.15       Recumbant Bike   Level -- 1 --     Watts -- 14 --     Minutes -- 15 --     METs -- 2.29 --       NuStep   Level -- 1 --     Minutes -- 15 --     METs -- 2 --       Biostep-RELP   Level -- -- 1     Minutes -- -- 15     METs -- -- 1       Oxygen   Maintain Oxygen Saturation -- 88% or higher 88% or higher              Exercise Comments:   Exercise Comments     Row Name 09/27/23 1710           Exercise Comments First full day of exercise!  Patient was oriented to gym and equipment including functions, settings, policies, and procedures.  Patient's individual exercise prescription and treatment plan were reviewed.  All starting workloads were established based on the results of the 6 minute walk test done at initial orientation visit.  The plan for exercise progression was also introduced and progression will be customized based on patient's performance and goals.                Exercise Goals and Review:   Exercise Goals     Row Name 09/24/23 1640             Exercise Goals   Increase Physical Activity Yes       Intervention Provide advice, education, support and counseling about physical activity/exercise needs.;Develop an individualized exercise prescription for aerobic and resistive training based on initial evaluation findings, risk stratification, comorbidities and participant's personal goals.       Expected Outcomes Short Term: Attend rehab on a regular basis to increase  amount of physical activity.;Long Term: Exercising regularly at least 3-5  days a week.;Long Term: Add in home exercise to make exercise part of routine and to increase amount of physical activity.       Increase Strength and Stamina Yes       Intervention Provide advice, education, support and counseling about physical activity/exercise needs.;Develop an individualized exercise prescription for aerobic and resistive training based on initial evaluation findings, risk stratification, comorbidities and participant's personal goals.       Expected Outcomes Short Term: Increase workloads from initial exercise prescription for resistance, speed, and METs.;Short Term: Perform resistance training exercises routinely during rehab and add in resistance training at home;Long Term: Improve cardiorespiratory fitness, muscular endurance and strength as measured by increased METs and functional capacity ( )       Able to understand and use rate of perceived exertion (RPE) scale Yes       Intervention Provide education and explanation on how to use RPE scale       Expected Outcomes Short Term: Able to use RPE daily in rehab to express subjective intensity level;Long Term:  Able to use RPE to guide intensity level when exercising independently       Able to understand and use Dyspnea scale Yes       Intervention Provide education and explanation on how to use Dyspnea scale       Expected Outcomes Short Term: Able to use Dyspnea scale daily in rehab to express subjective sense of shortness of breath during exertion;Long Term: Able to use Dyspnea scale to guide intensity level when exercising independently       Knowledge and understanding of Target Heart Rate Range (THRR) Yes       Intervention Provide education and explanation of THRR including how the numbers were predicted and where they are located for reference       Expected Outcomes Short Term: Able to state/look up THRR;Long Term: Able to use THRR to govern intensity when exercising independently;Short Term: Able to use daily as  guideline for intensity in rehab       Able to check pulse independently Yes       Intervention Provide education and demonstration on how to check pulse in carotid and radial arteries.;Review the importance of being able to check your own pulse for safety during independent exercise       Expected Outcomes Short Term: Able to explain why pulse checking is important during independent exercise;Long Term: Able to check pulse independently and accurately       Understanding of Exercise Prescription Yes       Intervention Provide education, explanation, and written materials on patient's individual exercise prescription       Expected Outcomes Short Term: Able to explain program exercise prescription;Long Term: Able to explain home exercise prescription to exercise independently                Exercise Goals Re-Evaluation :  Exercise Goals Re-Evaluation     Row Name 09/27/23 1710 10/05/23 0931 10/17/23 1543         Exercise Goal Re-Evaluation   Exercise Goals Review Able to understand and use rate of perceived exertion (RPE) scale;Increase Physical Activity;Knowledge and understanding of Target Heart Rate Range (THRR);Understanding of Exercise Prescription;Increase Strength and Stamina;Able to understand and use Dyspnea scale;Able to check pulse independently Increase Physical Activity;Increase Strength and Stamina;Understanding of Exercise Prescription Increase Physical Activity;Increase Strength and Stamina;Understanding of Exercise Prescription     Comments Reviewed  RPE and dyspnea scale, THR and program prescription with pt today.  Pt voiced understanding and was given a copy of goals to take home. Lindsay Harris is off to a good start in the program. She was able to work at level 1 on the recumbent bike and T4 nustep during her first rehab session. She did not do any walking during her first session. She also did range of motion for resistance training exercises. We will continue to monitor her  progress in the program. Lindsay Harris is doing well in rehab although she has only attended one session since the last review. During this session she began using the treadmill at a speed of 1.5 mph with no incline. She also used the biostep at level 1. We will remind her of the importance of good attendance in order to see results and continue to monitor her progress in the program.     Expected Outcomes Short: Use RPE daily to regulate intensity.  Long: Follow program prescription in THR. Short: Continue to follow initial exercise prescription. Long: Continue exercise to improve strength and stamina. Short: Attend rehab more regularly. Long: Continue exercise to improve strength and stamina.              Discharge Exercise Prescription (Final Exercise Prescription Changes):  Exercise Prescription Changes - 10/17/23 1500       Response to Exercise   Blood Pressure (Admit) 110/64    Blood Pressure (Exercise) 130/62    Blood Pressure (Exit) 118/64    Heart Rate (Admit) 72 bpm    Heart Rate (Exercise) 99 bpm    Heart Rate (Exit) 75 bpm    Oxygen Saturation (Admit) 95 %    Oxygen Saturation (Exercise) 95 %    Oxygen Saturation (Exit) 93 %    Rating of Perceived Exertion (Exercise) 17    Perceived Dyspnea (Exercise) 4    Symptoms SOB    Duration Progress to 30 minutes of  aerobic without signs/symptoms of physical distress    Intensity THRR unchanged      Progression   Progression Continue to progress workloads to maintain intensity without signs/symptoms of physical distress.    Average METs 4.58      Resistance Training   Training Prescription Yes    Weight 0 lb    Reps 10-15      Interval Training   Interval Training No      Oxygen   Oxygen Continuous    Liters 2      Treadmill   MPH 1.5    Grade 0    Minutes 15    METs 2.15      Biostep-RELP   Level 1    Minutes 15    METs 1      Oxygen   Maintain Oxygen Saturation 88% or higher             Nutrition:   Target Goals: Understanding of nutrition guidelines, daily intake of sodium 1500mg , cholesterol 200mg , calories 30% from fat and 7% or less from saturated fats, daily to have 5 or more servings of fruits and vegetables.  Education: All About Nutrition: -Group instruction provided by verbal, written material, interactive activities, discussions, models, and posters to present general guidelines for heart healthy nutrition including fat, fiber, MyPlate, the role of sodium in heart healthy nutrition, utilization of the nutrition label, and utilization of this knowledge for meal planning. Follow up email sent as well. Written material given at graduation.   Biometrics:  Pre  Biometrics - 09/24/23 1640       Pre Biometrics   Height 5' 4.8" (1.646 m)    Weight 212 lb 14.4 oz (96.6 kg)    Waist Circumference 42 inches    Hip Circumference 46 inches    Waist to Hip Ratio 0.91 %    BMI (Calculated) 35.64    Single Leg Stand 30 seconds              Nutrition Therapy Plan and Nutrition Goals:  Nutrition Therapy & Goals - 09/24/23 1645       Personal Nutrition Goals   Nutrition Goal Meet with RD on 10/14      Intervention Plan   Intervention Prescribe, educate and counsel regarding individualized specific dietary modifications aiming towards targeted core components such as weight, hypertension, lipid management, diabetes, heart failure and other comorbidities.;Nutrition handout(s) given to patient.    Expected Outcomes Short Term Goal: Understand basic principles of dietary content, such as calories, fat, sodium, cholesterol and nutrients.;Long Term Goal: Adherence to prescribed nutrition plan.;Short Term Goal: A plan has been developed with personal nutrition goals set during dietitian appointment.             Nutrition Assessments:  MEDIFICTS Score Key: >=70 Need to make dietary changes  40-70 Heart Healthy Diet <= 40 Therapeutic Level Cholesterol Diet  Flowsheet Row  Pulmonary Rehab from 09/24/2023 in [facility]  Picture Your Plate Total Score on Admission 51      Picture Your Plate Scores: <57 Unhealthy dietary pattern with much room for improvement. 41-50 Dietary pattern unlikely to meet recommendations for good health and room for improvement. 51-60 More healthful dietary pattern, with some room for improvement.  >60 Healthy dietary pattern, although there may be some specific behaviors that could be improved.   Nutrition Goals Re-Evaluation:  Nutrition Goals Re-Evaluation     Row Name 10/08/23 1725             Goals   Comment Patient was informed on why it is important to maintain a balanced diet when dealing with Respiratory issues. Explained that it takes a lot of energy to breath and when they are short of breath often they will need to have a good diet to help keep up with the calories they are expending for breathing.       Expected Outcome Short: Choose and plan snacks accordingly to patients caloric intake to improve breathing. Long: Maintain a diet independently that meets their caloric intake to aid in daily shortness of breath.                Nutrition Goals Discharge (Final Nutrition Goals Re-Evaluation):  Nutrition Goals Re-Evaluation - 10/08/23 1725       Goals   Comment Patient was informed on why it is important to maintain a balanced diet when dealing with Respiratory issues. Explained that it takes a lot of energy to breath and when they are short of breath often they will need to have a good diet to help keep up with the calories they are expending for breathing.    Expected Outcome Short: Choose and plan snacks accordingly to patients caloric intake to improve breathing. Long: Maintain a diet independently that meets their caloric intake to aid in daily shortness of breath.             Psychosocial: Target Goals: Acknowledge presence or absence of significant depression and/or stress,  maximize coping skills, provide positive support system.  Participant is able to verbalize types and ability to use techniques and skills needed for reducing stress and depression.   Education: Stress, Anxiety, and Depression - Group verbal and visual presentation to define topics covered.  Reviews how body is impacted by stress, anxiety, and depression.  Also discusses healthy ways to reduce stress and to treat/manage anxiety and depression.  Written material given at graduation.   Education: Sleep Hygiene -Provides group verbal and written instruction about how sleep can affect your health.  Define sleep hygiene, discuss sleep cycles and impact of sleep habits. Review good sleep hygiene tips.    Initial Review & Psychosocial Screening:  Initial Psych Review & Screening - 08/30/23 1441       Initial Review   Current issues with History of Depression;Current Anxiety/Panic;Current Stress Concerns;Current Sleep Concerns    Source of Stress Concerns Chronic Illness    Comments colon cancer/cervical cancer possibility; new sleep apnea      Family Dynamics   Good Support System? Yes   family     Barriers   Psychosocial barriers to participate in program There are no identifiable barriers or psychosocial needs.;The patient should benefit from training in stress management and relaxation.      Screening Interventions   Interventions Encouraged to exercise;To provide support and resources with identified psychosocial needs    Expected Outcomes Short Term goal: Utilizing psychosocial counselor, staff and physician to assist with identification of specific Stressors or current issues interfering with healing process. Setting desired goal for each stressor or current issue identified.;Long Term Goal: Stressors or current issues are controlled or eliminated.;Short Term goal: Identification and review with participant of any Quality of Life or Depression concerns found by scoring the questionnaire.;Long  Term goal: The participant improves quality of Life and PHQ9 Scores as seen by post scores and/or verbalization of changes             Quality of Life Scores:  Scores of 19 and below usually indicate a poorer quality of life in these areas.  A difference of  2-3 points is a clinically meaningful difference.  A difference of 2-3 points in the total score of the Quality of Life Index has been associated with significant improvement in overall quality of life, self-image, physical symptoms, and general health in studies assessing change in quality of life.  PHQ-9: Review Flowsheet       09/24/2023 06/29/2023  Depression screen PHQ 2/9  Decreased Interest 0 3  Down, Depressed, Hopeless 0 1  PHQ - 2 Score 0 4  Altered sleeping 0 3  Tired, decreased energy 2 3  Change in appetite 0 3  Feeling bad or failure about yourself  0 3  Trouble concentrating 0 3  Moving slowly or fidgety/restless 0 3  Suicidal thoughts 0 0  PHQ-9 Score 2 22  Difficult doing work/chores Extremely dIfficult Extremely dIfficult    Details           Interpretation of Total Score  Total Score Depression Severity:  1-4 = Minimal depression, 5-9 = Mild depression, 10-14 = Moderate depression, 15-19 = Moderately severe depression, 20-27 = Severe depression   Psychosocial Evaluation and Intervention:  Psychosocial Evaluation - 08/30/23 1451       Psychosocial Evaluation & Interventions   Interventions Encouraged to exercise with the program and follow exercise prescription;Stress management education;Relaxation education    Comments Lindsay Harris is coming to pulmonary rehab with chronic asthma. She has been recently diagnosed with sleep apnea, colon biopsies  and cervical markers concerning for cancer. She also has 7 herniated discs which she is waiting for the inflammation to improve before they can do any type of intervention. Her husband and daughters are very supportive. She is motivated to stop smoking and has  cut back to 1 pack a day. She is working with her doctor to help manage her depression and anxiety. Her current dose of zoloft does not feel to be working, so she is seeing her doctor tomorrow. Recently she has gained weight with unknown reason and she wants to get back to her normal weight. She is motivated to come to the program to work on bettering her health and improving her stamina    Expected Outcomes Short: attend pulmonary rehab for education and exercise. Long: develop and maintain positive self care habits    Continue Psychosocial Services  Follow up required by staff             Psychosocial Re-Evaluation:   Psychosocial Discharge (Final Psychosocial Re-Evaluation):   Education: Education Goals: Education classes will be provided on a weekly basis, covering required topics. Participant will state understanding/return demonstration of topics presented.  Learning Barriers/Preferences:  Learning Barriers/Preferences - 08/30/23 1441       Learning Barriers/Preferences   Learning Barriers None    Learning Preferences None             General Pulmonary Education Topics:  Infection Prevention: - Provides verbal and written material to individual with discussion of infection control including proper hand washing and proper equipment cleaning during exercise session. Flowsheet Row Pulmonary Rehab from 09/24/2023 in [facility] Card[facility] and Pulmonary Rehab  Date 09/24/23  Educator MB  Instruction Review Code 1- Verbalizes Understanding       Falls Prevention: - Provides verbal and written material to individual with discussion of falls prevention and safety. Flowsheet Row Pulmonary Rehab from 09/24/2023 in [facility] Rehab  Date 09/24/23  Educator MB  Instruction Review Code 1- Verbalizes Understanding       Chronic Lung Disease Review: - Group verbal instruction with posters, models, PowerPoint presentations and videos,  to review new updates, new  respiratory medications, new advancements in procedures and treatments. Providing information on websites and "800" numbers for continued self-education. Includes information about supplement oxygen, available portable oxygen systems, continuous and intermittent flow rates, oxygen safety, concentrators, and Medicare reimbursement for oxygen. Explanation of Pulmonary Drugs, including class, frequency, complications, importance of spacers, rinsing mouth after steroid MDI's, and proper cleaning methods for nebulizers. Review of basic lung anatomy and physiology related to function, structure, and complications of lung disease. Review of risk factors. Discussion about methods for diagnosing sleep apnea and types of masks and machines for OSA. Includes a review of the use of types of environmental controls: home humidity, furnaces, filters, dust mite/pet prevention, HEPA vacuums. Discussion about weather changes, air quality and the benefits of nasal washing. Instruction on Warning signs, infection symptoms, calling MD promptly, preventive modes, and value of vaccinations. Review of effective airway clearance, coughing and/or vibration techniques. Emphasizing that all should Create an Action Plan. Written material given at graduation. Flowsheet Row Pulmonary Rehab from 09/24/2023 in [facility] Pulmonary Rehab  Education need identified 09/24/23       AED/CPR: - Group verbal and written instruction with the use of models to demonstrate the basic use of the AED with the basic ABC's of resuscitation.    Anatomy and Cardiac Procedures: - Group verbal and visual presentation and models  provide information about basic cardiac anatomy and function. Reviews the testing methods done to diagnose heart disease and the outcomes of the test results. Describes the treatment choices: Medical Management, Angioplasty, or Coronary Bypass Surgery for treating various heart conditions including Myocardial Infarction,  Angina, Valve Disease, and Cardiac Arrhythmias.  Written material given at graduation.   Medication Safety: - Group verbal and visual instruction to review commonly prescribed medications for heart and lung disease. Reviews the medication, class of the drug, and side effects. Includes the steps to properly store meds and maintain the prescription regimen.  Written material given at graduation.   Other: -Provides group and verbal instruction on various topics (see comments)   Knowledge Questionnaire Score:  Knowledge Questionnaire Score - 09/24/23 1648       Knowledge Questionnaire Score   Pre Score 16/18              Core Components/Risk Factors/Patient Goals at Admission:  Personal Goals and Risk Factors at Admission - 09/24/23 1648       Core Components/Risk Factors/Patient Goals on Admission    Weight Management Yes;Weight Loss    Intervention Weight Management: Develop a combined nutrition and exercise program designed to reach desired caloric intake, while maintaining appropriate intake of nutrient and fiber, sodium and fats, and appropriate energy expenditure required for the weight goal.;Weight Management: Provide education and appropriate resources to help participant work on and attain dietary goals.;Weight Management/Obesity: Establish reasonable short term and long term weight goals.    Admit Weight 212 lb 14.4 oz (96.6 kg)    Goal Weight: Short Term 200 lb (90.7 kg)    Goal Weight: Long Term 150 lb (68 kg)    Expected Outcomes Long Term: Adherence to nutrition and physical activity/exercise program aimed toward attainment of established weight goal;Short Term: Continue to assess and modify interventions until short term weight is achieved;Weight Loss: Understanding of general recommendations for a balanced deficit meal plan, which promotes 1-2 lb weight loss per week and includes a negative energy balance of [phone] kcal/d;Understanding of distribution of calorie intake  throughout the day with the consumption of 4-5 meals/snacks;Understanding recommendations for meals to include 15-35% energy as protein, 25-35% energy from fat, 35-60% energy from carbohydrates, less than 200mg  of dietary cholesterol, 20-35 gm of total fiber daily    Tobacco Cessation Yes    Number of packs per day 4    Intervention Assist the participant in steps to quit. Provide individualized education and counseling about committing to Tobacco Cessation, relapse prevention, and pharmacological support that can be provided by physician.;Education officer, environmental, assist with locating and accessing local/national Quit Smoking programs, and support quit date choice.    Expected Outcomes Short Term: Will demonstrate readiness to quit, by selecting a quit date.;Short Term: Will quit all tobacco product use, adhering to prevention of relapse plan.;Long Term: Complete abstinence from all tobacco products for at least 12 months from quit date.             Education:Diabetes - Individual verbal and written instruction to review signs/symptoms of diabetes, desired ranges of glucose level fasting, after meals and with exercise. Acknowledge that pre and post exercise glucose checks will be done for 3 sessions at entry of program.   Know Your Numbers and Heart Failure: - Group verbal and visual instruction to discuss disease risk factors for cardiac and pulmonary disease and treatment options.  Reviews associated critical values for Overweight/Obesity, Hypertension, Cholesterol, and Diabetes.  Discusses basics of heart failure:  signs/symptoms and treatments.  Introduces Heart Failure Zone chart for action plan for heart failure.  Written material given at graduation.   Core Components/Risk Factors/Patient Goals Review:   Goals and Risk Factor Review     Row Name 10/08/23 1728             Core Components/Risk Factors/Patient Goals Review   Personal Goals Review Improve shortness of breath  with ADL's       Review Spoke to patient about their shortness of breath and what they can do to improve. Patient has been informed of breathing techniques when starting the program. Patient is informed to tell staff if they have had any med changes and that certain meds they are taking or not taking can be causing shortness of breath.       Expected Outcomes Short: Attend [facility] regularly to improve shortness of breath with ADL's. Long: maintain independence with ADL's                Core Components/Risk Factors/Patient Goals at Discharge (Final Review):   Goals and Risk Factor Review - 10/08/23 1728       Core Components/Risk Factors/Patient Goals Review   Personal Goals Review Improve shortness of breath with ADL's    Review Spoke to patient about their shortness of breath and what they can do to improve. Patient has been informed of breathing techniques when starting the program. Patient is informed to tell staff if they have had any med changes and that certain meds they are taking or not taking can be causing shortness of breath.    Expected Outcomes Short: Attend [facility] regularly to improve shortness of breath with ADL's. Long: maintain independence with ADL's             ITP Comments:  ITP Comments     Row Name 08/30/23 1451 09/24/23 1634 09/27/23 1710 10/17/23 0852 11/07/23 0931   ITP Comments Initial phone call completed. Diagnosis can be found in [facility] 8/19. EP Orientation scheduled for Monday 9/23 at 2:30.       Lindsay Harris is a current tobacco user. Intervention for tobacco cessation was provided at the initial medical review. She was asked about readiness to quit and reported she is interested in more information. Patient was advised and educated about tobacco cessation using combination therapy, tobacco cessation classes, quit line, and quit smoking apps. Patient demonstrated understanding of this material. Staff will continue to provide encouragement and follow up with  the patient throughout the program. Completed and gym orientation. Initial ITP created and sent for review to Dr. Jinny Sanders, Medical Director. Lindsay Harris is a current tobacco user. Intervention for tobacco cessation was provided at the initial medical review. She was asked about readiness to quit. Patient was advised and educated about tobacco cessation using combination therapy, tobacco cessation classes, quit line, and quit smoking apps. Patient demonstrated understanding of this material. Staff will continue to provide encouragement and follow up with the patient throughout the program. First full day of exercise!  Patient was oriented to gym and equipment including functions, settings, policies, and procedures.  Patient's individual exercise prescription and treatment plan were reviewed.  All starting workloads were established based on the results of the 6 minute walk test done at initial orientation visit.  The plan for exercise progression was also introduced and progression will be customized based on patient's performance and goals. 30 Day review completed. Medical Director ITP review done, changes made as directed, and signed approval by Medical  Director.    new to program 30 Day review completed. Medical Director ITP review done, changes made as directed, and signed approval by Medical Director.            Comments:

## 2023-11-08 ENCOUNTER — Telehealth: Payer: Self-pay | Admitting: Pulmonary Disease

## 2023-11-08 ENCOUNTER — Telehealth: Payer: Self-pay

## 2023-11-08 ENCOUNTER — Other Ambulatory Visit (HOSPITAL_COMMUNITY): Payer: Self-pay

## 2023-11-08 NOTE — Telephone Encounter (Signed)
PA request has been Submitted. New Encounter created for follow up. For additional info see Pharmacy Prior Auth telephone encounter from 11/21.

## 2023-11-08 NOTE — Telephone Encounter (Signed)
Message sent to [facility] to check on order

## 2023-11-08 NOTE — Telephone Encounter (Signed)
Noted  

## 2023-11-08 NOTE — Telephone Encounter (Signed)
*  Pulm  Pharmacy Patient Advocate Encounter   Received notification from RX Request Messages that prior authorization for Trelegy Ellipta 200-62.5-25MCG/ACT aerosol powder  is required/requested.   Insurance verification completed.   The patient is insured through [facility] .   Per test claim: PA required; PA started via CoverMyMeds. KEY [id number] . Waiting for clinical questions to populate.

## 2023-11-08 NOTE — Telephone Encounter (Signed)
Medicaid will not cover her cpap and medication. Patient states that medicaid needs prior auth for inhaler and one of the ordering numbers were off for the cpap machine. Please contact patient and advise on her next steps. [phone]

## 2023-11-08 NOTE — Telephone Encounter (Signed)
[facility] please find out about the CPAP from [facility].  Prior Auth team please see about the Trelegy needed a prior auth.

## 2023-11-09 ENCOUNTER — Telehealth: Payer: Self-pay | Admitting: Pulmonary Disease

## 2023-11-09 LAB — ALPHA-1-ANTITRYPSIN PHENOTYP: A-1 Antitrypsin, Ser: 149 mg/dL (ref 101–187)

## 2023-11-09 NOTE — Telephone Encounter (Signed)
Clinical questions populated and submitted.

## 2023-11-09 NOTE — Telephone Encounter (Signed)
Zott, Lindsay Harris, [name], [name] Good morning. We are trying to get this approved, but her AHI was only 2.8. The RDI is 6.8, but we would need a qualifying secondary. Extreme fatigue will not work. Can the doctor make an addendum to reflect daytime sleepiness/somnolence?

## 2023-11-12 NOTE — Telephone Encounter (Signed)
Message sent to [facility]

## 2023-11-12 NOTE — Telephone Encounter (Signed)
The patient is aware.  See telephone encounter from 11/21 and 11/22.   I will close this encounter.

## 2023-11-19 ENCOUNTER — Telehealth: Payer: Self-pay | Admitting: *Deleted

## 2023-11-19 ENCOUNTER — Encounter: Payer: Self-pay | Admitting: *Deleted

## 2023-11-19 DIAGNOSIS — J4551 Severe persistent asthma with (acute) exacerbation: Secondary | ICD-10-CM

## 2023-11-19 NOTE — Progress Notes (Signed)
Pulmonary Individual Treatment Plan  Patient Details  Name: Lindsay Harris MRN: [id number] Date of Birth: 08-29-1976 Referring Provider:   Flowsheet Row Pulmonary Rehab from 09/24/2023 in [facility] Cardiac and Pulmonary Rehab  Referring Provider Janann Colonel, MD       Initial Encounter Date:  Flowsheet Row Pulmonary Rehab from 09/24/2023 in [facility] Cardiac and Pulmonary Rehab  Date 09/24/23       Visit Diagnosis: Severe persistent asthma with acute exacerbation  Patient's Home Medications on Admission:  Current Outpatient Medications:    albuterol (PROVENTIL) (2.5 MG/3ML) 0.083% nebulizer solution, Take 2.5 mg by nebulization every 4 (four) hours as needed., Disp: , Rfl:    albuterol (VENTOLIN HFA) 108 (90 Base) MCG/ACT inhaler, Inhale 2 puffs into the lungs every 6 (six) hours as needed., Disp: , Rfl:    budesonide-formoterol (SYMBICORT) 160-4.5 MCG/ACT inhaler, Inhale 2 puffs into the lungs in the morning and at bedtime., Disp: 1 each, Rfl: 12   DULoxetine (CYMBALTA) 60 MG capsule, Take 60 mg by mouth daily., Disp: , Rfl:    Elagolix Sodium (ORILISSA) 200 MG TABS, Take 1 tablet (200 mg total) by mouth 2 (two) times daily., Disp: 60 tablet, Rfl: 6   EPINEPHrine 0.3 mg/0.3 mL IJ SOAJ injection, Inject 0.3 mg into the muscle as needed for anaphylaxis., Disp: , Rfl:    Fluticasone-Umeclidin-Vilant (TRELEGY ELLIPTA) 200-62.5-25 MCG/ACT AEPB, Inhale 1 puff into the lungs daily., Disp: 3 each, Rfl: 6   gabapentin (NEURONTIN) 100 MG capsule, Take 100 mg by mouth. 400 mg in the morning. 400 mg in the afternoon. 600 mg at bedtime., Disp: , Rfl:    linaclotide (LINZESS) 290 MCG CAPS capsule, TAKE 1 CAPSULE BY MOUTH DAILY BEFORE BREAKFAST., Disp: 90 capsule, Rfl: 0   methocarbamol (ROBAXIN) 500 MG tablet, Take 500 mg by mouth every 8 (eight) hours as needed., Disp: , Rfl:    montelukast (SINGULAIR) 10 MG tablet, Take 10 mg by mouth at bedtime., Disp: , Rfl:    nicotine (NICODERM CQ - DOSED IN  MG/24 HOURS) 14 mg/24hr patch, Place 1 patch (14 mg total) onto the skin daily., Disp: 30 patch, Rfl: 0   nicotine polacrilex (NICOTINE MINI) 2 MG lozenge, Take 1 lozenge (2 mg total) by mouth as needed for smoking cessation., Disp: 100 tablet, Rfl: 0   ondansetron (ZOFRAN) 4 mg TABS tablet, Take by mouth every 8 (eight) hours as needed., Disp: , Rfl:    pantoprazole (PROTONIX) 40 MG tablet, Take 40 mg by mouth daily., Disp: , Rfl:    propranolol ER (INDERAL LA) 80 MG 24 hr capsule, Take 80 mg by mouth daily., Disp: , Rfl:    rosuvastatin (CRESTOR) 20 MG tablet, Take 20 mg by mouth daily., Disp: , Rfl:    SPIRIVA HANDIHALER 18 MCG inhalation capsule, Place 18 mcg into inhaler and inhale daily., Disp: , Rfl:    SUMAtriptan (IMITREX) 25 MG tablet, Take 25 mg by mouth every 2 (two) hours as needed., Disp: , Rfl:    triamcinolone cream (KENALOG) 0.1 %, Apply topically 2 (two) times daily., Disp: , Rfl:   Past Medical History: Past Medical History:  Diagnosis Date   Asthma    Dyspnea    GERD (gastroesophageal reflux disease)    Headache    migraines    Tobacco Use: Social History   Tobacco Use  Smoking Status Every Day   Current packs/day: 1.00   Average packs/day: 1 pack/day for 37.9 years (37.9 ttl pk-yrs)   Types: Cigarettes  Start date: 86  Smokeless Tobacco Not on file  Tobacco Comments   Has smoked since age 61    Labs: Review Flowsheet       Latest Ref Rng & Units 09/19/2018 07/23/2023 09/04/2023  Labs for ITP Cardiac and Pulmonary Rehab  Cholestrol 100 - 199 mg/dL - - 128   LDL (calc) 0 - 99 mg/dL - - 786   HDL-C >76 mg/dL - - 50   Trlycerides 0 - 149 mg/dL - - 720   Hemoglobin N4B 4.8 - 5.6 % - - 6.1   Bicarbonate 20.0 - 28.0 mmol/L - 23.1  -  TCO2 22 - 32 mmol/L 20  - -  O2 Saturation % - 81.5  -    Details             Pulmonary Assessment Scores:  Pulmonary Assessment Scores     Row Name 09/24/23 1642         ADL UCSD   ADL Phase Entry     SOB  Score total 117     Rest 5     Walk 5     Stairs 5     Bath 5     Dress 5     Shop 5       CAT Score   CAT Score 40       mMRC Score   mMRC Score 4              UCSD: Self-administered rating of dyspnea associated with activities of daily living (ADLs) 6-point scale (0 = "not at all" to 5 = "maximal or unable to do because of breathlessness")  Scoring Scores range from 0 to 120.  Minimally important difference is 5 units  CAT: CAT can identify the health impairment of COPD patients and is better correlated with disease progression.  CAT has a scoring range of zero to 40. The CAT score is classified into four groups of low (less than 10), medium (10 - 20), high (21-30) and very high (31-40) based on the impact level of disease on health status. A CAT score over 10 suggests significant symptoms.  A worsening CAT score could be explained by an exacerbation, poor medication adherence, poor inhaler technique, or progression of COPD or comorbid conditions.  CAT MCID is 2 points  mMRC: mMRC (Modified Medical Research Council) Dyspnea Scale is used to assess the degree of baseline functional disability in patients of respiratory disease due to dyspnea. No minimal important difference is established. A decrease in score of 1 point or greater is considered a positive change.   Pulmonary Function Assessment:   Exercise Target Goals: Exercise Program Goal: Individual exercise prescription set using results from initial 6 min walk test and THRR while considering  patient's activity barriers and safety.   Exercise Prescription Goal: Initial exercise prescription builds to 30-45 minutes a day of aerobic activity, 2-3 days per week.  Home exercise guidelines will be given to patient during program as part of exercise prescription that the participant will acknowledge.  Education: Aerobic Exercise: - Group verbal and visual presentation on the components of exercise prescription.  Introduces F.I.T.T principle from ACSM for exercise prescriptions.  Reviews F.I.T.T. principles of aerobic exercise including progression. Written material given at graduation.   Education: Resistance Exercise: - Group verbal and visual presentation on the components of exercise prescription. Introduces F.I.T.T principle from ACSM for exercise prescriptions  Reviews F.I.T.T. principles of resistance exercise including progression. Written material given at  graduation.    Education: Exercise & Equipment Safety: - Individual verbal instruction and demonstration of equipment use and safety with use of the equipment. Flowsheet Row Pulmonary Rehab from 09/24/2023 in [facility] Cardiac and Pulmonary Rehab  Date 09/24/23  Educator MB  Instruction Review Code 1- Verbalizes Understanding       Education: Exercise Physiology & General Exercise Guidelines: - Group verbal and written instruction with models to review the exercise physiology of the cardiovascular system and associated critical values. Provides general exercise guidelines with specific guidelines to those with heart or lung disease.    Education: Flexibility, Balance, Mind/Body Relaxation: - Group verbal and visual presentation with interactive activity on the components of exercise prescription. Introduces F.I.T.T principle from ACSM for exercise prescriptions. Reviews F.I.T.T. principles of flexibility and balance exercise training including progression. Also discusses the mind body connection.  Reviews various relaxation techniques to help reduce and manage stress (i.e. Deep breathing, progressive muscle relaxation, and visualization). Balance handout provided to take home. Written material given at graduation.   Activity Barriers & Risk Stratification:  Activity Barriers & Cardiac Risk Stratification - 09/24/23 1637       Activity Barriers & Cardiac Risk Stratification   Activity Barriers Back Problems;Balance Concerns              6 Minute Walk:  6 Minute Walk     Row Name 09/24/23 1634         6 Minute Walk   Phase Initial     Distance 600 feet     Walk Time 6 minutes     # of Rest Breaks 0     MPH 1.14     METS 2.3     RPE 19     Perceived Dyspnea  4     VO2 Peak 8.04     Symptoms Yes (comment)     Comments SOB, coughing, and back pain     Resting HR 78 bpm     Resting BP 116/76     Resting Oxygen Saturation  88 %     Exercise Oxygen Saturation  during 6 min walk 84 %     Max Ex. HR 83 bpm     Max Ex. BP 124/72     2 Minute Post BP 102/70       Interval HR   1 Minute HR 83     2 Minute HR 81     3 Minute HR 77     4 Minute HR 76     5 Minute HR 79     6 Minute HR 78     2 Minute Post HR 74     Interval Heart Rate? Yes       Interval Oxygen   Interval Oxygen? Yes     Baseline Oxygen Saturation % 88 %     1 Minute Oxygen Saturation % 85 %     1 Minute Liters of Oxygen 2 L     2 Minute Oxygen Saturation % 85 %     2 Minute Liters of Oxygen 2 L     3 Minute Oxygen Saturation % 84 %     3 Minute Liters of Oxygen 2 L     4 Minute Oxygen Saturation % 84 %     4 Minute Liters of Oxygen 2 L     5 Minute Oxygen Saturation % 84 %     5 Minute Liters of Oxygen 2 L  6 Minute Oxygen Saturation % 86 %     6 Minute Liters of Oxygen 2 L     2 Minute Post Oxygen Saturation % 86 %     2 Minute Post Liters of Oxygen 2 L             Oxygen Initial Assessment:  Oxygen Initial Assessment - 08/30/23 1500       Home Oxygen   Home Oxygen Device None    Sleep Oxygen Prescription None    Home Exercise Oxygen Prescription None    Home Resting Oxygen Prescription None    Compliance with Home Oxygen Use Yes      Intervention   Short Term Goals To learn and understand importance of monitoring SPO2 with pulse oximeter and demonstrate accurate use of the pulse oximeter.;To learn and demonstrate proper pursed lip breathing techniques or other breathing techniques. ;To learn and understand  importance of maintaining oxygen saturations>88%;To learn and demonstrate proper use of respiratory medications    Long  Term Goals Verbalizes importance of monitoring SPO2 with pulse oximeter and return demonstration;Maintenance of O2 saturations>88%;Exhibits proper breathing techniques, such as pursed lip breathing or other method taught during program session;Compliance with respiratory medication;Demonstrates proper use of MDI's             Oxygen Re-Evaluation:  Oxygen Re-Evaluation     Row Name 09/27/23 1711 10/08/23 1726           Program Oxygen Prescription   Program Oxygen Prescription -- Continuous;E-Tanks      Liters per minute -- 2        Home Oxygen   Home Oxygen Device -- Home Concentrator;E-Tanks      Sleep Oxygen Prescription -- Continuous  getting a CPAP      Liters per minute -- 2      Home Exercise Oxygen Prescription -- Continuous      Liters per minute -- 2      Home Resting Oxygen Prescription -- Continuous      Liters per minute -- 2      Compliance with Home Oxygen Use -- Yes        Goals/Expected Outcomes   Short Term Goals -- To learn and demonstrate proper pursed lip breathing techniques or other breathing techniques.       Long  Term Goals -- Exhibits proper breathing techniques, such as pursed lip breathing or other method taught during program session      Comments Reviewed PLB technique with pt.  Talked about how it works and it's importance in maintaining their exercise saturations. Informed patient how to perform the Pursed Lipped breathing technique. Told patient to Inhale through the nose and out the mouth with pursed lips to keep their airways open, help oxygenate them better, practice when at rest or doing strenuous activity. Patient Verbalizes understanding of technique and will work on and be reiterated during [facility].      Goals/Expected Outcomes Short: Become more profiecient at using PLB.   Long: Become independent at using PLB. Short:  use PLB with exertion. Long: use PLB on exertion proficiently and independently.               Oxygen Discharge (Final Oxygen Re-Evaluation):  Oxygen Re-Evaluation - 10/08/23 1726       Program Oxygen Prescription   Program Oxygen Prescription Continuous;E-Tanks    Liters per minute 2      Home Oxygen   Home Oxygen Device Home Concentrator;E-Tanks  Sleep Oxygen Prescription Continuous   getting a CPAP   Liters per minute 2    Home Exercise Oxygen Prescription Continuous    Liters per minute 2    Home Resting Oxygen Prescription Continuous    Liters per minute 2    Compliance with Home Oxygen Use Yes      Goals/Expected Outcomes   Short Term Goals To learn and demonstrate proper pursed lip breathing techniques or other breathing techniques.     Long  Term Goals Exhibits proper breathing techniques, such as pursed lip breathing or other method taught during program session    Comments Informed patient how to perform the Pursed Lipped breathing technique. Told patient to Inhale through the nose and out the mouth with pursed lips to keep their airways open, help oxygenate them better, practice when at rest or doing strenuous activity. Patient Verbalizes understanding of technique and will work on and be reiterated during [facility].    Goals/Expected Outcomes Short: use PLB with exertion. Long: use PLB on exertion proficiently and independently.             Initial Exercise Prescription:  Initial Exercise Prescription - 09/24/23 1600       Date of Initial Exercise RX and Referring Provider   Date 09/24/23    Referring Provider Janann Colonel, MD      Oxygen   Maintain Oxygen Saturation 88% or higher      Treadmill   MPH 1.2    Grade 0    Minutes 15    METs 1.92      Recumbant Bike   Level 1    RPM 50    Watts 9    Minutes 15    METs 2.3      NuStep   Level 1    SPM 80    Minutes 15    METs 2.3      Biostep-RELP   Level 1    SPM 50    Minutes 15     METs 2.3      Prescription Details   Frequency (times per week) 2    Duration Progress to 30 minutes of continuous aerobic without signs/symptoms of physical distress      Intensity   THRR 40-80% of Max Heartrate 116-154    Ratings of Perceived Exertion 11-13    Perceived Dyspnea 0-4      Progression   Progression Continue to progress workloads to maintain intensity without signs/symptoms of physical distress.      Resistance Training   Training Prescription Yes    Weight 0 lb    Reps 10-15             Perform Capillary Blood Glucose checks as needed.  Exercise Prescription Changes:   Exercise Prescription Changes     Row Name 09/24/23 1600 10/05/23 0900 10/17/23 1500         Response to Exercise   Blood Pressure (Admit) 116/76 102/58 110/64     Blood Pressure (Exercise) 124/72 110/62 130/62     Blood Pressure (Exit) 102/70 106/58 118/64     Heart Rate (Admit) 78 bpm 68 bpm 72 bpm     Heart Rate (Exercise) 83 bpm 83 bpm 99 bpm     Heart Rate (Exit) 73 bpm 83 bpm 75 bpm     Oxygen Saturation (Admit) 88 % 95 % 95 %     Oxygen Saturation (Exercise) 84 % 94 % 95 %     Oxygen  Saturation (Exit) 85 % 94 % 93 %     Rating of Perceived Exertion (Exercise) 19 13 17      Perceived Dyspnea (Exercise) 4 1 4      Symptoms SOB, coughing, and back pain SOB SOB     Comments results First full day of exercise --     Duration Progress to 30 minutes of  aerobic without signs/symptoms of physical distress Progress to 30 minutes of  aerobic without signs/symptoms of physical distress Progress to 30 minutes of  aerobic without signs/symptoms of physical distress     Intensity THRR New THRR unchanged THRR unchanged       Progression   Progression Continue to progress workloads to maintain intensity without signs/symptoms of physical distress. Continue to progress workloads to maintain intensity without signs/symptoms of physical distress. Continue to progress workloads to maintain  intensity without signs/symptoms of physical distress.     Average METs 2.3 2.15 4.58       Resistance Training   Training Prescription -- Yes Yes     Weight -- 0 lb 0 lb     Reps -- 10-15 10-15       Interval Training   Interval Training -- No No       Oxygen   Oxygen -- -- Continuous     Liters -- -- 2       Treadmill   MPH -- -- 1.5     Grade -- -- 0     Minutes -- -- 15     METs -- -- 2.15       Recumbant Bike   Level -- 1 --     Watts -- 14 --     Minutes -- 15 --     METs -- 2.29 --       NuStep   Level -- 1 --     Minutes -- 15 --     METs -- 2 --       Biostep-RELP   Level -- -- 1     Minutes -- -- 15     METs -- -- 1       Oxygen   Maintain Oxygen Saturation -- 88% or higher 88% or higher              Exercise Comments:   Exercise Comments     Row Name 09/27/23 1710           Exercise Comments First full day of exercise!  Patient was oriented to gym and equipment including functions, settings, policies, and procedures.  Patient's individual exercise prescription and treatment plan were reviewed.  All starting workloads were established based on the results of the 6 minute walk test done at initial orientation visit.  The plan for exercise progression was also introduced and progression will be customized based on patient's performance and goals.                Exercise Goals and Review:   Exercise Goals     Row Name 09/24/23 1640             Exercise Goals   Increase Physical Activity Yes       Intervention Provide advice, education, support and counseling about physical activity/exercise needs.;Develop an individualized exercise prescription for aerobic and resistive training based on initial evaluation findings, risk stratification, comorbidities and participant's personal goals.       Expected Outcomes Short Term: Attend rehab on a regular basis to increase  amount of physical activity.;Long Term: Exercising regularly at least 3-5  days a week.;Long Term: Add in home exercise to make exercise part of routine and to increase amount of physical activity.       Increase Strength and Stamina Yes       Intervention Provide advice, education, support and counseling about physical activity/exercise needs.;Develop an individualized exercise prescription for aerobic and resistive training based on initial evaluation findings, risk stratification, comorbidities and participant's personal goals.       Expected Outcomes Short Term: Increase workloads from initial exercise prescription for resistance, speed, and METs.;Short Term: Perform resistance training exercises routinely during rehab and add in resistance training at home;Long Term: Improve cardiorespiratory fitness, muscular endurance and strength as measured by increased METs and functional capacity ( )       Able to understand and use rate of perceived exertion (RPE) scale Yes       Intervention Provide education and explanation on how to use RPE scale       Expected Outcomes Short Term: Able to use RPE daily in rehab to express subjective intensity level;Long Term:  Able to use RPE to guide intensity level when exercising independently       Able to understand and use Dyspnea scale Yes       Intervention Provide education and explanation on how to use Dyspnea scale       Expected Outcomes Short Term: Able to use Dyspnea scale daily in rehab to express subjective sense of shortness of breath during exertion;Long Term: Able to use Dyspnea scale to guide intensity level when exercising independently       Knowledge and understanding of Target Heart Rate Range (THRR) Yes       Intervention Provide education and explanation of THRR including how the numbers were predicted and where they are located for reference       Expected Outcomes Short Term: Able to state/look up THRR;Long Term: Able to use THRR to govern intensity when exercising independently;Short Term: Able to use daily as  guideline for intensity in rehab       Able to check pulse independently Yes       Intervention Provide education and demonstration on how to check pulse in carotid and radial arteries.;Review the importance of being able to check your own pulse for safety during independent exercise       Expected Outcomes Short Term: Able to explain why pulse checking is important during independent exercise;Long Term: Able to check pulse independently and accurately       Understanding of Exercise Prescription Yes       Intervention Provide education, explanation, and written materials on patient's individual exercise prescription       Expected Outcomes Short Term: Able to explain program exercise prescription;Long Term: Able to explain home exercise prescription to exercise independently                Exercise Goals Re-Evaluation :  Exercise Goals Re-Evaluation     Row Name 09/27/23 1710 10/05/23 0931 10/17/23 1543 11/13/23 0854       Exercise Goal Re-Evaluation   Exercise Goals Review Able to understand and use rate of perceived exertion (RPE) scale;Increase Physical Activity;Knowledge and understanding of Target Heart Rate Range (THRR);Understanding of Exercise Prescription;Increase Strength and Stamina;Able to understand and use Dyspnea scale;Able to check pulse independently Increase Physical Activity;Increase Strength and Stamina;Understanding of Exercise Prescription Increase Physical Activity;Increase Strength and Stamina;Understanding of Exercise Prescription Increase Physical Activity;Increase Strength and Stamina;Understanding  of Exercise Prescription    Comments Reviewed RPE and dyspnea scale, THR and program prescription with pt today.  Pt voiced understanding and was given a copy of goals to take home. Tuesday is off to a good start in the program. She was able to work at level 1 on the recumbent bike and T4 nustep during her first rehab session. She did not do any walking during her first  session. She also did range of motion for resistance training exercises. We will continue to monitor her progress in the program. Meleni is doing well in rehab although she has only attended one session since the last review. During this session she began using the treadmill at a speed of 1.5 mph with no incline. She also used the biostep at level 1. We will remind her of the importance of good attendance in order to see results and continue to monitor her progress in the program. Taima has not attended rehab since 10/21. We will reach out to determine her status. We will monitor her progress upon return to the program.    Expected Outcomes Short: Use RPE daily to regulate intensity.  Long: Follow program prescription in THR. Short: Continue to follow initial exercise prescription. Long: Continue exercise to improve strength and stamina. Short: Attend rehab more regularly. Long: Continue exercise to improve strength and stamina. Short: Attend rehab. Long: Continue exercise to improve strength and stamina.             Discharge Exercise Prescription (Final Exercise Prescription Changes):  Exercise Prescription Changes - 10/17/23 1500       Response to Exercise   Blood Pressure (Admit) 110/64    Blood Pressure (Exercise) 130/62    Blood Pressure (Exit) 118/64    Heart Rate (Admit) 72 bpm    Heart Rate (Exercise) 99 bpm    Heart Rate (Exit) 75 bpm    Oxygen Saturation (Admit) 95 %    Oxygen Saturation (Exercise) 95 %    Oxygen Saturation (Exit) 93 %    Rating of Perceived Exertion (Exercise) 17    Perceived Dyspnea (Exercise) 4    Symptoms SOB    Duration Progress to 30 minutes of  aerobic without signs/symptoms of physical distress    Intensity THRR unchanged      Progression   Progression Continue to progress workloads to maintain intensity without signs/symptoms of physical distress.    Average METs 4.58      Resistance Training   Training Prescription Yes    Weight 0 lb     Reps 10-15      Interval Training   Interval Training No      Oxygen   Oxygen Continuous    Liters 2      Treadmill   MPH 1.5    Grade 0    Minutes 15    METs 2.15      Biostep-RELP   Level 1    Minutes 15    METs 1      Oxygen   Maintain Oxygen Saturation 88% or higher             Nutrition:  Target Goals: Understanding of nutrition guidelines, daily intake of sodium 1500mg , cholesterol 200mg , calories 30% from fat and 7% or less from saturated fats, daily to have 5 or more servings of fruits and vegetables.  Education: All About Nutrition: -Group instruction provided by verbal, written material, interactive activities, discussions, models, and posters to present general guidelines for heart healthy  nutrition including fat, fiber, MyPlate, the role of sodium in heart healthy nutrition, utilization of the nutrition label, and utilization of this knowledge for meal planning. Follow up email sent as well. Written material given at graduation.   Biometrics:  Pre Biometrics - 09/24/23 1640       Pre Biometrics   Height 5' 4.8" (1.646 m)    Weight 212 lb 14.4 oz (96.6 kg)    Waist Circumference 42 inches    Hip Circumference 46 inches    Waist to Hip Ratio 0.91 %    BMI (Calculated) 35.64    Single Leg Stand 30 seconds              Nutrition Therapy Plan and Nutrition Goals:  Nutrition Therapy & Goals - 09/24/23 1645       Personal Nutrition Goals   Nutrition Goal Meet with RD on 10/14      Intervention Plan   Intervention Prescribe, educate and counsel regarding individualized specific dietary modifications aiming towards targeted core components such as weight, hypertension, lipid management, diabetes, heart failure and other comorbidities.;Nutrition handout(s) given to patient.    Expected Outcomes Short Term Goal: Understand basic principles of dietary content, such as calories, fat, sodium, cholesterol and nutrients.;Long Term Goal: Adherence to  prescribed nutrition plan.;Short Term Goal: A plan has been developed with personal nutrition goals set during dietitian appointment.             Nutrition Assessments:  MEDIFICTS Score Key: >=70 Need to make dietary changes  40-70 Heart Healthy Diet <= 40 Therapeutic Level Cholesterol Diet  Flowsheet Row Pulmonary Rehab from 09/24/2023 in [facility] Card[facility] Pulmonary [facility]  Picture Your Plate Total Score on Admission 51      Picture Your Plate Scores: <09 Unhealthy dietary pattern with much room for improvement. 41-50 Dietary pattern unlikely to meet recommendations for good health and room for improvement. 51-60 More healthful dietary pattern, with some room for improvement.  >60 Healthy dietary pattern, although there may be some specific behaviors that could be improved.   Nutrition Goals Re-Evaluation:  Nutrition Goals Re-Evaluation     Row Name 10/08/23 1725             Goals   Comment Patient was informed on why it is important to maintain a balanced diet when dealing with Respiratory issues. Explained that it takes a lot of energy to breath and when they are short of breath often they will need to have a good diet to help keep up with the calories they are expending for breathing.       Expected Outcome Short: Choose and plan snacks accordingly to patients caloric intake to improve breathing. Long: Maintain a diet independently that meets their caloric intake to aid in daily shortness of breath.                Nutrition Goals Discharge (Final Nutrition Goals Re-Evaluation):  Nutrition Goals Re-Evaluation - 10/08/23 1725       Goals   Comment Patient was informed on why it is important to maintain a balanced diet when dealing with Respiratory issues. Explained that it takes a lot of energy to breath and when they are short of breath often they will need to have a good diet to help keep up with the calories they are expending for breathing.    Expected Outcome  Short: Choose and plan snacks accordingly to patients caloric intake to improve breathing. Long: Maintain a diet independently that  meets their caloric intake to aid in daily shortness of breath.             Psychosocial: Target Goals: Acknowledge presence or absence of significant depression and/or stress, maximize coping skills, provide positive support system. Participant is able to verbalize types and ability to use techniques and skills needed for reducing stress and depression.   Education: Stress, Anxiety, and Depression - Group verbal and visual presentation to define topics covered.  Reviews how body is impacted by stress, anxiety, and depression.  Also discusses healthy ways to reduce stress and to treat/manage anxiety and depression.  Written material given at graduation.   Education: Sleep Hygiene -Provides group verbal and written instruction about how sleep can affect your health.  Define sleep hygiene, discuss sleep cycles and impact of sleep habits. Review good sleep hygiene tips.    Initial Review & Psychosocial Screening:  Initial Psych Review & Screening - 08/30/23 1441       Initial Review   Current issues with History of Depression;Current Anxiety/Panic;Current Stress Concerns;Current Sleep Concerns    Source of Stress Concerns Chronic Illness    Comments colon cancer/cervical cancer possibility; new sleep apnea      Family Dynamics   Good Support System? Yes   family     Barriers   Psychosocial barriers to participate in program There are no identifiable barriers or psychosocial needs.;The patient should benefit from training in stress management and relaxation.      Screening Interventions   Interventions Encouraged to exercise;To provide support and resources with identified psychosocial needs    Expected Outcomes Short Term goal: Utilizing psychosocial counselor, staff and physician to assist with identification of specific Stressors or current issues  interfering with healing process. Setting desired goal for each stressor or current issue identified.;Long Term Goal: Stressors or current issues are controlled or eliminated.;Short Term goal: Identification and review with participant of any Quality of Life or Depression concerns found by scoring the questionnaire.;Long Term goal: The participant improves quality of Life and PHQ9 Scores as seen by post scores and/or verbalization of changes             Quality of Life Scores:  Scores of 19 and below usually indicate a poorer quality of life in these areas.  A difference of  2-3 points is a clinically meaningful difference.  A difference of 2-3 points in the total score of the Quality of Life Index has been associated with significant improvement in overall quality of life, self-image, physical symptoms, and general health in studies assessing change in quality of life.  PHQ-9: Review Flowsheet       09/24/2023 06/29/2023  Depression screen PHQ 2/9  Decreased Interest 0 3  Down, Depressed, Hopeless 0 1  PHQ - 2 Score 0 4  Altered sleeping 0 3  Tired, decreased energy 2 3  Change in appetite 0 3  Feeling bad or failure about yourself  0 3  Trouble concentrating 0 3  Moving slowly or fidgety/restless 0 3  Suicidal thoughts 0 0  PHQ-9 Score 2 22  Difficult doing work/chores Extremely dIfficult Extremely dIfficult    Details           Interpretation of Total Score  Total Score Depression Severity:  1-4 = Minimal depression, 5-9 = Mild depression, 10-14 = Moderate depression, 15-19 = Moderately severe depression, 20-27 = Severe depression   Psychosocial Evaluation and Intervention:  Psychosocial Evaluation - 08/30/23 1451       Psychosocial  Evaluation & Interventions   Interventions Encouraged to exercise with the program and follow exercise prescription;Stress management education;Relaxation education    Comments Tiawanna is coming to pulmonary rehab with chronic asthma. She  has been recently diagnosed with sleep apnea, colon biopsies and cervical markers concerning for cancer. She also has 7 herniated discs which she is waiting for the inflammation to improve before they can do any type of intervention. Her husband and daughters are very supportive. She is motivated to stop smoking and has cut back to 1 pack a day. She is working with her doctor to help manage her depression and anxiety. Her current dose of zoloft does not feel to be working, so she is seeing her doctor tomorrow. Recently she has gained weight with unknown reason and she wants to get back to her normal weight. She is motivated to come to the program to work on bettering her health and improving her stamina    Expected Outcomes Short: attend pulmonary rehab for education and exercise. Long: develop and maintain positive self care habits    Continue Psychosocial Services  Follow up required by staff             Psychosocial Re-Evaluation:   Psychosocial Discharge (Final Psychosocial Re-Evaluation):   Education: Education Goals: Education classes will be provided on a weekly basis, covering required topics. Participant will state understanding/return demonstration of topics presented.  Learning Barriers/Preferences:  Learning Barriers/Preferences - 08/30/23 1441       Learning Barriers/Preferences   Learning Barriers None    Learning Preferences None             General Pulmonary Education Topics:  Infection Prevention: - Provides verbal and written material to individual with discussion of infection control including proper hand washing and proper equipment cleaning during exercise session. Flowsheet Row Pulmonary Rehab from 09/24/2023 in [facility] Cardiac and Pulmonary Rehab  Date 09/24/23  Educator MB  Instruction Review Code 1- Verbalizes Understanding       Falls Prevention: - Provides verbal and written material to individual with discussion of falls prevention and  safety. Flowsheet Row Pulmonary Rehab from 09/24/2023 in [facility] Cardiac and Pulmonary Rehab  Date 09/24/23  Educator MB  Instruction Review Code 1- Verbalizes Understanding       Chronic Lung Disease Review: - Group verbal instruction with posters, models, PowerPoint presentations and videos,  to review new updates, new respiratory medications, new advancements in procedures and treatments. Providing information on websites and "800" numbers for continued self-education. Includes information about supplement oxygen, available portable oxygen systems, continuous and intermittent flow rates, oxygen safety, concentrators, and Medicare reimbursement for oxygen. Explanation of Pulmonary Drugs, including class, frequency, complications, importance of spacers, rinsing mouth after steroid MDI's, and proper cleaning methods for nebulizers. Review of basic lung anatomy and physiology related to function, structure, and complications of lung disease. Review of risk factors. Discussion about methods for diagnosing sleep apnea and types of masks and machines for OSA. Includes a review of the use of types of environmental controls: home humidity, furnaces, filters, dust mite/pet prevention, HEPA vacuums. Discussion about weather changes, air quality and the benefits of nasal washing. Instruction on Warning signs, infection symptoms, calling MD promptly, preventive modes, and value of vaccinations. Review of effective airway clearance, coughing and/or vibration techniques. Emphasizing that all should Create an Action Plan. Written material given at graduation. Flowsheet Row Pulmonary Rehab from 09/24/2023 in [facility] Cardiac and Pulmonary Rehab  Education need identified 09/24/23  AED/CPR: - Group verbal and written instruction with the use of models to demonstrate the basic use of the AED with the basic ABC's of resuscitation.    Anatomy and Cardiac Procedures: - Group verbal and visual presentation and models  provide information about basic cardiac anatomy and function. Reviews the testing methods done to diagnose heart disease and the outcomes of the test results. Describes the treatment choices: Medical Management, Angioplasty, or Coronary Bypass Surgery for treating various heart conditions including Myocardial Infarction, Angina, Valve Disease, and Cardiac Arrhythmias.  Written material given at graduation.   Medication Safety: - Group verbal and visual instruction to review commonly prescribed medications for heart and lung disease. Reviews the medication, class of the drug, and side effects. Includes the steps to properly store meds and maintain the prescription regimen.  Written material given at graduation.   Other: -Provides group and verbal instruction on various topics (see comments)   Knowledge Questionnaire Score:  Knowledge Questionnaire Score - 09/24/23 1648       Knowledge Questionnaire Score   Pre Score 16/18              Core Components/Risk Factors/Patient Goals at Admission:  Personal Goals and Risk Factors at Admission - 09/24/23 1648       Core Components/Risk Factors/Patient Goals on Admission    Weight Management Yes;Weight Loss    Intervention Weight Management: Develop a combined nutrition and exercise program designed to reach desired caloric intake, while maintaining appropriate intake of nutrient and fiber, sodium and fats, and appropriate energy expenditure required for the weight goal.;Weight Management: Provide education and appropriate resources to help participant work on and attain dietary goals.;Weight Management/Obesity: Establish reasonable short term and long term weight goals.    Admit Weight 212 lb 14.4 oz (96.6 kg)    Goal Weight: Short Term 200 lb (90.7 kg)    Goal Weight: Long Term 150 lb (68 kg)    Expected Outcomes Long Term: Adherence to nutrition and physical activity/exercise program aimed toward attainment of established weight goal;Short  Term: Continue to assess and modify interventions until short term weight is achieved;Weight Loss: Understanding of general recommendations for a balanced deficit meal plan, which promotes 1-2 lb weight loss per week and includes a negative energy balance of [phone] kcal/d;Understanding of distribution of calorie intake throughout the day with the consumption of 4-5 meals/snacks;Understanding recommendations for meals to include 15-35% energy as protein, 25-35% energy from fat, 35-60% energy from carbohydrates, less than 200mg  of dietary cholesterol, 20-35 gm of total fiber daily    Tobacco Cessation Yes    Number of packs per day 4    Intervention Assist the participant in steps to quit. Provide individualized education and counseling about committing to Tobacco Cessation, relapse prevention, and pharmacological support that can be provided by physician.;Education officer, environmental, assist with locating and accessing local/national Quit Smoking programs, and support quit date choice.    Expected Outcomes Short Term: Will demonstrate readiness to quit, by selecting a quit date.;Short Term: Will quit all tobacco product use, adhering to prevention of relapse plan.;Long Term: Complete abstinence from all tobacco products for at least 12 months from quit date.             Education:Diabetes - Individual verbal and written instruction to review signs/symptoms of diabetes, desired ranges of glucose level fasting, after meals and with exercise. Acknowledge that pre and post exercise glucose checks will be done for 3 sessions at entry of program.   Know  Your Numbers and Heart Failure: - Group verbal and visual instruction to discuss disease risk factors for cardiac and pulmonary disease and treatment options.  Reviews associated critical values for Overweight/Obesity, Hypertension, Cholesterol, and Diabetes.  Discusses basics of heart failure: signs/symptoms and treatments.  Introduces Heart Failure  Zone chart for action plan for heart failure.  Written material given at graduation.   Core Components/Risk Factors/Patient Goals Review:   Goals and Risk Factor Review     Row Name 10/08/23 1728             Core Components/Risk Factors/Patient Goals Review   Personal Goals Review Improve shortness of breath with ADL's       Review Spoke to patient about their shortness of breath and what they can do to improve. Patient has been informed of breathing techniques when starting the program. Patient is informed to tell staff if they have had any med changes and that certain meds they are taking or not taking can be causing shortness of breath.       Expected Outcomes Short: Attend [facility] regularly to improve shortness of breath with ADL's. Long: maintain independence with ADL's                Core Components/Risk Factors/Patient Goals at Discharge (Final Review):   Goals and Risk Factor Review - 10/08/23 1728       Core Components/Risk Factors/Patient Goals Review   Personal Goals Review Improve shortness of breath with ADL's    Review Spoke to patient about their shortness of breath and what they can do to improve. Patient has been informed of breathing techniques when starting the program. Patient is informed to tell staff if they have had any med changes and that certain meds they are taking or not taking can be causing shortness of breath.    Expected Outcomes Short: Attend [facility] regularly to improve shortness of breath with ADL's. Long: maintain independence with ADL's             ITP Comments:  ITP Comments     Row Name 08/30/23 1451 09/24/23 1634 09/27/23 1710 10/17/23 0852 11/07/23 0931   ITP Comments Initial phone call completed. Diagnosis can be found in [facility] 8/19. EP Orientation scheduled for Monday 9/23 at 2:30.       Leontina is a current tobacco user. Intervention for tobacco cessation was provided at the initial medical review. She was asked about readiness  to quit and reported she is interested in more information. Patient was advised and educated about tobacco cessation using combination therapy, tobacco cessation classes, quit line, and quit smoking apps. Patient demonstrated understanding of this material. Staff will continue to provide encouragement and follow up with the patient throughout the program. Completed and gym orientation. Initial ITP created and sent for review to Dr. Jinny Sanders, Medical Director. Tashanna is a current tobacco user. Intervention for tobacco cessation was provided at the initial medical review. She was asked about readiness to quit. Patient was advised and educated about tobacco cessation using combination therapy, tobacco cessation classes, quit line, and quit smoking apps. Patient demonstrated understanding of this material. Staff will continue to provide encouragement and follow up with the patient throughout the program. First full day of exercise!  Patient was oriented to gym and equipment including functions, settings, policies, and procedures.  Patient's individual exercise prescription and treatment plan were reviewed.  All starting workloads were established based on the results of the 6 minute walk test done at  initial orientation visit.  The plan for exercise progression was also introduced and progression will be customized based on patient's performance and goals. 30 Day review completed. Medical Director ITP review done, changes made as directed, and signed approval by Medical Director.    new to program 30 Day review completed. Medical Director ITP review done, changes made as directed, and signed approval by Medical Director.            Comments: Discharge ITP

## 2023-11-19 NOTE — Telephone Encounter (Signed)
Called to check on Lindsay Harris, she has not been to rehab since 10/21. She has been placed on bed rest and scheduled to have back surgery. She has been unable to come to rehab and will discharge at this time. [name] is aware that she may return when recovered and cleared to return with a new referral.

## 2023-11-19 NOTE — Progress Notes (Signed)
Discharge Summary:  Lindsay Harris  (DOB: October 15, 1976)  Lakie discharged early from pulmonary rehab due to back surgery. She completed 3/36 sessions.    6 Minute Walk     Row Name 09/24/23 1634         6 Minute Walk   Phase Initial     Distance 600 feet     Walk Time 6 minutes     # of Rest Breaks 0     MPH 1.14     METS 2.3     RPE 19     Perceived Dyspnea  4     VO2 Peak 8.04     Symptoms Yes (comment)     Comments SOB, coughing, and back pain     Resting HR 78 bpm     Resting BP 116/76     Resting Oxygen Saturation  88 %     Exercise Oxygen Saturation  during 6 min walk 84 %     Max Ex. HR 83 bpm     Max Ex. BP 124/72     2 Minute Post BP 102/70       Interval HR   1 Minute HR 83     2 Minute HR 81     3 Minute HR 77     4 Minute HR 76     5 Minute HR 79     6 Minute HR 78     2 Minute Post HR 74     Interval Heart Rate? Yes       Interval Oxygen   Interval Oxygen? Yes     Baseline Oxygen Saturation % 88 %     1 Minute Oxygen Saturation % 85 %     1 Minute Liters of Oxygen 2 L     2 Minute Oxygen Saturation % 85 %     2 Minute Liters of Oxygen 2 L     3 Minute Oxygen Saturation % 84 %     3 Minute Liters of Oxygen 2 L     4 Minute Oxygen Saturation % 84 %     4 Minute Liters of Oxygen 2 L     5 Minute Oxygen Saturation % 84 %     5 Minute Liters of Oxygen 2 L     6 Minute Oxygen Saturation % 86 %     6 Minute Liters of Oxygen 2 L     2 Minute Post Oxygen Saturation % 86 %     2 Minute Post Liters of Oxygen 2 L

## 2023-11-19 NOTE — Telephone Encounter (Signed)
Pharmacy Patient Advocate Encounter  Received notification from [facility] that Prior Authorization for Trelegy has been DENIED.  Full denial letter will be uploaded to the media tab. See denial reason below.   PA #/Case ID/Reference #:

## 2023-11-22 MED ORDER — SPIRIVA HANDIHALER 18 MCG IN CAPS: 18.0000 ug | ORAL_CAPSULE | Freq: Every day | RESPIRATORY_TRACT | 3 refills | Status: AC

## 2023-11-22 MED ORDER — ALBUTEROL SULFATE HFA 108 (90 BASE) MCG/ACT IN AERS: 2.0000 | INHALATION_SPRAY | Freq: Four times a day (QID) | RESPIRATORY_TRACT | 3 refills | Status: AC | PRN

## 2023-11-22 NOTE — Addendum Note (Signed)
Addended by: Hyacinth Meeker on: 11/22/2023 03:59 PM   Modules accepted: Orders

## 2023-11-22 NOTE — Telephone Encounter (Signed)
Patient advised of denial for Trelegy. And to continue with Symbicort and Spiriva, as advised by Dr. Larinda Buttery. Nothing further needed.

## 2023-12-27 ENCOUNTER — Encounter: Payer: Self-pay | Admitting: Gastroenterology

## 2023-12-27 ENCOUNTER — Other Ambulatory Visit: Payer: Self-pay

## 2023-12-27 ENCOUNTER — Ambulatory Visit (INDEPENDENT_AMBULATORY_CARE_PROVIDER_SITE_OTHER): Payer: Medicaid Other | Admitting: Gastroenterology

## 2023-12-27 VITALS — BP 113/75 | HR 75 | Temp 97.9°F | Ht 64.5 in | Wt 233.2 lb

## 2023-12-27 DIAGNOSIS — K5909 Other constipation: Secondary | ICD-10-CM

## 2023-12-27 DIAGNOSIS — Z800 Family history of malignant neoplasm of digestive organs: Secondary | ICD-10-CM

## 2023-12-27 DIAGNOSIS — R140 Abdominal distension (gaseous): Secondary | ICD-10-CM | POA: Diagnosis not present

## 2023-12-27 DIAGNOSIS — K625 Hemorrhage of anus and rectum: Secondary | ICD-10-CM | POA: Diagnosis not present

## 2023-12-27 DIAGNOSIS — K581 Irritable bowel syndrome with constipation: Secondary | ICD-10-CM

## 2023-12-27 DIAGNOSIS — R1013 Epigastric pain: Secondary | ICD-10-CM

## 2023-12-27 DIAGNOSIS — F17210 Nicotine dependence, cigarettes, uncomplicated: Secondary | ICD-10-CM

## 2023-12-27 DIAGNOSIS — K640 First degree hemorrhoids: Secondary | ICD-10-CM

## 2023-12-27 MED ORDER — PANTOPRAZOLE SODIUM 40 MG PO TBEC: 40.0000 mg | DELAYED_RELEASE_TABLET | Freq: Two times a day (BID) | ORAL | 0 refills | Status: DC

## 2023-12-27 NOTE — Progress Notes (Signed)
 PROCEDURE NOTE: The patient presents with symptomatic grade 1 hemorrhoids, unresponsive to maximal medical therapy, requesting rubber band ligation of his/her hemorrhoidal disease.  All risks, benefits and alternative forms of therapy were described and informed consent was obtained.  The decision was made to band the RP internal hemorrhoid, and the [facility] System was used to perform band ligation without complication.  Digital anorectal examination was then performed to assure proper positioning of the band, and to adjust the banded tissue as required.  The patient was discharged home without pain or other issues.  Dietary and behavioral recommendations were given and (if necessary - prescriptions were given), along with follow-up instructions.  The patient will return 3 weeks for follow-up and possible additional banding as required.  No complications were encountered and the patient tolerated the procedure well.

## 2023-12-27 NOTE — Patient Instructions (Signed)
 Gave linzess 290 mcg samples. Please take 1 tablet daily 30 minutes before breakfast. Let us know how it works and we can call you in a prescription for you.

## 2023-12-27 NOTE — Progress Notes (Signed)
 Corinn JONELLE Brooklyn, MD [address]  [address]  [place], [place] [address]  Main: [phone]  Fax: [phone]    Gastroenterology Consultation  Referring Provider:     No ref. provider found Primary Care Physician:  Rollene Therisa BRAVO, FNP (Inactive) Primary Gastroenterologist:  Dr. Corinn JONELLE Brooklyn Reason for Consultation: Chronic constipation, rectal bleeding        HPI:   Lindsay Harris is a 48 y.o. female referred by Rollene Therisa BRAVO, FNP (Inactive)  for consultation & management of rectal bleeding, chronic constipation.  Patient reports that she has been suffering from constipation for several years associated with severe abdominal pain and bloating.  She has tried nonstimulant laxatives which do not help move bowels, stimulant laxatives resulted in severe abdominal cramps and doubling over.  She also reports intermittent bright red blood per rectum.  Her mother was diagnosed with colon cancer at age 68 her uncle had colon cancer in his 77s.  She does state incorporating dietary fiber as well as adequate intake of water .  No known history of anemia She does smoke tobacco, has COPD on 2 L home oxygen  Follow-up visit 12/27/2023 Ms. Hentges is here for follow-up of chronic constipation.  She has tried IBS Rela samples which helped, however her insurance company wanted her to try Amitiza or Linzess .  I started her on Linzess  145 mcg daily which significantly improved her bowel movements.  However, she has to strain to initiate bowel movement which she is hard, has some abdominal bloating as well.  She also reports epigastric and right upper quadrant burning pain, with abdominal bloating, has been intermittently on prednisone  courses for COPD exacerbation.  She is currently on Protonix  40 mg once a day.  She reports ongoing hemorrhoidal symptoms which include rectal bleeding, burning, itching, pressure, discomfort and is interested to undergo hemorrhoid ligation.  NSAIDs:  None  Antiplts/Anticoagulants/Anti thrombotics: None  GI Procedures:  Colonoscopy 08/09/2023 - Two 10 to 15 mm polyps in the sigmoid colon, removed with a hot snare. Resected and retrieved. - Diverticulosis in the recto- sigmoid colon and in the sigmoid colon. - Non- bleeding external hemorrhoids. Sigmoid Colon Polyp, x 2 - TUBULAR ADENOMA (1). - HYPERPLASTIC POLYP (1). - NEGATIVE FOR HIGH-GRADE DYSPLASIA AND MALIGNANCY.  Past Medical History:  Diagnosis Date   Asthma    Dyspnea    GERD (gastroesophageal reflux disease)    Headache    migraines    Past Surgical History:  Procedure Laterality Date   COLONOSCOPY WITH PROPOFOL  N/A 08/09/2023   Procedure: COLONOSCOPY WITH BIOPSY;  Surgeon: Brooklyn Corinn Skiff, MD;  Location: [facility];  Service: Endoscopy;  Laterality: N/A;   POLYPECTOMY N/A 08/09/2023   Procedure: POLYPECTOMY;  Surgeon: Brooklyn Corinn Skiff, MD;  Location: [facility];  Service: Endoscopy;  Laterality: N/A;     Current Outpatient Medications:    albuterol  (PROVENTIL ) (2.5 MG/3ML) 0.083% nebulizer solution, Take 2.5 mg by nebulization every 4 (four) hours as needed., Disp: , Rfl:    albuterol  (VENTOLIN  HFA) 108 (90 Base) MCG/ACT inhaler, Inhale 2 puffs into the lungs every 6 (six) hours as needed., Disp: 3 each, Rfl: 3   budesonide -formoterol  (SYMBICORT ) 160-4.5 MCG/ACT inhaler, Inhale 2 puffs into the lungs in the morning and at bedtime., Disp: 1 each, Rfl: 12   cyclobenzaprine (FLEXERIL) 5 MG tablet, Take 5-10 mg by mouth every 8 (eight) hours as needed., Disp: , Rfl:    DULoxetine  (CYMBALTA ) 60 MG capsule, Take  60 mg by mouth daily., Disp: , Rfl:    Elagolix Sodium  (ORILISSA ) 200 MG TABS, Take 1 tablet (200 mg total) by mouth 2 (two) times daily., Disp: 60 tablet, Rfl: 6   EPINEPHrine 0.3 mg/0.3 mL IJ SOAJ injection, Inject 0.3 mg into the muscle as needed for anaphylaxis., Disp: , Rfl:    eszopiclone (LUNESTA) 1 MG TABS tablet, Take 1 mg by mouth  at bedtime as needed., Disp: , Rfl:    furosemide (LASIX) 20 MG tablet, Take 20 mg by mouth daily as needed., Disp: , Rfl:    gabapentin  (NEURONTIN ) 300 MG capsule, Take by mouth 2 (two) times daily., Disp: , Rfl:    linaclotide  (LINZESS ) 290 MCG CAPS capsule, TAKE 1 CAPSULE BY MOUTH DAILY BEFORE BREAKFAST., Disp: 90 capsule, Rfl: 0   montelukast  (SINGULAIR ) 10 MG tablet, Take 10 mg by mouth at bedtime., Disp: , Rfl:    ondansetron  (ZOFRAN ) 4 mg TABS tablet, Take by mouth every 8 (eight) hours as needed., Disp: , Rfl:    pantoprazole  (PROTONIX ) 40 MG tablet, Take 40 mg by mouth daily., Disp: , Rfl:    propranolol  ER (INDERAL  LA) 80 MG 24 hr capsule, Take 80 mg by mouth daily., Disp: , Rfl:    rosuvastatin  (CRESTOR ) 20 MG tablet, Take 20 mg by mouth daily., Disp: , Rfl:    SPIRIVA  HANDIHALER 18 MCG inhalation capsule, Place 1 capsule (18 mcg total) into inhaler and inhale daily., Disp: 90 capsule, Rfl: 3   SUMAtriptan (IMITREX) 25 MG tablet, Take 25 mg by mouth every 2 (two) hours as needed., Disp: , Rfl:    triamcinolone  cream (KENALOG) 0.1 %, Apply topically 2 (two) times daily., Disp: , Rfl:    Family History  Problem Relation Age of Onset   Colon cancer Mother 75   Ovarian cancer Sister 19       Stage 3   Breast cancer Maternal Grandmother        twice: early 33s and mid 63s   Colon cancer Maternal Uncle        early 51s     Social History   Tobacco Use   Smoking status: Every Day    Current packs/day: 1.00    Average packs/day: 1 pack/day for 38.0 years (38.0 ttl pk-yrs)    Types: Cigarettes    Start date: 68   Tobacco comments:    Has smoked since age 6  Substance Use Topics   Alcohol use: No   Drug use: No    Allergies as of 12/27/2023 - Review Complete 12/27/2023  Allergen Reaction Noted   Amoxicillin Anaphylaxis and Hives 03/30/2014   Morphine  Hives, Itching, Nausea Only, and Shortness Of Breath 05/25/2023    Review of Systems:    All systems reviewed and  negative except where noted in HPI.   Physical Exam:  BP 113/75 (BP Location: Left Arm, Patient Position: Sitting, Cuff Size: Large)   Pulse 75   Temp 97.9 F (36.6 C) (Oral)   Ht 5' 4.5 (1.638 m)   Wt 233 lb 4 oz (105.8 kg)   BMI 39.42 kg/m  No LMP recorded.  General:   Alert,  Well-developed, well-nourished, pleasant and cooperative in NAD Head:  Normocephalic and atraumatic. Eyes:  Sclera clear, no icterus.   Conjunctiva pink. Ears:  Normal auditory acuity. Nose:  No deformity, discharge, or lesions. Mouth:  No deformity or lesions,oropharynx pink & moist. Neck:  Supple; no masses or thyromegaly. Lungs:  Respirations even and unlabored.  Clear throughout to auscultation.   No wheezes, crackles, or rhonchi. No acute distress. Heart:  Regular rate and rhythm; no murmurs, clicks, rubs, or gallops. Abdomen:  Normal bowel sounds. Soft, epigastric tenderness, and diffusely, moderately distended, tympanic to percussion without masses, hepatosplenomegaly or hernias noted.  No guarding or rebound tenderness.   Rectal: Not performed Msk:  Symmetrical without gross deformities. Good, equal movement & strength bilaterally. Pulses:  Normal pulses noted. Extremities:  No clubbing or edema.  No cyanosis. Neurologic:  Alert and oriented x3;  grossly normal neurologically. Skin:  Intact without significant lesions or rashes. No jaundice. Psych:  Alert and cooperative. Normal mood and affect.  Imaging Studies: Reviewed  Assessment and Plan:   WINEFRED HILLESHEIM is a 48 y.o. female with history of chronic tobacco use is seen in consultation for chronic constipation with abdominal pain and rectal bleeding, strong family history of colon cancer below 85 years of age.  Also, with symptoms of epigastric burning, upper abdominal bloating, intermittent prednisone  use for last 1 year for COPD  Recommend H. pylori breath test Increase Protonix  to 40 mg p.o. twice daily before meals for 1 month,  prescription sent  Chronic constipation Increase Linzess  to 290 mcg daily, samples provided  Grade 1 symptomatic hemorrhoids refractory to medical therapy Discussed about outpatient hemorrhoid ligation, procedure, risks and benefits Consent obtained Proceed with hemorrhoid ligation today  Follow up in 3 weeks   Corinn JONELLE Brooklyn, MD

## 2023-12-29 LAB — H. PYLORI BREATH TEST: H pylori Breath Test: NEGATIVE

## 2024-01-08 ENCOUNTER — Ambulatory Visit: Payer: Medicaid Other | Admitting: Podiatry

## 2024-01-26 ENCOUNTER — Other Ambulatory Visit: Payer: Self-pay | Admitting: Gastroenterology

## 2024-01-26 DIAGNOSIS — R1013 Epigastric pain: Secondary | ICD-10-CM

## 2024-01-28 ENCOUNTER — Other Ambulatory Visit: Payer: Self-pay

## 2024-01-28 ENCOUNTER — Other Ambulatory Visit: Payer: Self-pay | Admitting: Gastroenterology

## 2024-01-28 DIAGNOSIS — R1013 Epigastric pain: Secondary | ICD-10-CM

## 2024-01-29 ENCOUNTER — Encounter: Payer: Self-pay | Admitting: Gastroenterology

## 2024-01-29 ENCOUNTER — Ambulatory Visit (INDEPENDENT_AMBULATORY_CARE_PROVIDER_SITE_OTHER): Payer: Medicaid Other | Admitting: Gastroenterology

## 2024-01-29 VITALS — BP 130/80 | HR 103 | Temp 97.8°F | Ht 64.5 in | Wt 238.4 lb

## 2024-01-29 DIAGNOSIS — K5909 Other constipation: Secondary | ICD-10-CM

## 2024-01-29 DIAGNOSIS — K640 First degree hemorrhoids: Secondary | ICD-10-CM

## 2024-01-29 MED ORDER — LACTULOSE 10 GM/15ML PO SOLN: 20.0000 g | Freq: Every day | ORAL | 3 refills | Status: AC

## 2024-01-29 NOTE — Progress Notes (Signed)
PROCEDURE NOTE: The patient presents with symptomatic grade 1 hemorrhoids, unresponsive to maximal medical therapy, requesting rubber band ligation of his/her hemorrhoidal disease.  All risks, benefits and alternative forms of therapy were described and informed consent was obtained.  The decision was made to band the RA internal hemorrhoid, and the [facility] O'Regan System was used to perform band ligation without complication.  Digital anorectal examination was then performed to assure proper positioning of the band, and to adjust the banded tissue as required.  The patient was discharged home without pain or other issues.  Dietary and behavioral recommendations were given and (if necessary - prescriptions were given), along with follow-up instructions.  The patient will return 3 weeks for follow-up and possible additional banding as required.  No complications were encountered and the patient tolerated the procedure well.  Recommend to add lactulose 30 mL along with Linzess 290 mcg daily

## 2024-02-07 ENCOUNTER — Telehealth: Payer: Self-pay

## 2024-02-07 ENCOUNTER — Ambulatory Visit: Payer: Medicaid Other | Admitting: Pulmonary Disease

## 2024-02-07 ENCOUNTER — Encounter: Payer: Self-pay | Admitting: Pulmonary Disease

## 2024-02-07 VITALS — BP 130/100 | HR 91 | Temp 97.7°F | Ht 64.5 in | Wt 238.2 lb

## 2024-02-07 DIAGNOSIS — G4733 Obstructive sleep apnea (adult) (pediatric): Secondary | ICD-10-CM

## 2024-02-07 DIAGNOSIS — J9611 Chronic respiratory failure with hypoxia: Secondary | ICD-10-CM | POA: Diagnosis not present

## 2024-02-07 DIAGNOSIS — J4489 Other specified chronic obstructive pulmonary disease: Secondary | ICD-10-CM

## 2024-02-07 MED ORDER — AEROCHAMBER MV MISC
0 refills | Status: AC
Start: 2024-02-07 — End: ?

## 2024-02-07 MED ORDER — OHTUVAYRE 3 MG/2.5ML IN SUSP: 3.0000 mg | Freq: Two times a day (BID) | RESPIRATORY_TRACT | Status: DC

## 2024-02-07 MED ORDER — AEROCHAMBER MV MISC: 0 refills | Status: DC

## 2024-02-07 NOTE — Telephone Encounter (Signed)
Patient was seen in the office today. Dr. Larinda Buttery has ordered [facility] for the patient. She has signed the form and it has been faxed to the pharmacy team for completion.

## 2024-02-07 NOTE — Progress Notes (Addendum)
 Synopsis: Referred in by No ref. provider found   Subjective:   PATIENT ID: Lindsay Harris GENDER: female DOB: 05-Jul-1976, MRN: [id number]  Chief Complaint  Patient presents with   Follow-up    Cough, shortness of breath on exertion and at rest, and wheezing.     HPI Lindsay Harris is a 48 y.o female patient with a past medical history of severe persistent asthma on triple therapy presenting to the pulmonary clinic today for follow up on her asthma/COPD overlap  I last saw earlier in August 2024 for asthma and she seemed to be in an asthma exacerbation. Referred to the ED and was managed with IV steroids and discharged on 5 days prednisone. She did better and presents today for a follow up visit.   She has had asthma for the past 32 years with multiple exacerbations one of them at age 62 requiring intubation. She has been feeling short of breath for the past month that has been woresening but hasn't seen anyone due to insurance issues. She has been on symbicort 80-4.5 2 puffs bid and spiriva for the past 6 months.   We increased her Symbicort during the last visit and currently is on spiriva as well.     FH: Strong family history of asthma in her mother and grandmother.   SH: Active smoker, smokes 1 ppd for 30 years, no alcohol use and no illicit drug use. Has 1 Dog at home.   ROS All systems were reviewed and are negative except for the above.  Objective:   Vitals:   02/07/24 1526  BP: (!) 130/100  Pulse: 91  Temp: 97.7 F (36.5 C)  TempSrc: Temporal  SpO2: 99%  Weight: 238 lb 3.2 oz (108 kg)  Height: 5' 4.5" (1.638 m)   99% on RA BMI Readings from Last 3 Encounters:  02/07/24 40.26 kg/m  01/29/24 40.29 kg/m  12/27/23 39.42 kg/m   Wt Readings from Last 3 Encounters:  02/07/24 238 lb 3.2 oz (108 kg)  01/29/24 238 lb 6 oz (108.1 kg)  12/27/23 233 lb 4 oz (105.8 kg)    Physical Exam GEN: Moderate respiratory distress with accessory muscle use.  HEENT: Supple Neck,  Reactive Pupils, EOMI  CVS: Normal S1, Normal S2, RRR, No murmurs or ES appreciated  Lungs: Inspiratory wheezing heard diffusely. No rales/ronchi.   Abdomen: Soft, non tender, non distended, + BS  Extremities: Warm and well perfused, No edema  Skin: No suspicious lesions appreciated  Psych: Good affect.   Ancillary Information   CBC    Component Value Date/Time   WBC 9.7 07/23/2023 1653   RBC 4.63 07/23/2023 1653   HGB 14.5 07/23/2023 1653   HCT 42.3 07/23/2023 1653   PLT 332 07/23/2023 1653   MCV 91.4 07/23/2023 1653   MCH 31.3 07/23/2023 1653   MCHC 34.3 07/23/2023 1653   RDW 13.4 07/23/2023 1653   LYMPHSABS 4.0 07/23/2023 1653   MONOABS 0.6 07/23/2023 1653   EOSABS 0.3 07/23/2023 1653   BASOSABS 0.1 07/23/2023 1653        No data to display         Labs and imaging were reviewed.   Assessment & Plan:  [name] is a 48 y.o female patient with a past medical history of severe persistent asthma on triple therapy presenting to the pulmonary clinic today for further management of her asthma.   #Severe COPD Gold stage II with Chronic hypoxic respiratory failure on 2lpm #Asthma overlap   EOS  300  Total IgE 136  walk test 09/24/2023 632feet  A1AT normal.   []  c/w fluticasone-umecledinium-vilanterol [Trelegy Elipta] 1puff once a day  []  Albuterol as needed.  []  C/w pulmonary rehab (Had to pause for a period of time) []  Start Ohtuvayre and assess response  []  Will possibly start biologics on next visit.  []  Will need Echocardiogram for evaluation of PH.   #Mild OSA (AHI 6.8 2024) Reports lound snoring and apneic episodes. Overweight and increased neck circumference with extreme fatigue during the day. Significant Daytime sleepiness and multiple naps during the day.    []  c/w Auto CPAP 5-15 with 2L O2  []  Resmed Report reviewed and compliance is adequate AHI improved to less than 5. Patient feels significantly better with imprved daily fatigue.   #Tobacco use  disorder  Discussed the importance of smoking cessation and dramatic impact on copd progression. She is ready to stop and has decided on Monday next week. Provided NRT.   No follow-ups on file.  I spent minutes caring for this patient today, including preparing to see the patient, obtaining a medical history , reviewing a separately obtained history, performing a medically appropriate examination and/or evaluation, documenting clinical information in the electronic health record, and independently interpreting results (not separately reported/billed) and communicating results to the patient/family/caregiver  Janann Colonel, MD [facility] 02/07/2024 3:38 PM

## 2024-02-12 ENCOUNTER — Telehealth: Payer: Self-pay | Admitting: Pharmacist

## 2024-02-12 NOTE — Telephone Encounter (Signed)
 Received Ohtuvayre new start paperwork. Completed form and faxed with clinicals and insurance card copy to [employer]   Phone#: [phone] Fax#: [phone]   Chesley Mires, PharmD, MPH, BCPS, CPP Clinical Pharmacist (Rheumatology and Pulmonology)

## 2024-02-18 ENCOUNTER — Encounter: Payer: Self-pay | Admitting: Urology

## 2024-02-18 NOTE — Telephone Encounter (Signed)
 Per Telephone encounter from 2/25-  Received Ohtuvayre new start paperwork. Completed form and faxed with clinicals and insurance card copy to [employer]   Phone#: [phone] Fax#: [phone]    Chesley Mires, PharmD, MPH, BCPS, CPP Clinical Pharmacist (Rheumatology and Pulmonology)       Closing this encounter.

## 2024-02-25 NOTE — Telephone Encounter (Signed)
 Shelly from [facility] states needs prior authorization. Shelly phone number is [phone].

## 2024-02-25 NOTE — Telephone Encounter (Signed)
 Submitted a Prior Authorization request to [facility] for [facility] via CoverMyMeds. Will update once we receive a response.  Key: [id number]

## 2024-02-26 ENCOUNTER — Ambulatory Visit (INDEPENDENT_AMBULATORY_CARE_PROVIDER_SITE_OTHER): Payer: Medicaid Other | Admitting: Gastroenterology

## 2024-02-26 ENCOUNTER — Encounter: Payer: Self-pay | Admitting: Gastroenterology

## 2024-02-26 VITALS — BP 128/77 | HR 97 | Temp 97.5°F | Ht 64.5 in | Wt 234.4 lb

## 2024-02-26 DIAGNOSIS — K5909 Other constipation: Secondary | ICD-10-CM

## 2024-02-26 DIAGNOSIS — K640 First degree hemorrhoids: Secondary | ICD-10-CM | POA: Diagnosis not present

## 2024-02-26 NOTE — Progress Notes (Signed)
 PROCEDURE NOTE: The patient presents with symptomatic grade 1 hemorrhoids, unresponsive to maximal medical therapy, requesting rubber band ligation of his/her hemorrhoidal disease.  All risks, benefits and alternative forms of therapy were described and informed consent was obtained.  The decision was made to band the LL internal hemorrhoid, and the [facility] O'Regan System was used to perform band ligation without complication.  Digital anorectal examination was then performed to assure proper positioning of the band, and to adjust the banded tissue as required.  The patient was discharged home without pain or other issues.  Dietary and behavioral recommendations were given and (if necessary - prescriptions were given), along with follow-up instructions.  The patient will return  as needed for follow-up and possible additional banding as required.  No complications were encountered and the patient tolerated the procedure well.  Recommended to add lactulose 30 mL along with Linzess 290 mcg daily constipation resolved, however she does not like the taste of lactulose  Reports burning in left upper quadrant triggered after a meal, advised to increase Protonix 40 mg to twice daily before meals for at least 2 to 4 weeks  Lannette Donath, MD

## 2024-02-27 NOTE — Telephone Encounter (Signed)
 Received a notice on CMM regarding Prior Authorization from [facility] for [facility]. Authorization has been DENIED - no denial reason listed. Will await return of denial either from insurance via fax or mail before submitting appeal  Chesley Mires, PharmD, MPH, BCPS, CPP Clinical Pharmacist (Rheumatology and Pulmonology)

## 2024-02-28 ENCOUNTER — Telehealth: Payer: Self-pay | Admitting: Pulmonary Disease

## 2024-02-28 NOTE — Telephone Encounter (Signed)
 Patient states needs notes to say discussed CPAP usage. For insurance to pay. Patient phone number is [phone].

## 2024-02-29 NOTE — Telephone Encounter (Signed)
 Dr. Larinda Buttery please addendum office visit 02/07/2024.

## 2024-02-29 NOTE — Telephone Encounter (Signed)
 I have notified the patient. Nothing further needed.

## 2024-03-13 ENCOUNTER — Telehealth: Payer: Self-pay | Admitting: Pulmonary Disease

## 2024-03-13 NOTE — Telephone Encounter (Signed)
 Per Kennyth Arnold at [facility]- I apologize this in regard to her CPAP, her insurance requires she has a follow-up appt within 21 to 90 days of receiving the CPAP that discuss she is using and benefitting from the PAP. She did have her follow-up appt. 02/07/24 but it only states using, they also need to note she is benefitting   ----- Message -----  From: Bunnie Philips  Sent: 03/13/2024   9:56 AM EDT  To: Kennyth Arnold Zott  Subject: RE: Compliance documentation                   I apologize what order is this in regards to? The last order was confirmed 11/12/23  ----- Message -----  From: Trina Ao  Sent: 03/12/2024   9:32 AM EDT  To: Bunnie Philips  Subject: Compliance documentation                       Patients insurance requires the she has a follow-up appt that discussing using and benefitting from cpap, I see where the patient called to ask for an addendum to her 02/07/24 office note that state this, an addendum was made but only notes using there is no benefit noted, we would need this added to the notes in order for the insurance to continue to cover the equipment.  It can simply say using and benefit from PAP. Please advise Thank You

## 2024-04-20 ENCOUNTER — Emergency Department (HOSPITAL_COMMUNITY)

## 2024-04-20 ENCOUNTER — Observation Stay (HOSPITAL_COMMUNITY)
Admission: EM | Admit: 2024-04-20 | Discharge: 2024-04-21 | Discharge status: Against Medical Advice | Attending: Internal Medicine | Admitting: Internal Medicine

## 2024-04-20 ENCOUNTER — Other Ambulatory Visit: Payer: Self-pay

## 2024-04-20 DIAGNOSIS — Z79899 Other long term (current) drug therapy: Secondary | ICD-10-CM | POA: Insufficient documentation

## 2024-04-20 DIAGNOSIS — R200 Anesthesia of skin: Secondary | ICD-10-CM | POA: Diagnosis present

## 2024-04-20 DIAGNOSIS — R464 Slowness and poor responsiveness: Secondary | ICD-10-CM | POA: Diagnosis not present

## 2024-04-20 DIAGNOSIS — F419 Anxiety disorder, unspecified: Secondary | ICD-10-CM | POA: Insufficient documentation

## 2024-04-20 DIAGNOSIS — F17210 Nicotine dependence, cigarettes, uncomplicated: Secondary | ICD-10-CM | POA: Insufficient documentation

## 2024-04-20 DIAGNOSIS — G4730 Sleep apnea, unspecified: Secondary | ICD-10-CM | POA: Diagnosis not present

## 2024-04-20 DIAGNOSIS — R2990 Unspecified symptoms and signs involving the nervous system: Secondary | ICD-10-CM

## 2024-04-20 DIAGNOSIS — R4182 Altered mental status, unspecified: Secondary | ICD-10-CM | POA: Diagnosis not present

## 2024-04-20 DIAGNOSIS — J449 Chronic obstructive pulmonary disease, unspecified: Secondary | ICD-10-CM | POA: Diagnosis present

## 2024-04-20 DIAGNOSIS — F411 Generalized anxiety disorder: Secondary | ICD-10-CM | POA: Diagnosis present

## 2024-04-20 DIAGNOSIS — G9340 Encephalopathy, unspecified: Principal | ICD-10-CM | POA: Diagnosis present

## 2024-04-20 DIAGNOSIS — G894 Chronic pain syndrome: Secondary | ICD-10-CM | POA: Diagnosis present

## 2024-04-20 DIAGNOSIS — J9611 Chronic respiratory failure with hypoxia: Secondary | ICD-10-CM | POA: Diagnosis present

## 2024-04-20 DIAGNOSIS — J9601 Acute respiratory failure with hypoxia: Secondary | ICD-10-CM | POA: Insufficient documentation

## 2024-04-20 DIAGNOSIS — G8929 Other chronic pain: Secondary | ICD-10-CM | POA: Diagnosis present

## 2024-04-20 LAB — DIFFERENTIAL
Abs Immature Granulocytes: 0.12 10*3/uL — ABNORMAL HIGH (ref 0.00–0.07)
Basophils Absolute: 0.1 10*3/uL (ref 0.0–0.1)
Basophils Relative: 1 %
Eosinophils Absolute: 0.3 10*3/uL (ref 0.0–0.5)
Eosinophils Relative: 2 %
Immature Granulocytes: 1 %
Lymphocytes Relative: 37 %
Lymphs Abs: 3.9 10*3/uL (ref 0.7–4.0)
Monocytes Absolute: 0.6 10*3/uL (ref 0.1–1.0)
Monocytes Relative: 5 %
Neutro Abs: 5.8 10*3/uL (ref 1.7–7.7)
Neutrophils Relative %: 54 %

## 2024-04-20 LAB — CBC
HCT: 42.6 % (ref 36.0–46.0)
Hemoglobin: 14.1 g/dL (ref 12.0–15.0)
MCH: 30.4 pg (ref 26.0–34.0)
MCHC: 33.1 g/dL (ref 30.0–36.0)
MCV: 91.8 fL (ref 80.0–100.0)
Platelets: 233 10*3/uL (ref 150–400)
RBC: 4.64 MIL/uL (ref 3.87–5.11)
RDW: 15.6 % — ABNORMAL HIGH (ref 11.5–15.5)
WBC: 10.7 10*3/uL — ABNORMAL HIGH (ref 4.0–10.5)
nRBC: 0 % (ref 0.0–0.2)

## 2024-04-20 LAB — I-STAT CHEM 8, ED
BUN: 5 mg/dL — ABNORMAL LOW (ref 6–20)
Calcium, Ion: 1.13 mmol/L — ABNORMAL LOW (ref 1.15–1.40)
Chloride: 101 mmol/L (ref 98–111)
Creatinine, Ser: 0.8 mg/dL (ref 0.44–1.00)
Glucose, Bld: 93 mg/dL (ref 70–99)
HCT: 43 % (ref 36.0–46.0)
Hemoglobin: 14.6 g/dL (ref 12.0–15.0)
Potassium: 4 mmol/L (ref 3.5–5.1)
Sodium: 139 mmol/L (ref 135–145)
TCO2: 25 mmol/L (ref 22–32)

## 2024-04-20 LAB — PROTIME-INR
INR: 0.9 (ref 0.8–1.2)
Prothrombin Time: 12.6 s (ref 11.4–15.2)

## 2024-04-20 LAB — ETHANOL: Alcohol, Ethyl (B): <15 mg/dL (ref <15)

## 2024-04-20 LAB — APTT: aPTT: 28 s (ref 24–36)

## 2024-04-20 LAB — CBG MONITORING, ED: Glucose-Capillary: 94 mg/dL (ref 70–99)

## 2024-04-20 MED ORDER — IOHEXOL 350 MG/ML SOLN
75.0000 mL | Freq: Once | INTRAVENOUS | Status: AC | PRN
  Administered 2024-04-20: 75 mL via INTRAVENOUS

## 2024-04-20 NOTE — ED Notes (Signed)
 Transported to MRI

## 2024-04-20 NOTE — ED Notes (Signed)
Patient currently at MRI

## 2024-04-20 NOTE — Consult Note (Incomplete)
 NEUROLOGY CONSULT NOTE   Date of service: Apr 21, 2024 Patient Name: Lindsay Harris MRN:  [id number] DOB:  Mar 04, 1976 Chief Complaint: "Strokelike symptoms-sudden onset of right-sided neck pressure and right-sided weakness/numbness" Requesting Provider: Kelsey Patricia, MD  History of Present Illness  Lindsay Harris is a 48 y.o. female with hx of asthma on home oxygen, migraine headaches, chronic back pain, presented to the emergency department for evaluation of sudden onset of right-sided numbness and weakness as well as slurred speech. She was sitting up and crocheting when suddenly felt a pop on the back of her head and then had numbness all over the right side of her body.  Her speech also became slurred and she started to have some tongue automatisms.  The daughter came in later and said that this all started with her right leg cramping and then shaking all over her body and eyes rolled in the head and she became noncommunicative.  On arrival, a lot of exam was limited due to pain in both her right upper and lower extremities.  LKW: 9:30 PM Modified rankin score: 2-Slight disability-UNABLE to perform all activities but does not need assistance due to chronic back pain IV Thrombolysis: No, MRI negative EVT: No, MRI negative NIHSS components Score: Comment  1a Level of Conscious 0[x]  1[]  2[]  3[]      1b LOC Questions 0[]  1[x]  2[]       1c LOC Commands 0[x]  1[]  2[]       2 Best Gaze 0[x]  1[]  2[]       3 Visual 0[x]  1[]  2[]  3[]      4 Facial Palsy 0[x]  1[]  2[]  3[]      5a Motor Arm - left 0[]  1[x]  2[]  3[]  4[]  UN[]    5b Motor Arm - Right 0[]  1[]  2[]  3[x]  4[]  UN[]    6a Motor Leg - Left 0[]  1[x]  2[]  3[]  4[]  UN[]    6b Motor Leg - Right 0[]  1[]  2[x]  3[]  4[]  UN[]    7 Limb Ataxia 0[x]  1[]  2[]  UN[]      8 Sensory 0[]  1[]  2[x]  UN[]      9 Best Language 0[x]  1[]  2[]  3[]      10 Dysarthria 0[]  1[]  2[x]  UN[]      11 Extinct. and Inattention 0[x]  1[]  2[]       TOTAL: 12      ROS  Unable to  reliably ascertain  Past History   Past Medical History:  Diagnosis Date   Asthma    Dyspnea    GERD (gastroesophageal reflux disease)    Headache    migraines    Past Surgical History:  Procedure Laterality Date   COLONOSCOPY WITH PROPOFOL  N/A 08/09/2023   Procedure: COLONOSCOPY WITH BIOPSY;  Surgeon: Selena Daily, MD;  Location: [facility];  Service: Endoscopy;  Laterality: N/A;   POLYPECTOMY N/A 08/09/2023   Procedure: POLYPECTOMY;  Surgeon: Selena Daily, MD;  Location: [facility];  Service: Endoscopy;  Laterality: N/A;    Family History: Family History  Problem Relation Age of Onset   Colon cancer Mother 48   Ovarian cancer Sister 18       Stage 3   Breast cancer Maternal Grandmother        twice: early 57s and mid 23s   Colon cancer Maternal Uncle        early 10s    Social History  reports that she has been smoking cigarettes. She started smoking about 38 years ago. She has a 38.3 pack-year smoking history. She does  not have any smokeless tobacco history on file. She reports that she does not drink alcohol and does not use drugs.  Allergies  Allergen Reactions   Amoxicillin Anaphylaxis and Hives   Morphine  Hives, Itching, Nausea Only and Shortness Of Breath    Medications  No current facility-administered medications for this encounter.  Current Outpatient Medications:    albuterol  (PROVENTIL ) (2.5 MG/3ML) 0.083% nebulizer solution, Take 2.5 mg by nebulization every 4 (four) hours as needed., Disp: , Rfl:    albuterol  (VENTOLIN  HFA) 108 (90 Base) MCG/ACT inhaler, Inhale 2 puffs into the lungs every 6 (six) hours as needed., Disp: 3 each, Rfl: 3   budesonide -formoterol  (SYMBICORT ) 160-4.5 MCG/ACT inhaler, Inhale 2 puffs into the lungs in the morning and at bedtime., Disp: 1 each, Rfl: 12   cyclobenzaprine (FLEXERIL) 5 MG tablet, Take 5-10 mg by mouth every 8 (eight) hours as needed., Disp: , Rfl:    DULoxetine (CYMBALTA) 60 MG  capsule, Take 60 mg by mouth daily., Disp: , Rfl:    Elagolix Sodium  (ORILISSA ) 200 MG TABS, Take 1 tablet (200 mg total) by mouth 2 (two) times daily., Disp: 60 tablet, Rfl: 6   Ensifentrine  (OHTUVAYRE ) 3 MG/2.5ML SUSP, Inhale 3 mg into the lungs in the morning and at bedtime., Disp: , Rfl:    EPINEPHrine 0.3 mg/0.3 mL IJ SOAJ injection, Inject 0.3 mg into the muscle as needed for anaphylaxis., Disp: , Rfl:    eszopiclone (LUNESTA) 1 MG TABS tablet, Take 1 mg by mouth at bedtime as needed., Disp: , Rfl:    furosemide (LASIX) 20 MG tablet, Take 20 mg by mouth daily as needed., Disp: , Rfl:    gabapentin (NEURONTIN) 100 MG capsule, Take 100 mg by mouth., Disp: , Rfl:    lactulose  (CHRONULAC ) 10 GM/15ML solution, Take 30 mLs (20 g total) by mouth daily., Disp: 900 mL, Rfl: 3   linaclotide  (LINZESS ) 290 MCG CAPS capsule, TAKE 1 CAPSULE BY MOUTH DAILY BEFORE BREAKFAST., Disp: 90 capsule, Rfl: 0   methocarbamol (ROBAXIN) 500 MG tablet, Take 500 mg by mouth every 8 (eight) hours as needed for muscle spasms., Disp: , Rfl:    montelukast (SINGULAIR) 10 MG tablet, Take 10 mg by mouth at bedtime., Disp: , Rfl:    ondansetron  (ZOFRAN ) 4 mg TABS tablet, Take by mouth every 8 (eight) hours as needed., Disp: , Rfl:    pantoprazole  (PROTONIX ) 40 MG tablet, Take 1 tablet (40 mg total) by mouth 2 (two) times daily before a meal., Disp: 60 tablet, Rfl: 0   propranolol ER (INDERAL LA) 80 MG 24 hr capsule, Take 80 mg by mouth daily., Disp: , Rfl:    rosuvastatin (CRESTOR) 20 MG tablet, Take 20 mg by mouth daily., Disp: , Rfl:    Spacer/Aero-Holding Chambers (AEROCHAMBER MV) inhaler, Use as instructed, Disp: 1 each, Rfl: 0   SPIRIVA  HANDIHALER 18 MCG inhalation capsule, Place 1 capsule (18 mcg total) into inhaler and inhale daily., Disp: 90 capsule, Rfl: 3   SUMAtriptan (IMITREX) 25 MG tablet, Take 25 mg by mouth every 2 (two) hours as needed., Disp: , Rfl:    triamcinolone cream (KENALOG) 0.1 %, Apply topically 2  (two) times daily., Disp: , Rfl:   Vitals   Vitals:   04/20/24 2300 04/21/24 0019 04/21/24 0030 04/21/24 0045  BP:  (!) 141/82 124/73 137/77  Pulse:  92 92 91  Resp:  (!) 25 19 15   Temp:  98.4 F (36.9 C)    TempSrc:  Oral  SpO2:  100% 100% 100%  Weight: 110 kg       Body mass index is 40.98 kg/m.  Physical Exam  General: Awake alert HEENT: Normocephalic atraumatic Chest: Clear Cardiovascular: Regular rate rhythm Musculoskeletal: Palpable tenderness on both shoulders and arms as well as thighs  Neurological exam She is awake alert oriented to self, got her age correct but could not tell the correct month. Speech is severely dysarthric and guttural sounding No evidence of aphasia Cranials: Pupils equal round reactive to light, extraocular movements intact, visual fields full, facial symmetry difficult to ascertain as she is edentulous and has somewhat of asymmetric looking face.  This finding it seems pretty symmetric.  She has tongue protruding to the right rhythmically-which she said started after she heard a pop in her head. Motor examination: Exam severely limited due to pain in the shoulders and back.  On attempting to passively raise her right arm up, she has a bobbing movement of the arm but it hits that right before 10 seconds.  Left upper extremity also is somewhat limited by pain but she is able to lift it up for 10 seconds with minimal drift.  Both lower extremities have a drift with the drift being worse in the right lower extremity-again severely limited exam due to back pain with a minute at passively tried to lift her legs. Sensation diminished on the right with sharp cut off in the midline and sliding cueing for vibration on the forehead. Coordination difficult to assess but no gross dysmetria noted  Labs/Imaging/Neurodiagnostic studies   CBC:  Recent Labs  Lab 05-14-2024 2335 2024/05/14 2337  WBC 10.7*  --   NEUTROABS 5.8  --   HGB 14.1 14.6  HCT 42.6 43.0   MCV 91.8  --   PLT 233  --    Basic Metabolic Panel:  Lab Results  Component Value Date   NA 139 14-May-2024   K 4.0 05/14/2024   CO2 21 (L) 2024/05/14   GLUCOSE 93 2024-05-14   BUN 5 (L) 2024-05-14   CREATININE 0.80 May 14, 2024   CALCIUM 9.1 2024/05/14   GFRNONAA >60 May 14, 2024   GFRAA >60 12/30/2015   Lipid Panel:  Lab Results  Component Value Date   LDLCALC 229 (H) 09/04/2023   HgbA1c:  Lab Results  Component Value Date   HGBA1C 6.1 (H) 09/04/2023   Urine Drug Screen: No results found for: "LABOPIA", "COCAINSCRNUR", "LABBENZ", "AMPHETMU", "THCU", "LABBARB"  Alcohol Level     Component Value Date/Time   [facility] <15 05-14-2024 2335   INR  Lab Results  Component Value Date   INR 0.9 05/14/24   APTT  Lab Results  Component Value Date   APTT 28 05/14/2024   CT Head without contrast(Personally reviewed): Aspects 10.  No bleed  CT angio Head and Neck with contrast(Personally reviewed): No ELVO  MRI Brain(Personally reviewed): No acute intracranial normality.  No stroke  ASSESSMENT   CATIE ECHARD is a 48 y.o. female past medical history of chronic back pain, asthma on home oxygen, migraine headaches presenting for evaluation of sudden onset of right-sided numbness and weakness as well as slurred speech that started) 9:30 PM when she heard a pop in the back of her head. Her examination is consistent with weakness in all 4 extremities.  That is a lot more associated with pain than true weakness on my assessment.  That said, the right side definitely appears weaker than the left.  Her sensory exam although has a lot of  functional components of it as described above.  After the CT head and CT angiography head and neck did not reveal anything acute, due to her young age and focality of symptoms, I rushed her for a stat MRI of the brain-which was negative for acute process.  Family came later and reported that she started getting some cramps in her right leg and then  whole body shaking and then eyes rolling back in her becoming less responsive.  Seizure cannot be ruled out with that description.  She has a lot of muscle relaxants on her med list-polypharmacy should also be a consideration  Impression:  Acute encephalopathy, etiology under investigation-cannot rule out postictal state versus polypharmacy Evaluate for seizure  RECOMMENDATIONS  Admit to hospitalist Frequent rechecks Telemetry Routine EEG in the morning Can give 1 dose of Ativan for the tongue automatisms and also for rigidity and stiffness that she is feeling in her back.  I would hold off on loading her with any antiseizure medication till EEG is done.  If there is any abnormality, can consider with I have low suspicion for this being seizure. Check urinalysis and chest x-ray Check toxicology screen Neurology will follow with you Plan discussed with Dr. Alayne Hubert  ______________________________________________________________________    Signed, Tona Francis, MD [facility]

## 2024-04-20 NOTE — ED Provider Notes (Signed)
 [place] EMERGENCY DEPARTMENT AT [facility] Provider Note   CSN: [id number] Arrival date & time: 04/20/24  2329  An emergency department physician performed an initial assessment on this suspected stroke patient at 2330.  History  Chief Complaint  Patient presents with   Code Stroke    CHRYSANTHE Harris is a 48 y.o. female.  The history is provided by the patient, the EMS personnel and medical records.  Lindsay Harris is a 48 y.o. female who presents to the Emergency Department complaining of code stroke.  She presents to the ED by EMS for evaluation of stroke like symptoms.  At 2100 she had a sudden pop to the right side of her head with severe pain and numbness to the right hemibody.   Pt with hx/o COPD, chronic back pain.  Additional history available from patient's daughter after patient's initial assessment.  Daughter states that patient had a spasm in her right leg followed by general shaking throughout her body with her eyes rolled back in her head.  She was not interacting during this episode.  She was interacting at time of EMS arrival.      Home Medications Prior to Admission medications   Medication Sig Start Date End Date Taking? Authorizing Provider  albuterol  (PROVENTIL ) (2.5 MG/3ML) 0.083% nebulizer solution Take 2.5 mg by nebulization every 4 (four) hours as needed. 06/18/23  Yes [provider]  albuterol  (VENTOLIN  HFA) 108 (90 Base) MCG/ACT inhaler Inhale 2 puffs into the lungs every 6 (six) hours as needed. 11/22/23  Yes Assaker, Marianne Shirts, MD  budesonide -formoterol  (SYMBICORT ) 160-4.5 MCG/ACT inhaler Inhale 2 puffs into the lungs in the morning and at bedtime. 08/06/23  Yes Assaker, Marianne Shirts, MD  cyclobenzaprine (FLEXERIL) 5 MG tablet Take 5-10 mg by mouth every 8 (eight) hours as needed. 11/27/23  Yes [provider]  DULoxetine (CYMBALTA) 60 MG capsule Take 60 mg by mouth 2 (two) times daily. 08/31/23  Yes [provider]   Elagolix Sodium  (ORILISSA ) 200 MG TABS Take 1 tablet (200 mg total) by mouth 2 (two) times daily. 07/30/23  Yes Dove, Myra C, MD  EPINEPHrine 0.3 mg/0.3 mL IJ SOAJ injection Inject 0.3 mg into the muscle as needed for anaphylaxis. 05/29/23  Yes [provider]  eszopiclone (LUNESTA) 1 MG TABS tablet Take 1 mg by mouth at bedtime as needed. 12/03/23  Yes [provider]  furosemide (LASIX) 20 MG tablet Take 20 mg by mouth daily as needed. 11/27/23  Yes [provider]  gabapentin (NEURONTIN) 100 MG capsule Take 400 mg by mouth 2 (two) times daily. Take 400mg  in the morning and 400mg  in the afternoon. 01/21/24  Yes [provider]  gabapentin (NEURONTIN) 600 MG tablet Take 600 mg by mouth at bedtime. 03/05/24  Yes [provider]  lactulose  (CHRONULAC ) 10 GM/15ML solution Take 30 mLs (20 g total) by mouth daily. 01/29/24  Yes Vanga, Elson Halon, MD  linaclotide  (LINZESS ) 290 MCG CAPS capsule TAKE 1 CAPSULE BY MOUTH DAILY BEFORE BREAKFAST. 09/26/23  Yes Vanga, Elson Halon, MD  methocarbamol (ROBAXIN) 500 MG tablet Take 1,000 mg by mouth daily. 01/21/24  Yes [provider]  montelukast (SINGULAIR) 10 MG tablet Take 10 mg by mouth at bedtime. 05/03/23  Yes [provider]  ondansetron  (ZOFRAN ) 4 mg TABS tablet Take by mouth every 8 (eight) hours as needed. 05/03/23  Yes [provider]  pantoprazole  (PROTONIX ) 40 MG tablet Take 1 tablet (40 mg total) by mouth 2 (two)  times daily before a meal. 12/27/23  Yes Vanga, Elson Halon, MD  propranolol ER (INDERAL LA) 120 MG 24 hr capsule Take 120 mg by mouth daily. 02/29/24  Yes [provider]  rosuvastatin (CRESTOR) 20 MG tablet Take 20 mg by mouth daily. 10/05/23  Yes [provider]  Spacer/Aero-Holding Chambers (AEROCHAMBER MV) inhaler Use as instructed 02/07/24  Yes Assaker, Marianne Shirts, MD  SPIRIVA  HANDIHALER 18 MCG inhalation capsule Place 1 capsule (18 mcg total) into inhaler  and inhale daily. 11/22/23  Yes Assaker, Marianne Shirts, MD  SUMAtriptan (IMITREX) 25 MG tablet Take 25 mg by mouth every 2 (two) hours as needed. 05/28/23  Yes [provider]  triamcinolone cream (KENALOG) 0.1 % Apply topically 2 (two) times daily. 05/30/23  Yes [provider]      Allergies    Amoxicillin and Morphine     Review of Systems   Review of Systems  All other systems reviewed and are negative.   Physical Exam Updated Vital Signs BP 105/75   Pulse 94   Temp 98.4 F (36.9 C) (Oral)   Resp 14   Wt 110 kg   SpO2 99%   BMI 40.98 kg/m  Physical Exam Vitals and nursing note reviewed.  Constitutional:      Appearance: She is well-developed.  HENT:     Head: Normocephalic and atraumatic.  Cardiovascular:     Rate and Rhythm: Normal rate and regular rhythm.     Heart sounds: No murmur heard. Pulmonary:     Effort: Pulmonary effort is normal. No respiratory distress.     Breath sounds: Normal breath sounds.  Abdominal:     Palpations: Abdomen is soft.     Tenderness: There is no abdominal tenderness. There is no guarding or rebound.  Musculoskeletal:        General: No tenderness.  Skin:    General: Skin is warm and dry.  Neurological:     Mental Status: She is alert.     Comments: Dysarthric speech.  She is frequently flicking the right side of her cheek with her tongue.  Difficulty ascertaining facial strength as she has difficulty following commands.  Unable to assess visual fields.  Altered sensation to light touch in the right cheek.  There is mild weakness on grip in the right arm, mild drift in the right leg.  Psychiatric:     Comments: Anxious appearing     ED Results / Procedures / Treatments   Labs (all labs ordered are listed, but only abnormal results are displayed) Labs Reviewed  CBC - Abnormal; Notable for the following components:      Result Value   WBC 10.7 (*)    RDW 15.6 (*)    All other components within normal limits   DIFFERENTIAL - Abnormal; Notable for the following components:   Abs Immature Granulocytes 0.12 (*)    All other components within normal limits  COMPREHENSIVE METABOLIC PANEL WITH GFR - Abnormal; Notable for the following components:   CO2 21 (*)    All other components within normal limits  I-STAT CHEM 8, ED - Abnormal; Notable for the following components:   BUN 5 (*)    Calcium, Ion 1.13 (*)    All other components within normal limits  ETHANOL  PROTIME-INR  APTT  HCG, SERUM, QUALITATIVE  RAPID URINE DRUG SCREEN, HOSP PERFORMED  HIV ANTIBODY (ROUTINE TESTING W REFLEX)  URINALYSIS, COMPLETE (UACMP) WITH MICROSCOPIC  BASIC METABOLIC PANEL WITH GFR  CBC  CBG MONITORING,  ED    EKG None  Radiology DG Chest Port 1 View Result Date: 04/21/2024 CLINICAL DATA:  Shortness of breath EXAM: PORTABLE CHEST 1 VIEW COMPARISON:  07/23/2023 FINDINGS: Cardiac shadows within normal limits. Lungs are well aerated bilaterally. No focal infiltrate or sizable effusion is seen. No bony abnormality is noted. IMPRESSION: No active disease. Electronically Signed   By: Violeta Grey M.D.   On: 04/21/2024 02:44   MR BRAIN WO CONTRAST Result Date: 04/21/2024 CLINICAL DATA:  Neuro deficit, acute, stroke suspected EXAM: MRI HEAD WITHOUT CONTRAST TECHNIQUE: Multiplanar, multiecho pulse sequences of the brain and surrounding structures were obtained without intravenous contrast. COMPARISON:  None Available. FINDINGS: Limited stroke protocol per the ordering provider. Only DWI, FLAIR, and susceptibility weighted imaging was performed. Within this limitation: Brain: No acute infarction, hemorrhage, hydrocephalus, extra-axial collection or mass lesion. Vascular: Limited evaluation without T2 weighted imaging. Skull and upper cervical spine: Normal marrow signal. Sinuses/Orbits: No acute findings. IMPRESSION: No evidence of acute abnormality.  Limited stroke protocol. Electronically Signed   By: Stevenson Elbe M.D.    On: 04/21/2024 00:31   CT ANGIO HEAD NECK W WO CM (CODE STROKE) Result Date: 04/21/2024 CLINICAL DATA:  Stroke, follow up EXAM: CT ANGIOGRAPHY HEAD AND NECK WITH AND WITHOUT CONTRAST TECHNIQUE: Multidetector CT imaging of the head and neck was performed using the standard protocol during bolus administration of intravenous contrast. Multiplanar CT image reconstructions and MIPs were obtained to evaluate the vascular anatomy. Carotid stenosis measurements (when applicable) are obtained utilizing NASCET criteria, using the distal internal carotid diameter as the denominator. RADIATION DOSE REDUCTION: This exam was performed according to the departmental dose-optimization program which includes automated exposure control, adjustment of the mA and/or kV according to patient size and/or use of iterative reconstruction technique. CONTRAST:  75mL OMNIPAQUE  IOHEXOL  350 MG/ML SOLN COMPARISON:  CT head from today. FINDINGS: CTA NECK FINDINGS Aortic arch: Great vessel origins are patent without significant stenosis. Right carotid system: No evidence of dissection, stenosis (50% or greater), or occlusion. Left carotid system: No evidence of dissection, stenosis (50% or greater), or occlusion. Vertebral arteries: Codominant. No evidence of dissection, stenosis (50% or greater), or occlusion. Skeleton: No acute abnormality on limited assessment. Other neck: No acute abnormality on limited assessment. Upper chest: Visualized lung apices are clear.  Emphysema. Review of the MIP images confirms the above findings CTA HEAD FINDINGS Anterior circulation: Bilateral intracranial ICAs, MCAs, and ACAs are patent without proximal hemodynamically significant stenosis. Posterior circulation: Bilateral intradural vertebral arteries, basilar artery and bilateral posterior short is are patent without proximal hemodynamically significant stenosis. Venous sinuses: As permitted by contrast timing, patent. Review of the MIP images confirms the  above findings IMPRESSION: No emergent large vessel occlusion or proximal hemodynamically significant stenosis. Electronically Signed   By: Stevenson Elbe M.D.   On: 04/21/2024 00:04   CT HEAD CODE STROKE WO CONTRAST Result Date: 04/20/2024 CLINICAL DATA:  Code stroke.  Neuro deficit, acute, stroke suspected EXAM: CT HEAD WITHOUT CONTRAST TECHNIQUE: Contiguous axial images were obtained from the base of the skull through the vertex without intravenous contrast. RADIATION DOSE REDUCTION: This exam was performed according to the departmental dose-optimization program which includes automated exposure control, adjustment of the mA and/or kV according to patient size and/or use of iterative reconstruction technique. COMPARISON:  None Available. FINDINGS: Brain: No evidence of acute infarction, hemorrhage, hydrocephalus, extra-axial collection or mass lesion/mass effect. Partially empty sella. Vascular: No hyperdense vessel identified. Skull: No acute fracture. Sinuses/Orbits: Clear sinuses.  No acute orbital findings. Other: Small mastoid effusions. ASPECTS [facility] Stroke Program Early CT Score) Total score (0-10 with 10 being normal): 10. IMPRESSION: 1. No evidence of acute intracranial abnormality. 2. ASPECTS is 10. Code stroke imaging results were communicated on 04/20/2024 at 11:57 pm to provider Dr. Bonnita Buttner via secure text paging. Electronically Signed   By: Stevenson Elbe M.D.   On: 04/20/2024 23:57    Procedures Procedures    Medications Ordered in ED Medications  fluticasone furoate-vilanterol (BREO ELLIPTA) 200-25 MCG/ACT 1 puff (has no administration in time range)  albuterol  (PROVENTIL ) (2.5 MG/3ML) 0.083% nebulizer solution 2.5 mg (has no administration in time range)  enoxaparin (LOVENOX) injection 40 mg (has no administration in time range)  sodium chloride  flush (NS) 0.9 % injection 3 mL (3 mLs Intravenous Not Given 04/21/24 0223)  acetaminophen  (TYLENOL ) tablet 650 mg (has no  administration in time range)    Or  acetaminophen  (TYLENOL ) suppository 650 mg (has no administration in time range)  bisacodyl (DULCOLAX) suppository 10 mg (has no administration in time range)  ondansetron  (ZOFRAN ) tablet 4 mg (has no administration in time range)    Or  ondansetron  (ZOFRAN ) injection 4 mg (has no administration in time range)  umeclidinium bromide (INCRUSE ELLIPTA) 62.5 MCG/ACT 1 puff (has no administration in time range)  iohexol  (OMNIPAQUE ) 350 MG/ML injection 75 mL (75 mLs Intravenous Contrast Given 04/20/24 2348)  LORazepam (ATIVAN) injection 2 mg (2 mg Intravenous Given 04/21/24 0135)    ED Course/ Medical Decision Making/ A&P                                 Medical Decision Making Amount and/or Complexity of Data Reviewed Labs: ordered. Radiology: ordered.  Risk Decision regarding hospitalization.   Patient with history of COPD, chronic back pain here for evaluation as a code stroke.  She was evaluated by neurology at time of ED arrival.  CT head, CTA is negative for acute abnormality.  She was treated with a dose of lorazepam for possible focal seizure activity given her symptoms.  She did fail her swallow screen.  Plan to admit for ongoing observation and management.  Patient and family at bedside updated [facility] Cr[facility] studies and they are in agreement with admission for ongoing care.  Labs without significant electrolyte abnormality.  No evidence of acute infectious process at this time.        Final Clinical Impression(s) / ED Diagnoses Final diagnoses:  Altered mental status, unspecified altered mental status type    Rx / DC Orders ED Discharge Orders     None         Kelsey Patricia, MD 04/21/24 [phone]

## 2024-04-20 NOTE — ED Triage Notes (Signed)
 Patient arrived with EMS from home as a Code Stroke activation , LSW 9:30 PM this evening , reports sudden onset right side head pressure "pop" , decrease sensation /numbness at right side of body /weakness.

## 2024-04-21 ENCOUNTER — Ambulatory Visit (HOSPITAL_COMMUNITY)

## 2024-04-21 ENCOUNTER — Encounter (HOSPITAL_COMMUNITY): Payer: Self-pay | Admitting: Family Medicine

## 2024-04-21 ENCOUNTER — Observation Stay (HOSPITAL_COMMUNITY)

## 2024-04-21 ENCOUNTER — Emergency Department (HOSPITAL_COMMUNITY)

## 2024-04-21 DIAGNOSIS — G9340 Encephalopathy, unspecified: Secondary | ICD-10-CM | POA: Diagnosis present

## 2024-04-21 DIAGNOSIS — F411 Generalized anxiety disorder: Secondary | ICD-10-CM

## 2024-04-21 DIAGNOSIS — G8929 Other chronic pain: Secondary | ICD-10-CM | POA: Diagnosis present

## 2024-04-21 DIAGNOSIS — R569 Unspecified convulsions: Secondary | ICD-10-CM

## 2024-04-21 DIAGNOSIS — G4730 Sleep apnea, unspecified: Secondary | ICD-10-CM

## 2024-04-21 DIAGNOSIS — J449 Chronic obstructive pulmonary disease, unspecified: Secondary | ICD-10-CM | POA: Diagnosis not present

## 2024-04-21 DIAGNOSIS — G894 Chronic pain syndrome: Secondary | ICD-10-CM | POA: Diagnosis not present

## 2024-04-21 DIAGNOSIS — R29818 Other symptoms and signs involving the nervous system: Secondary | ICD-10-CM

## 2024-04-21 DIAGNOSIS — J9611 Chronic respiratory failure with hypoxia: Secondary | ICD-10-CM | POA: Diagnosis not present

## 2024-04-21 LAB — COMPREHENSIVE METABOLIC PANEL WITH GFR
ALT: 35 U/L (ref 0–44)
AST: 35 U/L (ref 15–41)
Albumin: 3.5 g/dL (ref 3.5–5.0)
Alkaline Phosphatase: 89 U/L (ref 38–126)
Anion gap: 12 (ref 5–15)
BUN: 6 mg/dL (ref 6–20)
CO2: 21 mmol/L — ABNORMAL LOW (ref 22–32)
Calcium: 9.1 mg/dL (ref 8.9–10.3)
Chloride: 104 mmol/L (ref 98–111)
Creatinine, Ser: 0.84 mg/dL (ref 0.44–1.00)
GFR, Estimated: >60 mL/min (ref >60)
Glucose, Bld: 91 mg/dL (ref 70–99)
Potassium: 3.9 mmol/L (ref 3.5–5.1)
Sodium: 137 mmol/L (ref 135–145)
Total Bilirubin: 0.8 mg/dL (ref 0.0–1.2)
Total Protein: 7.5 g/dL (ref 6.5–8.1)

## 2024-04-21 LAB — BASIC METABOLIC PANEL WITH GFR
Anion gap: 8 (ref 5–15)
BUN: <5 mg/dL — ABNORMAL LOW (ref 6–20)
CO2: 22 mmol/L (ref 22–32)
Calcium: 8.4 mg/dL — ABNORMAL LOW (ref 8.9–10.3)
Chloride: 108 mmol/L (ref 98–111)
Creatinine, Ser: 0.81 mg/dL (ref 0.44–1.00)
GFR, Estimated: >60 mL/min (ref >60)
Glucose, Bld: 105 mg/dL — ABNORMAL HIGH (ref 70–99)
Potassium: 3.7 mmol/L (ref 3.5–5.1)
Sodium: 138 mmol/L (ref 135–145)

## 2024-04-21 LAB — URINALYSIS, COMPLETE (UACMP) WITH MICROSCOPIC
Bacteria, UA: NONE SEEN
Bilirubin Urine: NEGATIVE
Glucose, UA: NEGATIVE mg/dL
Hgb urine dipstick: NEGATIVE
Ketones, ur: NEGATIVE mg/dL
Leukocytes,Ua: NEGATIVE
Nitrite: NEGATIVE
Protein, ur: NEGATIVE mg/dL
Specific Gravity, Urine: 1.02 (ref 1.005–1.030)
pH: 6 (ref 5.0–8.0)

## 2024-04-21 LAB — CBC
HCT: 39.9 % (ref 36.0–46.0)
Hemoglobin: 12.9 g/dL (ref 12.0–15.0)
MCH: 30.4 pg (ref 26.0–34.0)
MCHC: 32.3 g/dL (ref 30.0–36.0)
MCV: 94.1 fL (ref 80.0–100.0)
Platelets: 247 10*3/uL (ref 150–400)
RBC: 4.24 MIL/uL (ref 3.87–5.11)
RDW: 15.6 % — ABNORMAL HIGH (ref 11.5–15.5)
WBC: 8.3 10*3/uL (ref 4.0–10.5)
nRBC: 0 % (ref 0.0–0.2)

## 2024-04-21 LAB — HCG, SERUM, QUALITATIVE: Preg, Serum: NEGATIVE

## 2024-04-21 LAB — HIV ANTIBODY (ROUTINE TESTING W REFLEX): HIV Screen 4th Generation wRfx: NONREACTIVE

## 2024-04-21 MED ORDER — ONDANSETRON HCL 4 MG PO TABS: 4.0000 mg | ORAL_TABLET | Freq: Four times a day (QID) | ORAL | Status: DC | PRN

## 2024-04-21 MED ORDER — ACETAMINOPHEN 325 MG PO TABS: 650.0000 mg | ORAL_TABLET | Freq: Four times a day (QID) | ORAL | Status: DC | PRN

## 2024-04-21 MED ORDER — UMECLIDINIUM BROMIDE 62.5 MCG/ACT IN AEPB
1.0000 | INHALATION_SPRAY | Freq: Every day | RESPIRATORY_TRACT | Status: DC
  Administered 2024-04-21: 1 via RESPIRATORY_TRACT
  Filled 2024-04-21: qty 7

## 2024-04-21 MED ORDER — LORAZEPAM 2 MG/ML IJ SOLN
2.0000 mg | Freq: Once | INTRAMUSCULAR | Status: AC
  Administered 2024-04-21: 2 mg via INTRAVENOUS

## 2024-04-21 MED ORDER — TIOTROPIUM BROMIDE MONOHYDRATE 18 MCG IN CAPS: 18.0000 ug | ORAL_CAPSULE | Freq: Every day | RESPIRATORY_TRACT | Status: DC

## 2024-04-21 MED ORDER — BISACODYL 10 MG RE SUPP: 10.0000 mg | Freq: Every day | RECTAL | Status: DC | PRN

## 2024-04-21 MED ORDER — ONDANSETRON HCL 4 MG/2ML IJ SOLN: 4.0000 mg | Freq: Four times a day (QID) | INTRAMUSCULAR | Status: DC | PRN

## 2024-04-21 MED ORDER — SODIUM CHLORIDE 0.9% FLUSH
3.0000 mL | Freq: Two times a day (BID) | INTRAVENOUS | Status: DC
  Administered 2024-04-21: 3 mL via INTRAVENOUS

## 2024-04-21 MED ORDER — NICOTINE 21 MG/24HR TD PT24
21.0000 mg | MEDICATED_PATCH | Freq: Every day | TRANSDERMAL | Status: DC
  Administered 2024-04-21: 21 mg via TRANSDERMAL
  Filled 2024-04-21: qty 1

## 2024-04-21 MED ORDER — ACETAMINOPHEN 10 MG/ML IV SOLN: 1000.0000 mg | Freq: Four times a day (QID) | INTRAVENOUS | Status: DC

## 2024-04-21 MED ORDER — KETOROLAC TROMETHAMINE 15 MG/ML IJ SOLN: 15.0000 mg | Freq: Four times a day (QID) | INTRAMUSCULAR | Status: DC

## 2024-04-21 MED ORDER — FLUTICASONE FUROATE-VILANTEROL 200-25 MCG/ACT IN AEPB
1.0000 | INHALATION_SPRAY | Freq: Every day | RESPIRATORY_TRACT | Status: DC
  Administered 2024-04-21: 1 via RESPIRATORY_TRACT
  Filled 2024-04-21: qty 28

## 2024-04-21 MED ORDER — LACTATED RINGERS IV SOLN: INTRAVENOUS | Status: DC

## 2024-04-21 MED ORDER — KETOROLAC TROMETHAMINE 15 MG/ML IJ SOLN
15.0000 mg | Freq: Once | INTRAMUSCULAR | Status: AC
  Administered 2024-04-21: 15 mg via INTRAVENOUS
  Filled 2024-04-21: qty 1

## 2024-04-21 MED ORDER — ALBUTEROL SULFATE (2.5 MG/3ML) 0.083% IN NEBU: 2.5000 mg | INHALATION_SOLUTION | RESPIRATORY_TRACT | Status: DC | PRN

## 2024-04-21 MED ORDER — LORAZEPAM 2 MG/ML IJ SOLN
INTRAMUSCULAR | Status: AC
Start: 2024-04-21 — End: 2024-04-21
  Filled 2024-04-21: qty 1

## 2024-04-21 MED ORDER — ACETAMINOPHEN 650 MG RE SUPP: 650.0000 mg | Freq: Four times a day (QID) | RECTAL | Status: DC | PRN

## 2024-04-21 MED ORDER — ENOXAPARIN SODIUM 40 MG/0.4ML IJ SOSY
40.0000 mg | PREFILLED_SYRINGE | INTRAMUSCULAR | Status: DC
  Administered 2024-04-21: 40 mg via SUBCUTANEOUS
  Filled 2024-04-21: qty 0.4

## 2024-04-21 NOTE — H&P (Signed)
 History and Physical    Lindsay Harris NWG:[id number] DOB: Oct 19, 1976 DOA: 04/20/2024  PCP: Macie Saxon, MD   Patient coming from: Home   Chief Complaint: Unresponsive episode, shaking, right side numbness, dysarthria    HPI: Lindsay Harris is a 48 y.o. female with medical history significant for asthma/COPD overlap, OSA on CPAP, chronic hypoxic respiratory failure, chronic pain, and anxiety, now presenting with right-sided numbness and dysarthria after an episode of unresponsiveness and shaking.  Patient's spouse noted the patient was making odd movements with her tongue yesterday morning, and also seem to have an odd breathing pattern at times.  She was able to continue about her usual activities such as crocheting a blanket, but later was noted to have generalized shaking witnessed by her daughter, would not follow commands, and would not respond verbally, and EMS was called.  She has been confused and lethargic since then, continued to exhibit automatisms involving her tongue, and has been asking where she is.  ED Course: Upon arrival to the ED, patient is found to be afebrile and saturating well on 3 L/min of supplemental oxygen with normal heart rate and stable BP.  Labs are most notable for normal creatinine, WBC 10,700, and normal hemoglobin.  There are no acute findings on MRI brain or CTA head and neck.  Patient was evaluated by neurology in the emergency department and given 2 mg Ativan.  She failed swallow screen in the ED.  Review of Systems:  ROS limited by patient's clinical condition.  Past Medical History:  Diagnosis Date   Asthma    Dyspnea    GERD (gastroesophageal reflux disease)    Headache    migraines    Past Surgical History:  Procedure Laterality Date   COLONOSCOPY WITH PROPOFOL  N/A 08/09/2023   Procedure: COLONOSCOPY WITH BIOPSY;  Surgeon: Selena Daily, MD;  Location: [facility];  Service: Endoscopy;  Laterality: N/A;   POLYPECTOMY N/A  08/09/2023   Procedure: POLYPECTOMY;  Surgeon: Selena Daily, MD;  Location: [facility];  Service: Endoscopy;  Laterality: N/A;    Social History:   reports that she has been smoking cigarettes. She started smoking about 38 years ago. She has a 38.3 pack-year smoking history. She does not have any smokeless tobacco history on file. She reports that she does not drink alcohol and does not use drugs.  Allergies  Allergen Reactions   Amoxicillin Anaphylaxis and Hives   Morphine  Hives, Itching, Nausea Only and Shortness Of Breath    Family History  Problem Relation Age of Onset   Colon cancer Mother 46   Ovarian cancer Sister 75       Stage 3   Breast cancer Maternal Grandmother        twice: early 20s and mid 5s   Colon cancer Maternal Uncle        early 30s     Prior to Admission medications   Medication Sig Start Date End Date Taking? Authorizing Provider  albuterol  (PROVENTIL ) (2.5 MG/3ML) 0.083% nebulizer solution Take 2.5 mg by nebulization every 4 (four) hours as needed. 06/18/23   [provider]  albuterol  (VENTOLIN  HFA) 108 (90 Base) MCG/ACT inhaler Inhale 2 puffs into the lungs every 6 (six) hours as needed. 11/22/23   Assaker, Marianne Shirts, MD  budesonide -formoterol  (SYMBICORT ) 160-4.5 MCG/ACT inhaler Inhale 2 puffs into the lungs in the morning and at bedtime. 08/06/23   Assaker, Marianne Shirts, MD  cyclobenzaprine (FLEXERIL) 5 MG tablet Take 5-10 mg by  mouth every 8 (eight) hours as needed. 11/27/23   [provider]  DULoxetine (CYMBALTA) 60 MG capsule Take 60 mg by mouth daily. 08/31/23   [provider]  Elagolix Sodium  (ORILISSA ) 200 MG TABS Take 1 tablet (200 mg total) by mouth 2 (two) times daily. 07/30/23   Ana Balling, MD  Ensifentrine  (OHTUVAYRE ) 3 MG/2.5ML SUSP Inhale 3 mg into the lungs in the morning and at bedtime. 02/07/24   Assaker, Marianne Shirts, MD  EPINEPHrine 0.3 mg/0.3 mL IJ SOAJ injection Inject 0.3 mg into the muscle as  needed for anaphylaxis. 05/29/23   [provider]  eszopiclone (LUNESTA) 1 MG TABS tablet Take 1 mg by mouth at bedtime as needed. 12/03/23   [provider]  furosemide (LASIX) 20 MG tablet Take 20 mg by mouth daily as needed. 11/27/23   [provider]  gabapentin (NEURONTIN) 100 MG capsule Take 100 mg by mouth. 01/21/24   [provider]  lactulose  (CHRONULAC ) 10 GM/15ML solution Take 30 mLs (20 g total) by mouth daily. 01/29/24   Selena Daily, MD  linaclotide  (LINZESS ) 290 MCG CAPS capsule TAKE 1 CAPSULE BY MOUTH DAILY BEFORE BREAKFAST. 09/26/23   Vanga, Rohini Reddy, MD  methocarbamol (ROBAXIN) 500 MG tablet Take 500 mg by mouth every 8 (eight) hours as needed for muscle spasms. 01/21/24   [provider]  montelukast (SINGULAIR) 10 MG tablet Take 10 mg by mouth at bedtime. 05/03/23   [provider]  ondansetron  (ZOFRAN ) 4 mg TABS tablet Take by mouth every 8 (eight) hours as needed. 05/03/23   [provider]  pantoprazole  (PROTONIX ) 40 MG tablet Take 1 tablet (40 mg total) by mouth 2 (two) times daily before a meal. 12/27/23   Vanga, Elson Halon, MD  propranolol ER (INDERAL LA) 80 MG 24 hr capsule Take 80 mg by mouth daily. 05/28/23   [provider]  rosuvastatin (CRESTOR) 20 MG tablet Take 20 mg by mouth daily. 10/05/23   [provider]  Spacer/Aero-Holding Idelle Majors (AEROCHAMBER MV) inhaler Use as instructed 02/07/24   Assaker, Marianne Shirts, MD  SPIRIVA  HANDIHALER 18 MCG inhalation capsule Place 1 capsule (18 mcg total) into inhaler and inhale daily. 11/22/23   Assaker, Marianne Shirts, MD  SUMAtriptan (IMITREX) 25 MG tablet Take 25 mg by mouth every 2 (two) hours as needed. 05/28/23   [provider]  triamcinolone cream (KENALOG) 0.1 % Apply topically 2 (two) times daily. 05/30/23   [provider]    Physical Exam: Vitals:   04/21/24 0030 04/21/24 0045 04/21/24 0115 04/21/24 0145  BP: 124/73  137/77 115/78 105/75  Pulse: 92 91 91 94  Resp: 19 15 19 14   Temp:      TempSrc:      SpO2: 100% 100% 100% 99%  Weight:        Constitutional: NAD, no pallor or diaphoresis  Eyes: PERTLA, lids and conjunctivae normal ENMT: Mucous membranes are moist. Posterior pharynx clear of any exudate or lesions.   Neck: supple, no masses  Respiratory: no wheezing, no crackles. No accessory muscle use.  Cardiovascular: S1 & S2 heard, regular rate and rhythm. No extremity edema.   Abdomen: No distension, no tenderness, soft. Bowel sounds active.  Musculoskeletal: no clubbing / cyanosis. No joint deformity upper and lower extremities.   Skin: no significant rashes, lesions, ulcers. Warm, dry, well-perfused. Neurologic: CN 2-12 grossly intact. Sleeping, wakes to voice, makes eye-contact, and answers 1-2 questions before returning to sleep. Moving all extremities.  Labs and Imaging on Admission: I have personally reviewed following labs and imaging studies  CBC: Recent Labs  Lab 04/20/24 2335 04/20/24 2337  WBC 10.7*  --   NEUTROABS 5.8  --   HGB 14.1 14.6  HCT 42.6 43.0  MCV 91.8  --   PLT 233  --    Basic Metabolic Panel: Recent Labs  Lab 04/20/24 2335 04/20/24 2337  NA 137 139  K 3.9 4.0  CL 104 101  CO2 21*  --   GLUCOSE 91 93  BUN 6 5*  CREATININE 0.84 0.80  CALCIUM 9.1  --    GFR: Estimated Creatinine Clearance: 106.4 mL/min (by C-G formula based on SCr of 0.8 mg/dL). Liver Function Tests: Recent Labs  Lab 04/20/24 2335  AST 35  ALT 35  ALKPHOS 89  BILITOT 0.8  PROT 7.5  ALBUMIN 3.5   No results for input(s): "LIPASE", "AMYLASE" in the last 168 hours. No results for input(s): "AMMONIA" in the last 168 hours. Coagulation Profile: Recent Labs  Lab 04/20/24 2335  INR 0.9   Cardiac Enzymes: No results for input(s): "CKTOTAL", "CKMB", "CKMBINDEX", "TROPONINI" in the last 168 hours. BNP (last 3 results) No results for input(s): "PROBNP" in the last 8760  hours. HbA1C: No results for input(s): "HGBA1C" in the last 72 hours. CBG: Recent Labs  Lab 04/20/24 2330  GLUCAP 94   Lipid Profile: No results for input(s): "CHOL", "HDL", "LDLCALC", "TRIG", "CHOLHDL", "LDLDIRECT" in the last 72 hours. Thyroid Function Tests: No results for input(s): "TSH", "T4TOTAL", "FREET4", "T3FREE", "THYROIDAB" in the last 72 hours. Anemia Panel: No results for input(s): "VITAMINB12", "FOLATE", "FERRITIN", "TIBC", "IRON", "RETICCTPCT" in the last 72 hours. Urine analysis:    Component Value Date/Time   COLORURINE YELLOW (A) 06/07/2023 1437   APPEARANCEUR HAZY (A) 06/07/2023 1437   LABSPEC 1.018 06/07/2023 1437   PHURINE 6.0 06/07/2023 1437   GLUCOSEU NEGATIVE 06/07/2023 1437   HGBUR NEGATIVE 06/07/2023 1437   BILIRUBINUR NEGATIVE 06/07/2023 1437   KETONESUR NEGATIVE 06/07/2023 1437   PROTEINUR NEGATIVE 06/07/2023 1437   UROBILINOGEN 0.2 09/19/2018 1021   NITRITE NEGATIVE 06/07/2023 1437   LEUKOCYTESUR NEGATIVE 06/07/2023 1437   Sepsis Labs: @LABRCNTIP (procalcitonin:4,lacticidven:4) )No results found for this or any previous visit (from the past 240 hours).   Radiological Exams on Admission: MR BRAIN WO CONTRAST Result Date: 04/21/2024 CLINICAL DATA:  Neuro deficit, acute, stroke suspected EXAM: MRI HEAD WITHOUT CONTRAST TECHNIQUE: Multiplanar, multiecho pulse sequences of the brain and surrounding structures were obtained without intravenous contrast. COMPARISON:  None Available. FINDINGS: Limited stroke protocol per the ordering provider. Only DWI, FLAIR, and susceptibility weighted imaging was performed. Within this limitation: Brain: No acute infarction, hemorrhage, hydrocephalus, extra-axial collection or mass lesion. Vascular: Limited evaluation without T2 weighted imaging. Skull and upper cervical spine: Normal marrow signal. Sinuses/Orbits: No acute findings. IMPRESSION: No evidence of acute abnormality.  Limited stroke protocol. Electronically  Signed   By: Stevenson Elbe M.D.   On: 04/21/2024 00:31   CT ANGIO HEAD NECK W WO CM (CODE STROKE) Result Date: 04/21/2024 CLINICAL DATA:  Stroke, follow up EXAM: CT ANGIOGRAPHY HEAD AND NECK WITH AND WITHOUT CONTRAST TECHNIQUE: Multidetector CT imaging of the head and neck was performed using the standard protocol during bolus administration of intravenous contrast. Multiplanar CT image reconstructions and MIPs were obtained to evaluate the vascular anatomy. Carotid stenosis measurements (when applicable) are obtained utilizing NASCET criteria, using the distal internal carotid diameter as the denominator. RADIATION DOSE REDUCTION: This exam was  performed according to the departmental dose-optimization program which includes automated exposure control, adjustment of the mA and/or kV according to patient size and/or use of iterative reconstruction technique. CONTRAST:  75mL OMNIPAQUE  IOHEXOL  350 MG/ML SOLN COMPARISON:  CT head from today. FINDINGS: CTA NECK FINDINGS Aortic arch: Great vessel origins are patent without significant stenosis. Right carotid system: No evidence of dissection, stenosis (50% or greater), or occlusion. Left carotid system: No evidence of dissection, stenosis (50% or greater), or occlusion. Vertebral arteries: Codominant. No evidence of dissection, stenosis (50% or greater), or occlusion. Skeleton: No acute abnormality on limited assessment. Other neck: No acute abnormality on limited assessment. Upper chest: Visualized lung apices are clear.  Emphysema. Review of the MIP images confirms the above findings CTA HEAD FINDINGS Anterior circulation: Bilateral intracranial ICAs, MCAs, and ACAs are patent without proximal hemodynamically significant stenosis. Posterior circulation: Bilateral intradural vertebral arteries, basilar artery and bilateral posterior short is are patent without proximal hemodynamically significant stenosis. Venous sinuses: As permitted by contrast timing, patent.  Review of the MIP images confirms the above findings IMPRESSION: No emergent large vessel occlusion or proximal hemodynamically significant stenosis. Electronically Signed   By: Stevenson Elbe M.D.   On: 04/21/2024 00:04   CT HEAD CODE STROKE WO CONTRAST Result Date: 04/20/2024 CLINICAL DATA:  Code stroke.  Neuro deficit, acute, stroke suspected EXAM: CT HEAD WITHOUT CONTRAST TECHNIQUE: Contiguous axial images were obtained from the base of the skull through the vertex without intravenous contrast. RADIATION DOSE REDUCTION: This exam was performed according to the departmental dose-optimization program which includes automated exposure control, adjustment of the mA and/or kV according to patient size and/or use of iterative reconstruction technique. COMPARISON:  None Available. FINDINGS: Brain: No evidence of acute infarction, hemorrhage, hydrocephalus, extra-axial collection or mass lesion/mass effect. Partially empty sella. Vascular: No hyperdense vessel identified. Skull: No acute fracture. Sinuses/Orbits: Clear sinuses.  No acute orbital findings. Other: Small mastoid effusions. ASPECTS [facility] Stroke Program Early CT Score) Total score (0-10 with 10 being normal): 10. IMPRESSION: 1. No evidence of acute intracranial abnormality. 2. ASPECTS is 10. Code stroke imaging results were communicated on 04/20/2024 at 11:57 pm to provider Dr. Bonnita Buttner via secure text paging. Electronically Signed   By: Stevenson Elbe M.D.   On: 04/20/2024 23:57    EKG: Independently reviewed. Sinus tachycardia, rate 121.   Assessment/Plan   1. Acute encephalopathy  - History is suspicious for seizure with postictal state; polypharmacy also considered  - Continue neuro checks, check UA, tox screen, CXR, and EEG, hold sedating medications     2. Asthma/COPD; OSA; chronic hypoxic respiratory failure  - Not in exacerbation   - Continue ICS-LABA, LAMA, as-needed albuterol , supplemental O2, and CPAP while sleeping   3.  Chronic pain  - Hold sedating medications for now  4. Anxiety  - Hold sedating medications for now     DVT prophylaxis: Lovenox  Code Status: Full  Level of Care: Level of care: Telemetry Medical Family Communication: Spouse and daughter at bedside   Disposition Plan:  Patient is from: home  Anticipated d/c is to: TBD Anticipated d/c date is: 04/22/24  Patient currently: Pending UA, CXR, UDS, SLP eval Consults called: Neurology  Admission status: Observation     Walton Guppy, MD [facility]  04/21/2024, 2:05 AM

## 2024-04-21 NOTE — Progress Notes (Signed)
Routine EEG complete. Results pending.

## 2024-04-21 NOTE — ED Notes (Signed)
 Patient transported for swallow test

## 2024-04-21 NOTE — ED Notes (Signed)
 Returned from MRI

## 2024-04-21 NOTE — Procedures (Signed)
 Patient Name: Lindsay Harris  MRN: [id number]  Epilepsy Attending: Arleene Lack  Referring Physician/Provider: Walton Guppy, MD  Date: 04/21/2024 Duration: 22.13 mins   Patient history:  48 y.o. female presenting with right-sided numbness and dysarthria after an episode of unresponsiveness and shaking. EEG to evaluate for seizure  Level of alertness: Awake, asleep  AEDs during EEG study: Ativan  Technical aspects: This EEG study was done with scalp electrodes positioned according to the 10-20 International system of electrode placement. Electrical activity was reviewed with band pass filter of 1-70Hz , sensitivity of 7 uV/mm, display speed of 67mm/sec with a 60Hz  notched filter applied as appropriate. EEG data were recorded continuously and digitally stored.  Video monitoring was available and reviewed as appropriate.  Description: The posterior dominant rhythm consists of 9 Hz activity of moderate voltage (25-35 uV) seen predominantly in posterior head regions, symmetric and reactive to eye opening and eye closing. Sleep was characterized by vertex waves, sleep spindles (12 to 14 Hz), maximal frontocentral region. Hyperventilation and photic stimulation were not performed.     IMPRESSION: This study is within normal limits. No seizures or epileptiform discharges were seen throughout the recording.  A normal interictal EEG does not exclude the diagnosis of epilepsy.  Keerthi Hazell O Rayven Rettig

## 2024-04-21 NOTE — ED Notes (Signed)
 Dr. Antionette Char at bedside

## 2024-04-21 NOTE — Plan of Care (Signed)
 PLAN OF CARE NOTE  48 y.o. female with medical history significant for asthma/COPD overlap, OSA on CPAP, chronic hypoxic respiratory failure, chronic pain, and anxiety, now presenting with right-sided numbness and dysarthria after an episode of unresponsiveness and shaking.   There was initial concern for stroke versus seizure.  Neurology was consulted.  The patient had an MRI and CT angio head and neck and also underwent EEG.  No significant findings.  Patient complains of recurrent episodes of dysphagia.  She also complains of uncontrolled back pain which does not respond to her muscle relaxants. She also has painful generalized cramping.   She says of late her right leg has been giving out from under her.  Noted that patient has been seen by neurology and expressed all these problems to them.  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  -Will follow-up on SLP evaluation. - Will treat pain with IV Tylenol  and Toradol  for now. - Continue IV fluids until patient is able to take p.o.  MDALA-GAUSI, Tyreck Bell AGATHA 04/21/24 2:56 PM

## 2024-04-21 NOTE — Evaluation (Signed)
 Clinical/Bedside Swallow Evaluation Patient Details  Name: Lindsay Harris MRN: [id number] Date of Birth: [birth year]  Today's Date: 04/21/2024 Time: SLP Start Time (ACUTE ONLY): 1018 SLP Stop Time (ACUTE ONLY): 1037 SLP Time Calculation (min) (ACUTE ONLY): 19 min  Past Medical History:  Past Medical History:  Diagnosis Date   Asthma    Dyspnea    GERD (gastroesophageal reflux disease)    Headache    migraines   Past Surgical History:  Past Surgical History:  Procedure Laterality Date   COLONOSCOPY WITH PROPOFOL  N/A 08/09/2023   Procedure: COLONOSCOPY WITH BIOPSY;  Surgeon: Selena Daily, MD;  Location: [facility];  Service: Endoscopy;  Laterality: N/A;   POLYPECTOMY N/A 08/09/2023   Procedure: POLYPECTOMY;  Surgeon: Selena Daily, MD;  Location: [facility];  Service: Endoscopy;  Laterality: N/A;   HPI:  Lindsay Harris is a 48 yo female presenting to ED 5/4 with dysarthria and R sided numbness after an episode of unresponsiveness and shaking. Admitted with acute encephalopathy concerning for seizure with postictal state. MRI negative. PMH includes asthma, dyspnea, GERD, migraines    Assessment / Plan / Recommendation  Clinical Impression  Pt reports frequent coughing and globus sensation with all consistencies. She states she rarely experiences hunger and when she does eat (usually at dinner time) she experiences early satiety. She is edentulous and although oral motor exam revealed no focal weakness, note R lingual deviation. She reports the anterior dorsum of her tongue is numb, prompting oral holding and delayed swallow initiation. She demonstrates explosive coughing both before and after the swallow, which is followed by multiple subswallows. While there is a suspsected esophageal component to pt's presentation, she is currently exhibiting signs of oropharyngeal dysphagia as well. Recommend she remain NPO except meds pending completion of an MBS, which is  tentatively scheduled to occur later this date. SLP Visit Diagnosis: Dysphagia, unspecified (R13.10)    Aspiration Risk  Mild aspiration risk    Diet Recommendation NPO except meds;Ice chips PRN after oral care    Medication Administration: Crushed with puree    Other  Recommendations Oral Care Recommendations: Oral care QID;Oral care prior to ice chip/H20    Recommendations for follow up therapy are one component of a multi-disciplinary discharge planning process, led by the attending physician.  Recommendations may be updated based on patient status, additional functional criteria and insurance authorization.  Follow up Recommendations Other (comment) (TBA)      Assistance Recommended at Discharge    Functional Status Assessment Patient has had a recent decline in their functional status and demonstrates the ability to make significant improvements in function in a reasonable and predictable amount of time.  Frequency and Duration min 2x/week  2 weeks       Prognosis Prognosis for improved oropharyngeal function: Good Barriers to Reach Goals: Time post onset      Swallow Study   General HPI: Lindsay Harris is a 48 yo female presenting to ED 5/4 with dysarthria and R sided numbness after an episode of unresponsiveness and shaking. Admitted with acute encephalopathy concerning for seizure with postictal state. MRI negative. PMH includes asthma, dyspnea, GERD, migraines Type of Study: Bedside Swallow Evaluation Previous Swallow Assessment: none in chart Diet Prior to this Study: NPO Temperature Spikes Noted: No Respiratory Status: Nasal cannula History of Recent Intubation: No Behavior/Cognition: Alert;Cooperative Oral Cavity Assessment: Within Functional Limits Oral Care Completed by SLP: No Oral Cavity - Dentition: Edentulous;Dentures, not available Vision: Functional for self-feeding  Self-Feeding Abilities: Able to feed self Patient Positioning: Upright in  bed Baseline Vocal Quality: Normal Volitional Cough: Strong Volitional Swallow: Able to elicit    Oral/Motor/Sensory Function Overall Oral Motor/Sensory Function: Within functional limits (R lingual deviation)   Ice Chips Ice chips: Not tested   Thin Liquid Thin Liquid: Impaired Presentation: Straw;Self Fed Pharyngeal  Phase Impairments: Suspected delayed Swallow;Multiple swallows;Wet Vocal Quality;Cough - Immediate    Nectar Thick Nectar Thick Liquid: Not tested   Honey Thick Honey Thick Liquid: Not tested   Puree Puree: Impaired Presentation: Spoon Oral Phase Functional Implications: Oral holding Pharyngeal Phase Impairments: Suspected delayed Swallow;Multiple swallows;Cough - Immediate   Solid     Solid: Not tested      Amil Kale, M.A., CCC-SLP Speech Language Pathology, Acute Rehabilitation Services  Secure Chat preferred [phone]  04/21/2024,11:05 AM

## 2024-04-21 NOTE — Code Documentation (Signed)
 Responded to Code Stroke called at 2314 for R sided weakness, abnormal speech, and decreased coordination, [id number]. Pt arrived at 2329, CBG-94, NIH-12, CT head negative for acute changes, CTA-no LVO, MRI-negative for stroke. TNK not given-not a stroke. Code Stroke cancelled at 0035.

## 2024-04-21 NOTE — Progress Notes (Signed)
 NEUROLOGY CONSULT FOLLOW UP NOTE   Date of service: Apr 21, 2024 Patient Name: Lindsay Harris MRN:  [id number] DOB:  1976/05/31  Interval Hx/subjective   Husband and daughter at bedside. Routine EEG negative. MRI Negative.   During exam, patient very tearful with expressed frustration and sadness about her current health situations, including chronic back pain, needing to wear oxygen around the clock at home, inability to work.   No confusion, no focal deficits. Exam limited by patient's inability to thoroughly complete the assessment, as she would begin to get frustrated and yell out, or become visibly upset and cry. Husband at bedside states that this stress has been building up over the past year with the multiple doctor visits and inconsistent care that she has received.   Patient states that her back pain started approximately 2 years ago. She states she was told she has multiple bulging discs and has severe muscle spasms on top of constant aching pain.  She was diagnosed with bilateral ovarian cysts 06/2023, with positive ROMA testing.Par-smear was recommended, but not seen in chart. She was placed on [place] 07/2023.  Patient saw gastroenterology 8/24 for follow-up due to rectal bleeding and chronic constipation, colonoscopy was recommended, placed on Linzess .  Vitals   Vitals:   04/21/24 0400 04/21/24 0500 04/21/24 0600 04/21/24 0755  BP: 98/73 99/65 112/67 (!) 104/91  Pulse: 82 81 72 81  Resp: 17 15 15  (!) 27  Temp:   98.5 F (36.9 C)   TempSrc:   Oral   SpO2: 99% 96% 98% 97%  Weight:         Body mass index is 40.98 kg/m.  Physical Exam   Constitutional: Appears well-developed and well-nourished.  Psych: Labile mood. Increased frustration and inability to control outbursts of tears.  Eyes: No scleral injection.  HENT: No OP obstrucion.  Head: Normocephalic.  Cardiovascular: Normal rate and regular rhythm.  Respiratory: 3L [place] (chronic use), irregular breathing GI:  Soft.  No distension. There is no tenderness.  Skin: WDI.   Neurologic Examination   Neuro: Mental Status: Patient is awake, alert, oriented to person, place, month, year, and situation. Patient is able to give a clear and coherent history. No signs of aphasia or neglect. Intermittent mild dysarthria, increased during specific questioning assessment but not present during frustrated outbursts.  Cranial Nerves: II: Visual Fields are full. Pupils are equal, round, and reactive to light.   III,IV, VI: EOMI without ptosis or diploplia.  V: Facial sensation is symmetric to temperature VII: Facial movement is symmetric.  VIII: hearing is intact to voice X: Uvula elevates symmetrically XI: not cooperative XII: tongue is midline without atrophy.  Motor: Tone is normal. Bulk is normal.  5/5 strength was present in all four extremities.  Sensory: Sensation is symmetric to light touch and temperature in the arms and legs. Cerebellar: FNF intact bilaterally. Fine motor movements do seem decreased on right side, patient uncooperative with full exam.    Medications  Current Facility-Administered Medications:    acetaminophen  (TYLENOL ) tablet 650 mg, 650 mg, Oral, Q6H PRN **OR** acetaminophen  (TYLENOL ) suppository 650 mg, 650 mg, Rectal, Q6H PRN, Opyd, Timothy S, MD   albuterol  (PROVENTIL ) (2.5 MG/3ML) 0.083% nebulizer solution 2.5 mg, 2.5 mg, Nebulization, Q4H PRN, Opyd, Timothy S, MD   bisacodyl (DULCOLAX) suppository 10 mg, 10 mg, Rectal, Daily PRN, Opyd, Timothy S, MD   enoxaparin (LOVENOX) injection 40 mg, 40 mg, Subcutaneous, Q24H, Opyd, Timothy S, MD   fluticasone furoate-vilanterol (BREO ELLIPTA)  200-25 MCG/ACT 1 puff, 1 puff, Inhalation, Daily, Opyd, Timothy S, MD   ondansetron  (ZOFRAN ) tablet 4 mg, 4 mg, Oral, Q6H PRN **OR** ondansetron  (ZOFRAN ) injection 4 mg, 4 mg, Intravenous, Q6H PRN, Opyd, Timothy S, MD   sodium chloride  flush (NS) 0.9 % injection 3 mL, 3 mL, Intravenous, Q12H,  Opyd, Timothy S, MD   umeclidinium bromide (INCRUSE ELLIPTA) 62.5 MCG/ACT 1 puff, 1 puff, Inhalation, Daily, Opyd, Timothy S, MD  Current Outpatient Medications:    albuterol  (PROVENTIL ) (2.5 MG/3ML) 0.083% nebulizer solution, Take 2.5 mg by nebulization every 4 (four) hours as needed., Disp: , Rfl:    albuterol  (VENTOLIN  HFA) 108 (90 Base) MCG/ACT inhaler, Inhale 2 puffs into the lungs every 6 (six) hours as needed., Disp: 3 each, Rfl: 3   budesonide -formoterol  (SYMBICORT ) 160-4.5 MCG/ACT inhaler, Inhale 2 puffs into the lungs in the morning and at bedtime., Disp: 1 each, Rfl: 12   cyclobenzaprine (FLEXERIL) 5 MG tablet, Take 5-10 mg by mouth every 8 (eight) hours as needed., Disp: , Rfl:    DULoxetine (CYMBALTA) 60 MG capsule, Take 60 mg by mouth 2 (two) times daily., Disp: , Rfl:    Elagolix Sodium  (ORILISSA ) 200 MG TABS, Take 1 tablet (200 mg total) by mouth 2 (two) times daily., Disp: 60 tablet, Rfl: 6   EPINEPHrine 0.3 mg/0.3 mL IJ SOAJ injection, Inject 0.3 mg into the muscle as needed for anaphylaxis., Disp: , Rfl:    eszopiclone (LUNESTA) 1 MG TABS tablet, Take 1 mg by mouth at bedtime as needed., Disp: , Rfl:    furosemide (LASIX) 20 MG tablet, Take 20 mg by mouth daily as needed., Disp: , Rfl:    gabapentin (NEURONTIN) 100 MG capsule, Take 400 mg by mouth 2 (two) times daily. Take 400mg  in the morning and 400mg  in the afternoon., Disp: , Rfl:    gabapentin (NEURONTIN) 600 MG tablet, Take 600 mg by mouth at bedtime., Disp: , Rfl:    lactulose  (CHRONULAC ) 10 GM/15ML solution, Take 30 mLs (20 g total) by mouth daily., Disp: 900 mL, Rfl: 3   linaclotide  (LINZESS ) 290 MCG CAPS capsule, TAKE 1 CAPSULE BY MOUTH DAILY BEFORE BREAKFAST., Disp: 90 capsule, Rfl: 0   methocarbamol (ROBAXIN) 500 MG tablet, Take 1,000 mg by mouth daily., Disp: , Rfl:    montelukast (SINGULAIR) 10 MG tablet, Take 10 mg by mouth at bedtime., Disp: , Rfl:    ondansetron  (ZOFRAN ) 4 mg TABS tablet, Take by mouth every 8  (eight) hours as needed., Disp: , Rfl:    pantoprazole  (PROTONIX ) 40 MG tablet, Take 1 tablet (40 mg total) by mouth 2 (two) times daily before a meal., Disp: 60 tablet, Rfl: 0   propranolol ER (INDERAL LA) 120 MG 24 hr capsule, Take 120 mg by mouth daily., Disp: , Rfl:    rosuvastatin (CRESTOR) 20 MG tablet, Take 20 mg by mouth daily., Disp: , Rfl:    Spacer/Aero-Holding Chambers (AEROCHAMBER MV) inhaler, Use as instructed, Disp: 1 each, Rfl: 0   SPIRIVA  HANDIHALER 18 MCG inhalation capsule, Place 1 capsule (18 mcg total) into inhaler and inhale daily., Disp: 90 capsule, Rfl: 3   SUMAtriptan (IMITREX) 25 MG tablet, Take 25 mg by mouth every 2 (two) hours as needed., Disp: , Rfl:    triamcinolone cream (KENALOG) 0.1 %, Apply topically 2 (two) times daily., Disp: , Rfl:   Labs and Diagnostic Imaging   CBC:  Recent Labs  Lab 04/20/24 2335 04/20/24 2337 04/21/24 0500  WBC 10.7*  --  8.3  NEUTROABS 5.8  --   --   HGB 14.1 14.6 12.9  HCT 42.6 43.0 39.9  MCV 91.8  --  94.1  PLT 233  --  247    Basic Metabolic Panel:  Lab Results  Component Value Date   NA 138 04/21/2024   K 3.7 04/21/2024   CO2 22 04/21/2024   GLUCOSE 105 (H) 04/21/2024   BUN <5 (L) 04/21/2024   CREATININE 0.81 04/21/2024   CALCIUM 8.4 (L) 04/21/2024   GFRNONAA >60 04/21/2024   GFRAA >60 12/30/2015   Lipid Panel:  Lab Results  Component Value Date   LDLCALC 229 (H) 09/04/2023   HgbA1c:  Lab Results  Component Value Date   HGBA1C 6.1 (H) 09/04/2023   Urine Drug Screen: No results found for: "LABOPIA", "COCAINSCRNUR", "LABBENZ", "AMPHETMU", "THCU", "LABBARB"  Alcohol Level     Component Value Date/Time   ETH <15 04/20/2024 2335   INR  Lab Results  Component Value Date   INR 0.9 04/20/2024   APTT  Lab Results  Component Value Date   APTT 28 04/20/2024   AED levels: No results found for: "PHENYTOIN", "ZONISAMIDE", "LAMOTRIGINE", "LEVETIRACETA"  CT Head without contrast(Personally  reviewed): Aspects 10.  No bleed   CT angio Head and Neck with contrast(Personally reviewed): No ELVO   MRI Brain(Personally reviewed): No acute intracranial normality.  No stroke  CXR: Negative  rEEG:  This study is within normal limits. No seizures or epileptiform discharges were seen throughout the recording.   Assessment   Lindsay Harris is a 48 y.o. female past medical history of  migraine headaches, chronic back pain, asthma on home oxygen since 08/2023 (required intubation at age 43), hypothyroidism, anxiety/depression/mood issues, smoker presenting for evaluation of sudden onset of right-sided numbness and weakness as well as slurred speech that started) 9:30 PM when she heard a pop in the back of her head.   On exam today, patient has intermittent slurred speech, back pain on movement (chronic), no confusion. She is able to move all extremities spontaneously with anti-gravity strength, with no sensory deficits noted. She failed swallow screen in ED, SLP examined, patient passed barium swallow. SLP signed off with regular diet order.    EEG shows no seizures, within normal limits. MRI negative. No further Neurological imaging indicated at this time.   Impression: FND secondary to multiple health issues and decreased coping mechanisms  Recommendations   Check UA/UDS Recommend outpatient follow-up with therapist and social worker to assist her in coping with the multiple health diagnoses and life changes that he occurred in the past year.   Neurology will sign off. Please recall if further questions/needs.  ______________________________________________________________________   Cherl Corner, NP [facility]  NEUROHOSPITALIST ADDENDUM Performed a face to face diagnostic evaluation.   I have reviewed the contents of history and physical exam as documented by PA/ARNP/Resident and agree with above documentation.  I have discussed and formulated the  above plan as documented. Edits to the note have been made as needed.  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 I do believe that this is partially from pain too. She has been chronically using a heating pad in the lowback and has a rash from prolonged heat exposure in the low midback. When formally testing her R arm and R leg, she is seen to have very litte movement. However, when she is distracted, she seems to be spontaneously  moving her R arm and R leg and tossing and turning in the bed without any issues. She is very overwhelmed and distraught when discussing her chronic medical issues. She reports trying physical therapy for back pain and medication but it has not been working. She is frustrated that her outpatient spine team has not been able to help her. She is also concerned about other medical issues and brings up BL ovarian cysts, rectal bleeding and constipation.  Husband does have good insight and understands the need to seek help to address her mental well being in addition to addressing her physical ailment. She is receptive to seeing a psychotherapist in addition to working on chronic medical issues to help her cope with her stress in a healthier way.  Her MRI Brain is negative, and thisrules out acute stroke, hemorrhage, CTA with no significant stenosis and not concerning for carotid or vertebrobasilar insufficiency, and rEEG does not demonstrate any epileptiform discharges and no obvious seizure risk factors noted on further history obtained from patient and her husband. My overall suspicion for an underlying organic etiology for her presentation is pretty low.  Neurology will signoff. Please feel free to contact us  with any questions or concerns.  Neev Mcmains, MD [facility] [phone]   If 7pm to 7am, please call on call as listed on AMION.

## 2024-04-21 NOTE — Progress Notes (Addendum)
 Modified Barium Swallow Study  Patient Details  Name: Lindsay Harris MRN: [id number] Date of Birth: [birth year]  Today's Date: 04/21/2024  Modified Barium Swallow completed.  Full report located under Chart Review in the Imaging Section.  History of Present Illness Lindsay Harris is a 48 yo female presenting to ED 5/4 with dysarthria and R sided numbness after an episode of unresponsiveness and shaking. Admitted with acute encephalopathy concerning for seizure with postictal state. MRI and EEG negative. PMH includes asthma, dyspnea, GERD, migraines   Clinical Impression Pt presents with a functional oropharyngeal swallow. Oral transit and swallow initaition appeared more prompt than during initial evaluation and she achieves complete oropharyngeal clearance. Mastication is mildly prolonged secondary to edentulism, although pt states she rarely wears her dentures when eating. There were episodes of coughing that did not represent airway invasion. Of note, there is a prominent CP bar which minimally affects bolus flow through the PES. The 13 mm barium tablet was given with thin liquids and was [facility]. Recommend initiating diet of regular solids with thin liquids. No further SLP f/u is necessary acutely, will sign off. Factors that may increase risk of adverse event in presence of aspiration Roderick Civatte & Jessy Morocco 2021):  N/A  Swallow Evaluation Recommendations Recommendations: PO diet PO Diet Recommendation: Regular;Thin liquids (Level 0) Liquid Administration via: Cup;Straw Medication Administration: Whole meds with liquid Supervision: Patient able to self-feed Swallowing strategies  : Minimize environmental distractions;Slow rate;Small bites/sips Postural changes: Position pt fully upright for meals;Stay upright 30-60 min after meals Oral care recommendations: Oral care BID (2x/day)    Amil Kale, M.A., CCC-SLP Speech Language Pathology, Acute Rehabilitation Services  Secure Chat  preferred [phone]  04/21/2024,1:16 PM

## 2024-04-21 NOTE — ED Notes (Signed)
 Urine incontinence care provided , adult diaper applied , repositioned on bed.

## 2024-04-21 NOTE — Discharge Summary (Signed)
 Physician Discharge Summary [facility] Discharge)   Patient: Lindsay Harris MRN: [id number] DOB: Sep 20, 1976  Admit date:     04/20/2024  Discharge date: {dischdate:26783}  Discharge Physician: MDALA-GAUSI, Jilda Most   PCP: Macie Saxon, MD   Recommendations at discharge:  {Tip this will not be part of the note when signed- Example include specific recommendations for outpatient follow-up, pending tests to follow-up on. (Optional):26781}  ***  Discharge Diagnoses: Principal Problem:   Acute encephalopathy Active Problems:   Generalized anxiety disorder   COPD (chronic obstructive pulmonary disease) (HCC)   Sleep apnea   Chronic respiratory failure with hypoxia (HCC)   Chronic pain  Resolved Problems:   * No resolved hospital problems. [facility] Course: No notes on file  Assessment and Plan: No notes have been filed under this hospital service. Service: Hospitalist     {Tip this will not be part of the note when signed Body mass index is 40.98 kg/m. , ,  (Optional):26781}  {(NOTE) Pain control PDMP Statment (Optional):26782} Consultants: *** Procedures performed: ***  Disposition: {Plan; Disposition:26390} Diet recommendation:  {Diet_Plan:26776} DISCHARGE MEDICATION: Allergies as of 04/21/2024       Reactions   Amoxicillin Anaphylaxis, Hives   Morphine  Hives, Itching, Nausea Only, Shortness Of Breath     Med Rec must be completed prior to using this [facility]***       Discharge Exam: Filed Weights   04/20/24 2300  Weight: 110 kg   ***  Condition at discharge: {DC Condition:26389}  The results of significant diagnostics from this hospitalization (including imaging, microbiology, ancillary and laboratory) are listed below for reference.   Imaging Studies: DG Swallowing Func-Speech Pathology Result Date: 04/21/2024 Table formatting from the original result was not included. Modified Barium Swallow Study Patient Details Name: Lindsay Harris MRN:  [id number] Date of Birth: 1975/12/19 Today's Date: 04/21/2024 HPI/PMH: HPI: Lindsay Harris is a 48 yo female presenting to ED 5/4 with dysarthria and R sided numbness after an episode of unresponsiveness and shaking. Admitted with acute encephalopathy concerning for seizure with postictal state. MRI negative. PMH includes asthma, dyspnea, GERD, migraines Clinical Impression: Pt presents with a functional oropharyngeal swallow. Oral transit and swallow initaition appeared more prompt than during initial evaluation and she achieves complete oropharyngeal clearance. Mastication is mildly prolonged secondary to edentulism, although pt states she rarely wears her dentures when eating. There were episodes of coughing that did not represent airway invasion. Of note, there is a prominent CP bar which minimally affects bolus flow through the PES. The 13 mm barium tablet was given with thin liquids and was [facility]. Recommend initiating diet of regular solids with thin liquids. No further SLP f/u is necessary acutely, will sign off. Factors that may increase risk of adverse event in presence of aspiration Lindsay Harris & Lindsay Harris 2021): No data recorded Recommendations/Plan: Swallowing Evaluation Recommendations Swallowing Evaluation Recommendations Recommendations: PO diet PO Diet Recommendation: Regular; Thin liquids (Level 0) Liquid Administration via: Cup; Straw Medication Administration: Whole meds with liquid Supervision: Patient able to self-feed Swallowing strategies  : Minimize environmental distractions; Slow rate; Small bites/sips Postural changes: Position pt fully upright for meals; Stay upright 30-60 min after meals Oral care recommendations: Oral care BID (2x/day) Treatment Plan Treatment Plan Treatment recommendations: No treatment recommended at this time Follow-up recommendations: No SLP follow up Recommendations Recommendations for follow up therapy are one component of a multi-disciplinary discharge planning process,  led by the attending physician.  Recommendations may be updated based on patient status,  additional functional criteria and insurance authorization. Assessment: Orofacial Exam: Orofacial Exam Oral Cavity: Oral Hygiene: WFL Oral Cavity - Dentition: Edentulous; Dentures, not available Orofacial Anatomy: WFL Oral Motor/Sensory Function: WFL Anatomy: Anatomy: Prominent cricopharyngeus Boluses Administered: Boluses Administered Boluses Administered: Thin liquids (Level 0); Mildly thick liquids (Level 2, nectar thick); Moderately thick liquids (Level 3, honey thick); Solid  Oral Impairment Domain: Oral Impairment Domain Lip Closure: No labial escape Tongue control during bolus hold: Cohesive bolus between tongue to palatal seal Bolus preparation/mastication: Timely and efficient chewing and mashing Bolus transport/lingual motion: Brisk tongue motion Oral residue: Complete oral clearance Location of oral residue : N/A Initiation of pharyngeal swallow : Posterior laryngeal surface of the epiglottis  Pharyngeal Impairment Domain: Pharyngeal Impairment Domain Soft palate elevation: No bolus between soft palate (SP)/pharyngeal wall (PW) Laryngeal elevation: Complete superior movement of thyroid cartilage with complete approximation of arytenoids to epiglottic petiole Anterior hyoid excursion: Complete anterior movement Epiglottic movement: Complete inversion Laryngeal vestibule closure: Complete, no air/contrast in laryngeal vestibule Pharyngeal stripping wave : Present - complete Pharyngeal contraction (A/P view only): N/A Pharyngoesophageal segment opening: Partial distention/partial duration, partial obstruction of flow Tongue base retraction: No contrast between tongue base and posterior pharyngeal wall (PPW) Pharyngeal residue: Complete pharyngeal clearance Location of pharyngeal residue: N/A  Esophageal Impairment Domain: Esophageal Impairment Domain Esophageal clearance upright position: Complete clearance, esophageal  coating Pill: Pill Consistency administered: Thin liquids (Level 0) Thin liquids (Level 0): [facility] Penetration/Aspiration Scale Score: Penetration/Aspiration Scale Score 1.  Material does not enter airway: Thin liquids (Level 0); Mildly thick liquids (Level 2, nectar thick); Moderately thick liquids (Level 3, honey thick); Puree; Solid; Pill Compensatory Strategies: Compensatory Strategies Compensatory strategies: Yes Straw: Effective Effective Straw: Thin liquid (Level 0); Mildly thick liquid (Level 2, nectar thick)   General Information: Caregiver present: No  Diet Prior to this Study: NPO   Temperature : Normal   Respiratory Status: WFL   Supplemental O2: Nasal cannula   History of Recent Intubation: No  Behavior/Cognition: Alert; Cooperative Self-Feeding Abilities: Able to self-feed Baseline vocal quality/speech: Normal Volitional Cough: Able to elicit Volitional Swallow: Able to elicit Exam Limitations: No limitations Goal Planning: Prognosis for improved oropharyngeal function: Good No data recorded No data recorded Patient/Family Stated Goal: none stated Consulted and agree with results and recommendations: Patient Pain: Pain Assessment Pain Assessment: No/denies pain End of Session: Start Time:SLP Start Time (ACUTE ONLY): 1152 Stop Time: SLP Stop Time (ACUTE ONLY): 1211 Time Calculation:SLP Time Calculation (min) (ACUTE ONLY): 19 min Charges: SLP Evaluations $ SLP Speech Visit: 1 Visit SLP Evaluations $BSS Swallow: 1 Procedure $MBS Swallow: 1 Procedure SLP visit diagnosis: SLP Visit Diagnosis: Dysphagia, oropharyngeal phase (R13.12) Past Medical History: Past Medical History: Diagnosis Date  Asthma   Dyspnea   GERD (gastroesophageal reflux disease)   Headache   migraines Past Surgical History: Past Surgical History: Procedure Laterality Date  COLONOSCOPY WITH PROPOFOL  N/A 08/09/2023  Procedure: COLONOSCOPY WITH BIOPSY;  Surgeon: Selena Daily, MD;  Location: [facility];  Service: Endoscopy;   Laterality: N/A;  POLYPECTOMY N/A 08/09/2023  Procedure: POLYPECTOMY;  Surgeon: Selena Daily, MD;  Location: [facility];  Service: Endoscopy;  Laterality: N/A; Amil Kale, M.A., CCC-SLP Speech Language Pathology, Acute Rehabilitation Services Secure Chat preferred [phone] 04/21/2024, 1:19 PM  EEG adult Result Date: 04/21/2024 Arleene Lack, MD     04/21/2024 11:51 AM Patient Name: Lindsay Harris MRN: [id number] Epilepsy Attending: Arleene Lack Referring Physician/Provider: Walton Guppy, MD Date: 04/21/2024 Duration:  22.13 mins Patient history:  47 y.o. female presenting with right-sided numbness and dysarthria after an episode of unresponsiveness and shaking. EEG to evaluate for seizure Level of alertness: Awake, asleep AEDs during EEG study: Ativan Technical aspects: This EEG study was done with scalp electrodes positioned according to the 10-20 International system of electrode placement. Electrical activity was reviewed with band pass filter of 1-70Hz , sensitivity of 7 uV/mm, display speed of 13mm/sec with a 60Hz  notched filter applied as appropriate. EEG data were recorded continuously and digitally stored.  Video monitoring was available and reviewed as appropriate. Description: The posterior dominant rhythm consists of 9 Hz activity of moderate voltage (25-35 uV) seen predominantly in posterior head regions, symmetric and reactive to eye opening and eye closing. Sleep was characterized by vertex waves, sleep spindles (12 to 14 Hz), maximal frontocentral region. Hyperventilation and photic stimulation were not performed.   IMPRESSION: This study is within normal limits. No seizures or epileptiform discharges were seen throughout the recording. A normal interictal EEG does not exclude the diagnosis of epilepsy. Arleene Lack   DG Chest Port 1 View Result Date: 04/21/2024 CLINICAL DATA:  Shortness of breath EXAM: PORTABLE CHEST 1 VIEW COMPARISON:  07/23/2023 FINDINGS: Cardiac  shadows within normal limits. Lungs are well aerated bilaterally. No focal infiltrate or sizable effusion is seen. No bony abnormality is noted. IMPRESSION: No active disease. Electronically Signed   By: Violeta Grey M.D.   On: 04/21/2024 02:44   MR BRAIN WO CONTRAST Result Date: 04/21/2024 CLINICAL DATA:  Neuro deficit, acute, stroke suspected EXAM: MRI HEAD WITHOUT CONTRAST TECHNIQUE: Multiplanar, multiecho pulse sequences of the brain and surrounding structures were obtained without intravenous contrast. COMPARISON:  None Available. FINDINGS: Limited stroke protocol per the ordering provider. Only DWI, FLAIR, and susceptibility weighted imaging was performed. Within this limitation: Brain: No acute infarction, hemorrhage, hydrocephalus, extra-axial collection or mass lesion. Vascular: Limited evaluation without T2 weighted imaging. Skull and upper cervical spine: Normal marrow signal. Sinuses/Orbits: No acute findings. IMPRESSION: No evidence of acute abnormality.  Limited stroke protocol. Electronically Signed   By: Stevenson Elbe M.D.   On: 04/21/2024 00:31   CT ANGIO HEAD NECK W WO CM (CODE STROKE) Result Date: 04/21/2024 CLINICAL DATA:  Stroke, follow up EXAM: CT ANGIOGRAPHY HEAD AND NECK WITH AND WITHOUT CONTRAST TECHNIQUE: Multidetector CT imaging of the head and neck was performed using the standard protocol during bolus administration of intravenous contrast. Multiplanar CT image reconstructions and MIPs were obtained to evaluate the vascular anatomy. Carotid stenosis measurements (when applicable) are obtained utilizing NASCET criteria, using the distal internal carotid diameter as the denominator. RADIATION DOSE REDUCTION: This exam was performed according to the departmental dose-optimization program which includes automated exposure control, adjustment of the mA and/or kV according to patient size and/or use of iterative reconstruction technique. CONTRAST:  75mL OMNIPAQUE  IOHEXOL  350 MG/ML SOLN  COMPARISON:  CT head from today. FINDINGS: CTA NECK FINDINGS Aortic arch: Great vessel origins are patent without significant stenosis. Right carotid system: No evidence of dissection, stenosis (50% or greater), or occlusion. Left carotid system: No evidence of dissection, stenosis (50% or greater), or occlusion. Vertebral arteries: Codominant. No evidence of dissection, stenosis (50% or greater), or occlusion. Skeleton: No acute abnormality on limited assessment. Other neck: No acute abnormality on limited assessment. Upper chest: Visualized lung apices are clear.  Emphysema. Review of the MIP images confirms the above findings CTA HEAD FINDINGS Anterior circulation: Bilateral intracranial ICAs, MCAs, and ACAs are patent without proximal  hemodynamically significant stenosis. Posterior circulation: Bilateral intradural vertebral arteries, basilar artery and bilateral posterior short is are patent without proximal hemodynamically significant stenosis. Venous sinuses: As permitted by contrast timing, patent. Review of the MIP images confirms the above findings IMPRESSION: No emergent large vessel occlusion or proximal hemodynamically significant stenosis. Electronically Signed   By: Stevenson Elbe M.D.   On: 04/21/2024 00:04   CT HEAD CODE STROKE WO CONTRAST Result Date: 04/20/2024 CLINICAL DATA:  Code stroke.  Neuro deficit, acute, stroke suspected EXAM: CT HEAD WITHOUT CONTRAST TECHNIQUE: Contiguous axial images were obtained from the base of the skull through the vertex without intravenous contrast. RADIATION DOSE REDUCTION: This exam was performed according to the departmental dose-optimization program which includes automated exposure control, adjustment of the mA and/or kV according to patient size and/or use of iterative reconstruction technique. COMPARISON:  None Available. FINDINGS: Brain: No evidence of acute infarction, hemorrhage, hydrocephalus, extra-axial collection or mass lesion/mass effect.  Partially empty sella. Vascular: No hyperdense vessel identified. Skull: No acute fracture. Sinuses/Orbits: Clear sinuses.  No acute orbital findings. Other: Small mastoid effusions. ASPECTS [facility] Stroke Program Early CT Score) Total score (0-10 with 10 being normal): 10. IMPRESSION: 1. No evidence of acute intracranial abnormality. 2. ASPECTS is 10. Code stroke imaging results were communicated on 04/20/2024 at 11:57 pm to provider Dr. Bonnita Buttner via secure text paging. Electronically Signed   By: Stevenson Elbe M.D.   On: 04/20/2024 23:57    Microbiology: Results for orders placed or performed during the hospital encounter of 08/23/22  SARS Coronavirus 2 by RT PCR (hospital order, performed in [facility] hospital lab) *cepheid single result test* Anterior Nasal Swab     Status: None   Collection Time: 08/23/22 10:38 AM   Specimen: Anterior Nasal Swab  Result Value Ref Range Status   SARS Coronavirus 2 by RT PCR NEGATIVE NEGATIVE Final    Comment: (NOTE) SARS-CoV-2 target nucleic acids are NOT DETECTED.  The SARS-CoV-2 RNA is generally detectable in upper and lower respiratory specimens during the acute phase of infection. The lowest concentration of SARS-CoV-2 viral copies this assay can detect is 250 copies / mL. A negative result does not preclude SARS-CoV-2 infection and should not be used as the sole basis for treatment or other patient management decisions.  A negative result may occur with improper specimen collection / handling, submission of specimen other than nasopharyngeal swab, presence of viral mutation(s) within the areas targeted by this assay, and inadequate number of viral copies (<250 copies / mL). A negative result must be combined with clinical observations, patient history, and epidemiological information.  Fact Sheet for Patients:   RoadLapTop.co.za  Fact Sheet for Healthcare Providers: http://kim-miller.com/  This  test is not yet approved or  cleared by the [place]  FDA and has been authorized for detection and/or diagnosis of SARS-CoV-2 by FDA under an Emergency Use Authorization (EUA).  This EUA will remain in effect (meaning this test can be used) for the duration of the COVID-19 declaration under Section 564(b)(1) of the Act, 21 U.S.C. section 360bbb-3(b)(1), unless the authorization is terminated or revoked sooner.  Performed at [facility], [address] [place], [place] [address]   Blood culture (single)     Status: None   Collection Time: 08/23/22 10:38 AM   Specimen: BLOOD  Result Value Ref Range Status   Specimen Description BLOOD BLOOD RIGHT ARM  Final   Special Requests   Final    BOTTLES DRAWN AEROBIC AND ANAEROBIC  Blood Culture results may not be optimal due to an excessive volume of blood received in culture bottles   Culture   Final    NO GROWTH 5 DAYS Performed at [facility], [address] [place], [place] [address]    Report Status 08/28/2022 FINAL  Final    Labs: CBC: Recent Labs  Lab 04/20/24 2335 04/20/24 2337 04/21/24 0500  WBC 10.7*  --  8.3  NEUTROABS 5.8  --   --   HGB 14.1 14.6 12.9  HCT 42.6 43.0 39.9  MCV 91.8  --  94.1  PLT 233  --  247   Basic Metabolic Panel: Recent Labs  Lab 04/20/24 2335 04/20/24 2337 04/21/24 0500  NA 137 139 138  K 3.9 4.0 3.7  CL 104 101 108  CO2 21*  --  22  GLUCOSE 91 93 105*  BUN 6 5* <5*  CREATININE 0.84 0.80 0.81  CALCIUM 9.1  --  8.4*   Liver Function Tests: Recent Labs  Lab 04/20/24 2335  AST 35  ALT 35  ALKPHOS 89  BILITOT 0.8  PROT 7.5  ALBUMIN 3.5   CBG: Recent Labs  Lab 04/20/24 2330  GLUCAP 94    Discharge time spent: {LESS THAN/GREATER THAN:26388} 30 minutes.  Signed: MDALA-GAUSI, Ruffin Lada AGATHA, MD [facility] 04/21/2024

## 2024-04-28 ENCOUNTER — Ambulatory Visit: Payer: Medicaid Other | Admitting: Urology

## 2024-04-28 DIAGNOSIS — R32 Unspecified urinary incontinence: Secondary | ICD-10-CM

## 2024-04-28 DIAGNOSIS — N3946 Mixed incontinence: Secondary | ICD-10-CM

## 2024-04-28 LAB — URINALYSIS, COMPLETE
Bilirubin, UA: NEGATIVE
Glucose, UA: NEGATIVE
Ketones, UA: NEGATIVE
Leukocytes,UA: NEGATIVE
Nitrite, UA: NEGATIVE
Protein,UA: NEGATIVE
Specific Gravity, UA: 1.01 (ref 1.005–1.030)
Urobilinogen, Ur: 1 mg/dL (ref 0.2–1.0)
pH, UA: 7.5 (ref 5.0–7.5)

## 2024-04-28 LAB — MICROSCOPIC EXAMINATION: Epithelial Cells (non renal): >10 /HPF — AB (ref 0–10)

## 2024-04-28 MED ORDER — GEMTESA 75 MG PO TABS: 75.0000 mg | ORAL_TABLET | Freq: Every day | ORAL | 11 refills | Status: DC

## 2024-04-28 MED ORDER — GEMTESA 75 MG PO TABS: 75.0000 mg | ORAL_TABLET | Freq: Every day | ORAL | Status: DC

## 2024-04-28 NOTE — Patient Instructions (Signed)

## 2024-04-28 NOTE — Progress Notes (Addendum)
 04/28/2024 2:24 PM   Lindsay Harris May 23, 1976 [id number]  Referring provider: No referring provider defined for this encounter.  No chief complaint on file.   HPI: I was consulted to assist the patient as urinary incontinence.  She leaks with coughing sneezing bending lifting.  She has urge incontinence.  Both are significant.  She has mild to moderate bedwetting.  She wears 4-5 pads a day that are moderately wet or soaked  She voids every hour and cannot hold it for 2 hours.  She gets up almost every hour at night.  She takes a fluid pill and she has ankle edema  Flow is slower.  She does not feel empty.  She has not had a hysterectomy  She is on home oxygen for COPD  She is prediabetic and bowel movements normal.  No bladder surgery kidney stones or bladder infections   PMH: Past Medical History:  Diagnosis Date   Asthma    Dyspnea    GERD (gastroesophageal reflux disease)    Headache    migraines    Surgical History: Past Surgical History:  Procedure Laterality Date   COLONOSCOPY WITH PROPOFOL  N/A 08/09/2023   Procedure: COLONOSCOPY WITH BIOPSY;  Surgeon: Selena Daily, MD;  Location: [facility];  Service: Endoscopy;  Laterality: N/A;   POLYPECTOMY N/A 08/09/2023   Procedure: POLYPECTOMY;  Surgeon: Selena Daily, MD;  Location: [facility];  Service: Endoscopy;  Laterality: N/A;    Home Medications:  Allergies as of 04/28/2024       Reactions   Amoxicillin Anaphylaxis, Hives   Morphine  Hives, Itching, Nausea Only, Shortness Of Breath        Medication List        Accurate as of Apr 28, 2024  2:24 PM. If you have any questions, ask your nurse or doctor.          AeroChamber MV inhaler Use as instructed   albuterol  (2.5 MG/3ML) 0.083% nebulizer solution Commonly known as: PROVENTIL  Take 2.5 mg by nebulization every 4 (four) hours as needed.   albuterol  108 (90 Base) MCG/ACT inhaler Commonly known as: VENTOLIN   HFA Inhale 2 puffs into the lungs every 6 (six) hours as needed.   budesonide -formoterol  160-4.5 MCG/ACT inhaler Commonly known as: Symbicort  Inhale 2 puffs into the lungs in the morning and at bedtime.   cyclobenzaprine 5 MG tablet Commonly known as: FLEXERIL Take 5-10 mg by mouth every 8 (eight) hours as needed.   DULoxetine 60 MG capsule Commonly known as: CYMBALTA Take 60 mg by mouth 2 (two) times daily.   EPINEPHrine 0.3 mg/0.3 mL Soaj injection Commonly known as: EPI-PEN Inject 0.3 mg into the muscle as needed for anaphylaxis.   eszopiclone 1 MG Tabs tablet Commonly known as: LUNESTA Take 1 mg by mouth at bedtime as needed.   furosemide 20 MG tablet Commonly known as: LASIX Take 20 mg by mouth daily as needed.   gabapentin 100 MG capsule Commonly known as: NEURONTIN Take 400 mg by mouth 2 (two) times daily. Take 400mg  in the morning and 400mg  in the afternoon.   gabapentin 600 MG tablet Commonly known as: NEURONTIN Take 600 mg by mouth at bedtime.   lactulose  10 GM/15ML solution Commonly known as: CHRONULAC  Take 30 mLs (20 g total) by mouth daily.   Linzess  290 MCG Caps capsule Generic drug: linaclotide  TAKE 1 CAPSULE BY MOUTH DAILY BEFORE BREAKFAST.   methocarbamol 500 MG tablet Commonly known as: ROBAXIN Take 1,000 mg by  mouth daily.   montelukast 10 MG tablet Commonly known as: SINGULAIR Take 10 mg by mouth at bedtime.   ondansetron  4 mg Tabs tablet Commonly known as: ZOFRAN  Take by mouth every 8 (eight) hours as needed.   Orilissa  200 MG Tabs Generic drug: Elagolix Sodium  Take 1 tablet (200 mg total) by mouth 2 (two) times daily.   pantoprazole  40 MG tablet Commonly known as: PROTONIX  Take 1 tablet (40 mg total) by mouth 2 (two) times daily before a meal.   propranolol ER 120 MG 24 hr capsule Commonly known as: INDERAL LA Take 120 mg by mouth daily.   rosuvastatin 20 MG tablet Commonly known as: CRESTOR Take 20 mg by mouth daily.    Spiriva  HandiHaler 18 MCG inhalation capsule Generic drug: tiotropium Place 1 capsule (18 mcg total) into inhaler and inhale daily.   SUMAtriptan 25 MG tablet Commonly known as: IMITREX Take 25 mg by mouth every 2 (two) hours as needed.   triamcinolone cream 0.1 % Commonly known as: KENALOG Apply topically 2 (two) times daily.        Allergies:  Allergies  Allergen Reactions   Amoxicillin Anaphylaxis and Hives   Morphine  Hives, Itching, Nausea Only and Shortness Of Breath    Family History: Family History  Problem Relation Age of Onset   Colon cancer Mother 73   Ovarian cancer Sister 42       Stage 3   Breast cancer Maternal Grandmother        twice: early 73s and mid 38s   Colon cancer Maternal Uncle        early 21s    Social History:  reports that she has been smoking cigarettes. She started smoking about 38 years ago. She has a 38.4 pack-year smoking history. She does not have any smokeless tobacco history on file. She reports that she does not drink alcohol and does not use drugs.  ROS:                                        Physical Exam: There were no vitals taken for this visit.  Constitutional:  Alert and oriented, No acute distress. HEENT: Blades AT, moist mucus membranes.  Trachea midline, no masses. Cardiovascular: No clubbing, cyanosis, or edema. Respiratory: Normal respiratory effort, no increased work of breathing. GI: Abdomen is soft, nontender, nondistended, no abdominal masses GU: Grade 2 hypermobility bladder neck and a positive cough test.  No significant prolapse.  Blood on tip of speculum and she is on her cycle Skin: No rashes, bruises or suspicious lesions. Lymph: No cervical or inguinal adenopathy. Neurologic: Grossly intact, no focal deficits, moving all 4 extremities. Psychiatric: Normal mood and affect.  Laboratory Data: Lab Results  Component Value Date   WBC 8.3 04/21/2024   HGB 12.9 04/21/2024   HCT 39.9  04/21/2024   MCV 94.1 04/21/2024   PLT 247 04/21/2024    Lab Results  Component Value Date   CREATININE 0.81 04/21/2024    No results found for: "PSA"  No results found for: "TESTOSTERONE"  Lab Results  Component Value Date   HGBA1C 6.1 (H) 09/04/2023    Urinalysis    Component Value Date/Time   COLORURINE YELLOW 04/21/2024 0203   APPEARANCEUR CLEAR 04/21/2024 0203   LABSPEC 1.020 04/21/2024 0203   PHURINE 6.0 04/21/2024 0203   GLUCOSEU NEGATIVE 04/21/2024 0203   HGBUR NEGATIVE 04/21/2024  0203   BILIRUBINUR NEGATIVE 04/21/2024 0203   KETONESUR NEGATIVE 04/21/2024 0203   PROTEINUR NEGATIVE 04/21/2024 0203   UROBILINOGEN 0.2 09/19/2018 1021   NITRITE NEGATIVE 04/21/2024 0203   LEUKOCYTESUR NEGATIVE 04/21/2024 0203    Pertinent Imaging: Urine reviewed and sent for culture.  Chart reviewed  Assessment & Plan: Patient has mixed incontinence and bedwetting.  She is on home oxygen.  She has significant frequency and nocturia.  She has medical comorbidities.  Role of urodynamics and cystoscopy discussed.  Recheck urinalysis next time to make sure there is no blood in the urine.  Call if culture positive.  She had a normal CT scan of the kidneys in June 2024  Patient has significant stress incontinence on pelvic examination but home oxygen and medical comorbidities will need to be taken to consideration    There are no diagnoses linked to this encounter.  No follow-ups on file.  Devorah Fonder, MD  [facility] [address], [address] [place], [place] [address] [phone]

## 2024-04-29 ENCOUNTER — Encounter: Payer: Self-pay | Admitting: Urology

## 2024-04-29 NOTE — Telephone Encounter (Signed)
 Pt was informed that medication was canceled. Pharmacy aware.

## 2024-05-01 LAB — CULTURE, URINE COMPREHENSIVE

## 2024-05-05 NOTE — Addendum Note (Signed)
 Addended by: Deborra Falter on: 05/05/2024 02:36 PM   Modules accepted: Orders

## 2024-05-06 ENCOUNTER — Ambulatory Visit: Payer: Medicaid Other | Admitting: Pulmonary Disease

## 2024-05-06 VITALS — BP 110/80 | HR 112 | Temp 96.8°F | Ht 64.5 in | Wt 240.6 lb

## 2024-05-06 DIAGNOSIS — G4733 Obstructive sleep apnea (adult) (pediatric): Secondary | ICD-10-CM | POA: Diagnosis not present

## 2024-05-06 DIAGNOSIS — J449 Chronic obstructive pulmonary disease, unspecified: Secondary | ICD-10-CM

## 2024-05-06 DIAGNOSIS — J4489 Other specified chronic obstructive pulmonary disease: Secondary | ICD-10-CM

## 2024-05-06 MED ORDER — IPRATROPIUM-ALBUTEROL 0.5-2.5 (3) MG/3ML IN SOLN
3.0000 mL | Freq: Once | RESPIRATORY_TRACT | Status: AC
  Administered 2024-05-06: 3 mL via RESPIRATORY_TRACT

## 2024-05-06 MED ORDER — PREDNISONE 20 MG PO TABS: 40.0000 mg | ORAL_TABLET | Freq: Every day | ORAL | 0 refills | Status: DC

## 2024-05-06 MED ORDER — METHYLPREDNISOLONE ACETATE 80 MG/ML IJ SUSP
120.0000 mg | Freq: Once | INTRAMUSCULAR | Status: AC
  Administered 2024-05-06: 120 mg via INTRAMUSCULAR

## 2024-05-06 MED ORDER — AZITHROMYCIN 500 MG PO TABS: 500.0000 mg | ORAL_TABLET | Freq: Every day | ORAL | 0 refills | Status: DC

## 2024-05-06 NOTE — Progress Notes (Signed)
 Synopsis: Referred in by Macie Saxon, MD   Subjective:   PATIENT ID: Lindsay Harris: female DOB: Aug 14, 1976, MRN: [id number]  Chief Complaint  Patient presents with   Follow-up    Increased SOB. Wheezing. Cough with yellow sputum.    HPI Lindsay Harris is a 48 y.o female patient with a past medical history of severe persistent asthma on triple therapy presenting to the pulmonary clinic today for follow up on her asthma/COPD overlap  I last saw earlier in August 2024 for asthma and she seemed to be in an asthma exacerbation. Referred to the ED and was managed with IV steroids and discharged on 5 days prednisone . She did better and presents today for a follow up visit.   She has had asthma for the past 32 years with multiple exacerbations one of them at age 48 requiring intubation.   Labs:  EOS 300 2024  Total IgE 136 and Allergen panel is underwhelming mostly negative and briefly + for cockroach.   PFTs 2024 - Obstruction Gold Stage II, air trapping and decreased DLCO.   FH: Strong family history of asthma in her mother and grandmother.   SH: Active smoker, smokes 1 ppd for 30 years, no alcohol use and no illicit drug use. Has 1 Dog at home.   OV 05/06/2024 - Here for follow up on her COPD/Asthma overlap. Appears to be in severe COPD/Asthma exacerbation in office. Provided subcutaneous Methylpred 120mg  and duonebs in office and referred home to continue prednisone  and zypec with clear instructions that should she not feel any better to head to the ED. Unfortunately she had to be admitted the following day. This is her second severe exacerbation in 8 months. Besides Smoking cessation I am afraid we have to step up care to Biologics. Plan is to start her on dupixent.   ROS All systems were reviewed and are negative except for the above.  Objective:   Vitals:   05/06/24 1404  BP: 110/80  Pulse: (!) 112  Temp: (!) 96.8 F (36 C)  SpO2: 99%  Weight: 240 lb 9.6 oz (109.1  kg)  Height: 5' 4.5" (1.638 m)   99% on RA BMI Readings from Last 3 Encounters:  05/06/24 40.66 kg/m  04/20/24 40.98 kg/m  02/26/24 39.61 kg/m   Wt Readings from Last 3 Encounters:  05/06/24 240 lb 9.6 oz (109.1 kg)  04/20/24 242 lb 8.1 oz (110 kg)  02/26/24 234 lb 6 oz (106.3 kg)    Physical Exam GEN: Moderate respiratory distress with accessory muscle use.  HEENT: Supple Neck, Reactive Pupils, EOMI  CVS: Normal S1, Normal S2, RRR, No murmurs or ES appreciated  Lungs: diffuse wheezing. No rales/ronchi.   Abdomen: Soft, non tender, non distended, + BS  Extremities: Warm and well perfused, No edema  Skin: No suspicious lesions appreciated  Psych: Good affect.   Ancillary Information   CBC    Component Value Date/Time   WBC 8.3 04/21/2024 0500   RBC 4.24 04/21/2024 0500   HGB 12.9 04/21/2024 0500   HCT 39.9 04/21/2024 0500   PLT 247 04/21/2024 0500   MCV 94.1 04/21/2024 0500   MCH 30.4 04/21/2024 0500   MCHC 32.3 04/21/2024 0500   RDW 15.6 (H) 04/21/2024 0500   LYMPHSABS 3.9 04/20/2024 2335   MONOABS 0.6 04/20/2024 2335   EOSABS 0.3 04/20/2024 2335   BASOSABS 0.1 04/20/2024 2335        No data to display  Labs and imaging were reviewed.   Assessment & Plan:  Lindsay Harris is a 48 y.o female patient with a past medical history of severe persistent asthma on triple therapy presenting to the pulmonary clinic today for further management of her asthma.   #Severe COPD Gold stage II with Chronic hypoxic respiratory failure on 2lpm #Asthma overlap  #Acute exacerbation unclear reason (Prior exacerbation 07/2023)   EOS 300  Total IgE 136  walk test 09/24/2023 663feet  A1AT normal.   []  c/w budesonide -formoterol  [Symbicort ] 160-4.5 2puffs BID. And Spiriva  daily.  []  Albuterol  as needed.  []  Refer back to pulmonary rehab (Had to pause for a period of time)  []  Will start her on biologics.  []  Will need Echocardiogram for evaluation of PH.   #Mild OSA  (AHI 6.8 2024) Reports lound snoring and apneic episodes. Overweight and increased neck circumference with extreme fatigue during the day. Significant Daytime sleepiness and multiple naps during the day.    []  c/w Auto CPAP 5-15 with 2L O2  []  Resmed Report reviewed and compliance is adequate >= 40%. AHI improved to less than 5. Patient feels significantly better with imprved daily fatigue.   #Tobacco use disorder  Discussed the importance of smoking cessation and dramatic impact on copd progression. She is ready to stop and has decided on Monday next week. Provided NRT.   RTC 4 months.   I spent minutes caring for this patient today, including preparing to see the patient, obtaining a medical history , reviewing a separately obtained history, performing a medically appropriate examination and/or evaluation, documenting clinical information in the electronic health record, and independently interpreting results (not separately reported/billed) and communicating results to the patient/family/caregiver  [name], MD [facility] 05/06/2024 2:16 PM

## 2024-05-07 ENCOUNTER — Emergency Department

## 2024-05-07 ENCOUNTER — Inpatient Hospital Stay
Admission: EM | Admit: 2024-05-07 | Discharge: 2024-05-08 | DRG: 190 | Disposition: A | Discharge status: Home Health | Attending: Internal Medicine | Admitting: Internal Medicine

## 2024-05-07 ENCOUNTER — Other Ambulatory Visit: Payer: Self-pay

## 2024-05-07 DIAGNOSIS — K219 Gastro-esophageal reflux disease without esophagitis: Secondary | ICD-10-CM | POA: Diagnosis present

## 2024-05-07 DIAGNOSIS — J439 Emphysema, unspecified: Secondary | ICD-10-CM | POA: Diagnosis present

## 2024-05-07 DIAGNOSIS — Z1152 Encounter for screening for COVID-19: Secondary | ICD-10-CM | POA: Diagnosis not present

## 2024-05-07 DIAGNOSIS — R0602 Shortness of breath: Secondary | ICD-10-CM | POA: Diagnosis present

## 2024-05-07 DIAGNOSIS — F411 Generalized anxiety disorder: Secondary | ICD-10-CM | POA: Diagnosis present

## 2024-05-07 DIAGNOSIS — Z79899 Other long term (current) drug therapy: Secondary | ICD-10-CM | POA: Diagnosis not present

## 2024-05-07 DIAGNOSIS — G8929 Other chronic pain: Secondary | ICD-10-CM | POA: Diagnosis present

## 2024-05-07 DIAGNOSIS — F17200 Nicotine dependence, unspecified, uncomplicated: Secondary | ICD-10-CM | POA: Diagnosis present

## 2024-05-07 DIAGNOSIS — Z7952 Long term (current) use of systemic steroids: Secondary | ICD-10-CM | POA: Diagnosis not present

## 2024-05-07 DIAGNOSIS — G4733 Obstructive sleep apnea (adult) (pediatric): Secondary | ICD-10-CM | POA: Diagnosis present

## 2024-05-07 DIAGNOSIS — E7800 Pure hypercholesterolemia, unspecified: Secondary | ICD-10-CM | POA: Diagnosis present

## 2024-05-07 DIAGNOSIS — Z6841 Body Mass Index (BMI) 40.0 and over, adult: Secondary | ICD-10-CM | POA: Diagnosis not present

## 2024-05-07 DIAGNOSIS — J9620 Acute and chronic respiratory failure, unspecified whether with hypoxia or hypercapnia: Secondary | ICD-10-CM | POA: Diagnosis present

## 2024-05-07 DIAGNOSIS — Z880 Allergy status to penicillin: Secondary | ICD-10-CM | POA: Diagnosis not present

## 2024-05-07 DIAGNOSIS — Z9981 Dependence on supplemental oxygen: Secondary | ICD-10-CM | POA: Diagnosis not present

## 2024-05-07 DIAGNOSIS — J441 Chronic obstructive pulmonary disease with (acute) exacerbation: Secondary | ICD-10-CM | POA: Diagnosis present

## 2024-05-07 DIAGNOSIS — F17210 Nicotine dependence, cigarettes, uncomplicated: Secondary | ICD-10-CM | POA: Diagnosis present

## 2024-05-07 DIAGNOSIS — G4730 Sleep apnea, unspecified: Secondary | ICD-10-CM | POA: Diagnosis present

## 2024-05-07 DIAGNOSIS — Z7951 Long term (current) use of inhaled steroids: Secondary | ICD-10-CM

## 2024-05-07 DIAGNOSIS — E66813 Obesity, class 3: Secondary | ICD-10-CM | POA: Diagnosis present

## 2024-05-07 DIAGNOSIS — J9611 Chronic respiratory failure with hypoxia: Secondary | ICD-10-CM | POA: Diagnosis present

## 2024-05-07 DIAGNOSIS — K5900 Constipation, unspecified: Secondary | ICD-10-CM | POA: Diagnosis present

## 2024-05-07 HISTORY — DX: Chronic obstructive pulmonary disease, unspecified: J44.9

## 2024-05-07 LAB — BLOOD GAS, VENOUS
Acid-base deficit: 2.6 mmol/L — ABNORMAL HIGH (ref 0.0–2.0)
Bicarbonate: 23.7 mmol/L (ref 20.0–28.0)
O2 Saturation: 70.6 %
Patient temperature: 37
pCO2, Ven: 46 mmHg (ref 44–60)
pH, Ven: 7.32 (ref 7.25–7.43)
pO2, Ven: 46 mmHg — ABNORMAL HIGH (ref 32–45)

## 2024-05-07 LAB — COMPREHENSIVE METABOLIC PANEL WITH GFR
ALT: 36 U/L (ref 0–44)
AST: 45 U/L — ABNORMAL HIGH (ref 15–41)
Albumin: 4 g/dL (ref 3.5–5.0)
Alkaline Phosphatase: 89 U/L (ref 38–126)
Anion gap: 19 — ABNORMAL HIGH (ref 5–15)
BUN: 9 mg/dL (ref 6–20)
CO2: 20 mmol/L — ABNORMAL LOW (ref 22–32)
Calcium: 9.3 mg/dL (ref 8.9–10.3)
Chloride: 100 mmol/L (ref 98–111)
Creatinine, Ser: 0.92 mg/dL (ref 0.44–1.00)
GFR, Estimated: >60 mL/min (ref >60)
Glucose, Bld: 199 mg/dL — ABNORMAL HIGH (ref 70–99)
Potassium: 3.8 mmol/L (ref 3.5–5.1)
Sodium: 139 mmol/L (ref 135–145)
Total Bilirubin: 0.7 mg/dL (ref 0.0–1.2)
Total Protein: 7.9 g/dL (ref 6.5–8.1)

## 2024-05-07 LAB — TROPONIN I (HIGH SENSITIVITY)
Troponin I (High Sensitivity): 4 ng/L (ref <18)
Troponin I (High Sensitivity): 9 ng/L (ref <18)

## 2024-05-07 LAB — CBC WITH DIFFERENTIAL/PLATELET
Abs Immature Granulocytes: 0.08 10*3/uL — ABNORMAL HIGH (ref 0.00–0.07)
Basophils Absolute: 0.1 10*3/uL (ref 0.0–0.1)
Basophils Relative: 0 %
Eosinophils Absolute: 0.1 10*3/uL (ref 0.0–0.5)
Eosinophils Relative: 1 %
HCT: 45.2 % (ref 36.0–46.0)
Hemoglobin: 14.6 g/dL (ref 12.0–15.0)
Immature Granulocytes: 1 %
Lymphocytes Relative: 28 %
Lymphs Abs: 4.2 10*3/uL — ABNORMAL HIGH (ref 0.7–4.0)
MCH: 30.5 pg (ref 26.0–34.0)
MCHC: 32.3 g/dL (ref 30.0–36.0)
MCV: 94.4 fL (ref 80.0–100.0)
Monocytes Absolute: 0.6 10*3/uL (ref 0.1–1.0)
Monocytes Relative: 4 %
Neutro Abs: 10 10*3/uL — ABNORMAL HIGH (ref 1.7–7.7)
Neutrophils Relative %: 66 %
Platelets: 328 10*3/uL (ref 150–400)
RBC: 4.79 MIL/uL (ref 3.87–5.11)
RDW: 15.3 % (ref 11.5–15.5)
Smear Review: NORMAL
WBC: 15 10*3/uL — ABNORMAL HIGH (ref 4.0–10.5)
nRBC: 0 % (ref 0.0–0.2)

## 2024-05-07 LAB — HEMOGLOBIN A1C
Hgb A1c MFr Bld: 5.7 % — ABNORMAL HIGH (ref 4.8–5.6)
Mean Plasma Glucose: 116.89 mg/dL

## 2024-05-07 LAB — CBC
HCT: 41.2 % (ref 36.0–46.0)
Hemoglobin: 13.7 g/dL (ref 12.0–15.0)
MCH: 30.4 pg (ref 26.0–34.0)
MCHC: 33.3 g/dL (ref 30.0–36.0)
MCV: 91.6 fL (ref 80.0–100.0)
Platelets: 292 10*3/uL (ref 150–400)
RBC: 4.5 MIL/uL (ref 3.87–5.11)
RDW: 15.3 % (ref 11.5–15.5)
WBC: 19.3 10*3/uL — ABNORMAL HIGH (ref 4.0–10.5)
nRBC: 0 % (ref 0.0–0.2)

## 2024-05-07 LAB — HCG, QUANTITATIVE, PREGNANCY: hCG, Beta Chain, Quant, S: <1 m[IU]/mL (ref <5)

## 2024-05-07 LAB — CREATININE, SERUM
Creatinine, Ser: 0.79 mg/dL (ref 0.44–1.00)
GFR, Estimated: >60 mL/min (ref >60)

## 2024-05-07 LAB — CBG MONITORING, ED: Glucose-Capillary: 162 mg/dL — ABNORMAL HIGH (ref 70–99)

## 2024-05-07 LAB — RESP PANEL BY RT-PCR (RSV, FLU A&B, COVID)  RVPGX2
Influenza A by PCR: NEGATIVE
Influenza B by PCR: NEGATIVE
Resp Syncytial Virus by PCR: NEGATIVE
SARS Coronavirus 2 by RT PCR: NEGATIVE

## 2024-05-07 LAB — BRAIN NATRIURETIC PEPTIDE: B Natriuretic Peptide: 55.6 pg/mL (ref 0.0–100.0)

## 2024-05-07 MED ORDER — MORPHINE SULFATE (PF) 2 MG/ML IV SOLN: 2.0000 mg | INTRAVENOUS | Status: DC | PRN

## 2024-05-07 MED ORDER — METHYLPREDNISOLONE SODIUM SUCC 125 MG IJ SOLR
125.0000 mg | Freq: Once | INTRAMUSCULAR | Status: AC
  Administered 2024-05-07: 125 mg via INTRAVENOUS
  Filled 2024-05-07: qty 2

## 2024-05-07 MED ORDER — SODIUM CHLORIDE 0.9 % IV SOLN: INTRAVENOUS | Status: AC

## 2024-05-07 MED ORDER — ACETAMINOPHEN 650 MG RE SUPP: 650.0000 mg | Freq: Four times a day (QID) | RECTAL | Status: DC | PRN

## 2024-05-07 MED ORDER — PREDNISONE 20 MG PO TABS
40.0000 mg | ORAL_TABLET | Freq: Every day | ORAL | Status: DC
  Administered 2024-05-08: 40 mg via ORAL
  Filled 2024-05-07: qty 2

## 2024-05-07 MED ORDER — LEVOFLOXACIN IN D5W 750 MG/150ML IV SOLN
750.0000 mg | INTRAVENOUS | Status: DC
  Administered 2024-05-07: 750 mg via INTRAVENOUS
  Filled 2024-05-07 (×2): qty 150

## 2024-05-07 MED ORDER — ONDANSETRON HCL 4 MG/2ML IJ SOLN
4.0000 mg | Freq: Four times a day (QID) | INTRAMUSCULAR | Status: DC | PRN
  Administered 2024-05-08: 4 mg via INTRAVENOUS
  Filled 2024-05-07: qty 2

## 2024-05-07 MED ORDER — IPRATROPIUM-ALBUTEROL 0.5-2.5 (3) MG/3ML IN SOLN
3.0000 mL | Freq: Once | RESPIRATORY_TRACT | Status: AC
  Administered 2024-05-07: 3 mL via RESPIRATORY_TRACT
  Filled 2024-05-07: qty 3

## 2024-05-07 MED ORDER — MAGNESIUM SULFATE 2 GM/50ML IV SOLN
2.0000 g | Freq: Once | INTRAVENOUS | Status: AC
  Administered 2024-05-07: 2 g via INTRAVENOUS
  Filled 2024-05-07: qty 50

## 2024-05-07 MED ORDER — ALBUTEROL SULFATE (2.5 MG/3ML) 0.083% IN NEBU: 2.5000 mg | INHALATION_SOLUTION | RESPIRATORY_TRACT | Status: DC | PRN

## 2024-05-07 MED ORDER — HEPARIN SODIUM (PORCINE) 5000 UNIT/ML IJ SOLN
5000.0000 [IU] | Freq: Three times a day (TID) | INTRAMUSCULAR | Status: DC
  Administered 2024-05-07 – 2024-05-08 (×3): 5000 [IU] via SUBCUTANEOUS
  Filled 2024-05-07 (×3): qty 1

## 2024-05-07 MED ORDER — HYDROCODONE-ACETAMINOPHEN 5-325 MG PO TABS
1.0000 | ORAL_TABLET | ORAL | Status: DC | PRN
  Administered 2024-05-07: 1 via ORAL
  Administered 2024-05-08 (×3): 2 via ORAL
  Filled 2024-05-07 (×3): qty 2
  Filled 2024-05-07: qty 1

## 2024-05-07 MED ORDER — ACETAMINOPHEN 325 MG PO TABS: 650.0000 mg | ORAL_TABLET | Freq: Four times a day (QID) | ORAL | Status: DC | PRN

## 2024-05-07 MED ORDER — IPRATROPIUM-ALBUTEROL 0.5-2.5 (3) MG/3ML IN SOLN
3.0000 mL | Freq: Four times a day (QID) | RESPIRATORY_TRACT | Status: DC
  Administered 2024-05-07 – 2024-05-08 (×5): 3 mL via RESPIRATORY_TRACT
  Filled 2024-05-07 (×5): qty 3

## 2024-05-07 MED ORDER — ONDANSETRON HCL 4 MG PO TABS: 4.0000 mg | ORAL_TABLET | Freq: Four times a day (QID) | ORAL | Status: DC | PRN

## 2024-05-07 MED ORDER — SENNOSIDES-DOCUSATE SODIUM 8.6-50 MG PO TABS: 1.0000 | ORAL_TABLET | Freq: Every evening | ORAL | Status: DC | PRN

## 2024-05-07 MED ORDER — LORAZEPAM 2 MG/ML IJ SOLN
0.5000 mg | Freq: Once | INTRAMUSCULAR | Status: AC
  Administered 2024-05-07: 0.5 mg via INTRAVENOUS
  Filled 2024-05-07: qty 1

## 2024-05-07 MED ORDER — IPRATROPIUM-ALBUTEROL 0.5-2.5 (3) MG/3ML IN SOLN
3.0000 mL | Freq: Once | RESPIRATORY_TRACT | Status: AC
  Administered 2024-05-07: 3 mL via RESPIRATORY_TRACT

## 2024-05-07 MED ORDER — NICOTINE 21 MG/24HR TD PT24
21.0000 mg | MEDICATED_PATCH | Freq: Every day | TRANSDERMAL | Status: DC
Start: 2024-05-07 — End: 2024-05-08
  Administered 2024-05-07 – 2024-05-08 (×2): 21 mg via TRANSDERMAL
  Filled 2024-05-07 (×2): qty 1

## 2024-05-07 MED ORDER — METHYLPREDNISOLONE SODIUM SUCC 40 MG IJ SOLR
40.0000 mg | Freq: Two times a day (BID) | INTRAMUSCULAR | Status: AC
  Administered 2024-05-07 – 2024-05-08 (×2): 40 mg via INTRAVENOUS
  Filled 2024-05-07 (×2): qty 1

## 2024-05-07 MED ORDER — INSULIN ASPART 100 UNIT/ML IJ SOLN
0.0000 [IU] | Freq: Three times a day (TID) | INTRAMUSCULAR | Status: DC
  Administered 2024-05-07: 1 [IU] via SUBCUTANEOUS
  Administered 2024-05-08: 2 [IU] via SUBCUTANEOUS
  Administered 2024-05-08: 3 [IU] via SUBCUTANEOUS
  Filled 2024-05-07 (×3): qty 1

## 2024-05-07 NOTE — ED Triage Notes (Signed)
 Pt assisted out of car, pt in resp distress, DxX'ed with PNA. Pt wears 3L/chronically. Pt still smokes. HX of emphysema and COPD. Pt very anxious. Pt roomed to RM 5, Dr. Cam Cava at bedside.

## 2024-05-07 NOTE — H&P (Addendum)
 History and Physical    Lindsay Harris XBJ:[id number] DOB: 03-Sep-1976 DOA: 05/07/2024  PCP: Macie Saxon, MD  Patient coming from: home  I have personally briefly reviewed patient's old medical records in [facility]  Chief Complaint: sob/doe  HPI: Lindsay Harris is a 48 y.o. female with medical history significant of   asthma/COPD overlap on 2L home O2, prior hx remote history of intubation, OSA on CPAP, chronic hypoxic respiratory failure, chronic pain, and anxiety,continued tobacco abuse, presents to ED in referral from pulmonalogist 24 hours ago with progressive sob and doe. Patient notes no chest ain no fever or chills, no n/v/d or abdominal pain. Patient does note + productive cough.  ED Course:  In ED Patient on presentation in acute sob with increase wob but stable stat on her chronic home O2 of 2L.  Patient to assist with breathing was placed on bipap. Patient was treated with magnesium  back to back nebs, solumedrol with improvement in her symptoms.  Patient is slated for admission to PCU for Acute COPD exacerbation.    Vitals: Temp 96.8, bp 110/80, hr 112, sat 99% on 3L   Vb: 7.33/46 Na 139, K 3.8,  bicarb 20, glu 199 cr 0.92 AG 19 Wbc 15,  hgb 14.6, plt328, pmn increased  BNP 55.6 Cxr: prelim NAD RVP: negative  EKG:nsr pvc    Review of Systems: As per HPI otherwise 10 point review of systems negative.   Past Medical History:  Diagnosis Date   Asthma    COPD (chronic obstructive pulmonary disease) (HCC)    Dyspnea    GERD (gastroesophageal reflux disease)    Headache    migraines    Past Surgical History:  Procedure Laterality Date   COLONOSCOPY WITH PROPOFOL  N/A 08/09/2023   Procedure: COLONOSCOPY WITH BIOPSY;  Surgeon: Selena Daily, MD;  Location: [facility];  Service: Endoscopy;  Laterality: N/A;   POLYPECTOMY N/A 08/09/2023   Procedure: POLYPECTOMY;  Surgeon: Selena Daily, MD;  Location: [facility];  Service:  Endoscopy;  Laterality: N/A;     reports that she has been smoking cigarettes. She started smoking about 38 years ago. She has a 38.4 pack-year smoking history. She does not have any smokeless tobacco history on file. She reports that she does not drink alcohol and does not use drugs.  Allergies  Allergen Reactions   Amoxicillin Anaphylaxis and Hives    Family History  Problem Relation Age of Onset   Colon cancer Mother 23   Ovarian cancer Sister 31       Stage 3   Breast cancer Maternal Grandmother        twice: early 29s and mid 97s   Colon cancer Maternal Uncle        early 30s    Prior to Admission medications   Medication Sig Start Date End Date Taking? Authorizing Provider  albuterol  (PROVENTIL ) (2.5 MG/3ML) 0.083% nebulizer solution Take 2.5 mg by nebulization every 4 (four) hours as needed. 06/18/23   [provider]  albuterol  (VENTOLIN  HFA) 108 (90 Base) MCG/ACT inhaler Inhale 2 puffs into the lungs every 6 (six) hours as needed. 11/22/23   Assaker, Marianne Shirts, MD  azithromycin (ZITHROMAX) 500 MG tablet Take 1 tablet (500 mg total) by mouth daily. 05/06/24   Assaker, Marianne Shirts, MD  budesonide -formoterol  (SYMBICORT ) 160-4.5 MCG/ACT inhaler Inhale 2 puffs into the lungs in the morning and at bedtime. 08/06/23   Assaker, Marianne Shirts, MD  cyclobenzaprine (FLEXERIL) 5 MG tablet Take  5-10 mg by mouth every 8 (eight) hours as needed. 11/27/23   [provider]  DULoxetine (CYMBALTA) 60 MG capsule Take 60 mg by mouth 2 (two) times daily. 08/31/23   [provider]  Elagolix Sodium  (ORILISSA ) 200 MG TABS Take 1 tablet (200 mg total) by mouth 2 (two) times daily. 07/30/23   Dove, Myra C, MD  EPINEPHrine 0.3 mg/0.3 mL IJ SOAJ injection Inject 0.3 mg into the muscle as needed for anaphylaxis. 05/29/23   [provider]  eszopiclone (LUNESTA) 1 MG TABS tablet Take 1 mg by mouth at bedtime as needed. 12/03/23   [provider]  furosemide (LASIX) 20  MG tablet Take 20 mg by mouth daily as needed. 11/27/23   [provider]  gabapentin (NEURONTIN) 100 MG capsule Take 400 mg by mouth 2 (two) times daily. Take 400mg  in the morning and 400mg  in the afternoon. 01/21/24   [provider]  gabapentin (NEURONTIN) 600 MG tablet Take 600 mg by mouth at bedtime. 03/05/24   [provider]  lactulose  (CHRONULAC ) 10 GM/15ML solution Take 30 mLs (20 g total) by mouth daily. 01/29/24   Selena Daily, MD  linaclotide  (LINZESS ) 290 MCG CAPS capsule TAKE 1 CAPSULE BY MOUTH DAILY BEFORE BREAKFAST. 09/26/23   Vanga, Rohini Reddy, MD  methocarbamol (ROBAXIN) 500 MG tablet Take 1,000 mg by mouth daily. 01/21/24   [provider]  montelukast (SINGULAIR) 10 MG tablet Take 10 mg by mouth at bedtime. 05/03/23   [provider]  ondansetron  (ZOFRAN ) 4 mg TABS tablet Take by mouth every 8 (eight) hours as needed. 05/03/23   [provider]  pantoprazole  (PROTONIX ) 40 MG tablet Take 1 tablet (40 mg total) by mouth 2 (two) times daily before a meal. 12/27/23   Vanga, Elson Halon, MD  predniSONE  (DELTASONE ) 20 MG tablet Take 2 tablets (40 mg total) by mouth daily with breakfast for 5 days. 05/06/24 05/11/24  [name], MD  propranolol ER (INDERAL LA) 120 MG 24 hr capsule Take 120 mg by mouth daily. 02/29/24   [provider]  rosuvastatin (CRESTOR) 20 MG tablet Take 20 mg by mouth daily. 10/05/23   [provider]  Simethicone 180 MG CAPS Take 1 capsule by mouth 3 (three) times daily as needed. 04/25/24   [provider]  Spacer/Aero-Holding Idelle Majors (AEROCHAMBER MV) inhaler Use as instructed 02/07/24   Assaker, Marianne Shirts, MD  SPIRIVA  HANDIHALER 18 MCG inhalation capsule Place 1 capsule (18 mcg total) into inhaler and inhale daily. 11/22/23   Assaker, Marianne Shirts, MD  SUMAtriptan (IMITREX) 25 MG tablet Take 25 mg by mouth every 2 (two) hours as needed. 05/28/23   [provider]   triamcinolone cream (KENALOG) 0.1 % Apply topically 2 (two) times daily. 05/30/23   [provider]    Physical Exam: Vitals:   05/07/24 1340 05/07/24 1400 05/07/24 1415  BP:  116/84   Pulse:  (!) 117 (!) 110  Resp:  (!) 26 (!) 27  SpO2:  97% 95%  Weight: 109 kg    Height: 5\' 4"  (1.626 m)      Constitutional: noted conversational doe Vitals:   05/07/24 1340 05/07/24 1400 05/07/24 1415  BP:  116/84   Pulse:  (!) 117 (!) 110  Resp:  (!) 26 (!) 27  SpO2:  97% 95%  Weight: 109 kg    Height: 5\' 4"  (1.626 m)     Eyes: PERRL, lids and conjunctivae normal ENMT: Mucous membranes are moist.  Neck: normal, supple, no masses, no thyromegaly Respiratory: c bilaterally,wheezing, no crackles. increase respiratory effort. No accessory muscle use.  Cardiovascular: Regular rate and rhythm, no murmurs / rubs / gallops. No extremity edema. 2+ pedal pulses.  Abdomen: no tenderness, no masses palpated. No hepatosplenomegaly. Bowel sounds positive.  Musculoskeletal: no clubbing / cyanosis. No joint deformity upper and lower extremities. Good ROM, no contractures. Normal muscle tone.  Skin: no rashes, lesions, ulcers. No induration Neurologic: CN 2-12 grossly intact. Sensation intact,  Strength 5/5 in all 4.  Psychiatric: Normal judgment and insight. Alert and oriented x 3. Normal mood.    Labs on Admission: I have personally reviewed following labs and imaging studies  CBC: Recent Labs  Lab 05/07/24 1340  WBC 15.0*  NEUTROABS 10.0*  HGB 14.6  HCT 45.2  MCV 94.4  PLT 328   Basic Metabolic Panel: Recent Labs  Lab 05/07/24 1340  NA 139  K 3.8  CL 100  CO2 20*  GLUCOSE 199*  BUN 9  CREATININE 0.92  CALCIUM 9.3   GFR: Estimated Creatinine Clearance: 91.2 mL/min (by C-G formula based on SCr of 0.92 mg/dL). Liver Function Tests: Recent Labs  Lab 05/07/24 1340  AST 45*  ALT 36  ALKPHOS 89  BILITOT 0.7  PROT 7.9  ALBUMIN 4.0   No results for input(s): "LIPASE",  "AMYLASE" in the last 168 hours. No results for input(s): "AMMONIA" in the last 168 hours. Coagulation Profile: No results for input(s): "INR", "PROTIME" in the last 168 hours. Cardiac Enzymes: No results for input(s): "CKTOTAL", "CKMB", "CKMBINDEX", "TROPONINI" in the last 168 hours. BNP (last 3 results) No results for input(s): "PROBNP" in the last 8760 hours. HbA1C: No results for input(s): "HGBA1C" in the last 72 hours. CBG: No results for input(s): "GLUCAP" in the last 168 hours. Lipid Profile: No results for input(s): "CHOL", "HDL", "LDLCALC", "TRIG", "CHOLHDL", "LDLDIRECT" in the last 72 hours. Thyroid Function Tests: No results for input(s): "TSH", "T4TOTAL", "FREET4", "T3FREE", "THYROIDAB" in the last 72 hours. Anemia Panel: No results for input(s): "VITAMINB12", "FOLATE", "FERRITIN", "TIBC", "IRON", "RETICCTPCT" in the last 72 hours. Urine analysis:    Component Value Date/Time   COLORURINE YELLOW 04/21/2024 0203   APPEARANCEUR Slightly cloudy 04/28/2024 1425   LABSPEC 1.020 04/21/2024 0203   PHURINE 6.0 04/21/2024 0203   GLUCOSEU Negative 04/28/2024 1425   HGBUR NEGATIVE 04/21/2024 0203   BILIRUBINUR Negative 04/28/2024 1425   KETONESUR NEGATIVE 04/21/2024 0203   PROTEINUR Negative 04/28/2024 1425   PROTEINUR NEGATIVE 04/21/2024 0203   UROBILINOGEN 0.2 09/19/2018 1021   NITRITE Negative 04/28/2024 1425   NITRITE NEGATIVE 04/21/2024 0203   LEUKOCYTESUR Negative 04/28/2024 1425   LEUKOCYTESUR NEGATIVE 04/21/2024 0203    Radiological Exams on Admission: No results found.  EKG: Independently reviewed.   Assessment/Plan  Acute COPD exacerbation with chronic hypoxic respiratory failure  -admit to progressive care  - solumedrol iv , bid taper to po prednisone  daily - start abx per protocol -f/u on sputum cultures  -consider further imaging of chest if patient does not improve -cxr : NAD - nebs standing and prn  -resume chronic inhalers  -pulmonary toilet   -wean O2 as able    OSA  -CPAP at bedtime   Chronic pain -supportive care   Anxiety -resume home regimen   Tobacco abuse -encourage cessation  -nicotine  patch    DVT prophylaxis: heparin Code Status: full/ as discussed per patient wishes in event of cardiac arrest  Family Communication: family at bedside Disposition Plan:  patient  expected to be admitted greater than 2 midnights  Consults called: pulmonary  Admission status: progressive care    Sabas Cradle MD [facility]   If 7PM-7AM, please contact night-coverage [email or web address] Password [email or web address]  05/07/2024, 3:13 PM

## 2024-05-07 NOTE — ED Provider Notes (Signed)
 [facility] Provider Note    Event Date/Time   First MD Initiated Contact with Patient 05/07/24 1337     (approximate)   History   Respiratory Distress  Pt assisted out of car, pt in resp distress, DxX'ed with PNA. Pt wears 3L/chronically. Pt still smokes. HX of emphysema and COPD. Pt very anxious. Pt roomed to RM 5, Dr. Cam Cava at bedside.    HPI Lindsay Harris is a 48 y.o. 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  Daughter bedside notes that patient was seen in pulmonology clinic and recommended to come to emergency department for admission of due to her poor breathing.  Trialed nebulizers at home instead.  I personally reviewed patient's pulmonology note from yesterday.  History of multiple prior hospitalizations for asthma including intubation when she was a teenager.     Physical Exam   Triage Vital Signs: BP 116/84   Pulse (!) 110   Resp (!) 27   Ht 5\' 4"  (1.626 m)   Wt 109 kg   LMP  (LMP Unknown)   SpO2 95%   BMI 41.25 kg/m    Most recent vital signs: Vitals:   05/07/24 1400 05/07/24 1415  BP: 116/84   Pulse: (!) 117 (!) 110  Resp: (!) 26 (!) 27  SpO2: 97% 95%     General: Awake, tripoding, severe respiratory distress, appears very anxious CV:  Good peripheral perfusion.  Tachycardic, regular rhythm Resp:  Severe respiratory distress, unable to speak, diminished airflow throughout    ED Results / Procedures / Treatments   Labs (all labs ordered are listed, but only abnormal results are displayed) Labs Reviewed  CBC WITH DIFFERENTIAL/PLATELET - Abnormal; Notable for the following components:      Result Value   WBC 15.0 (*)    Neutro Abs 10.0 (*)    Lymphs Abs 4.2 (*)    Abs Immature Granulocytes 0.08 (*)    All other components within normal limits  COMPREHENSIVE METABOLIC PANEL WITH GFR -  Abnormal; Notable for the following components:   CO2 20 (*)    Glucose, Bld 199 (*)    AST 45 (*)    Anion gap 19 (*)    All other components within normal limits  BLOOD GAS, VENOUS - Abnormal; Notable for the following components:   pO2, Ven 46 (*)    Acid-base deficit 2.6 (*)    All other components within normal limits  RESP PANEL BY RT-PCR (RSV, FLU A&B, COVID)  RVPGX2  BRAIN NATRIURETIC PEPTIDE  HCG, QUANTITATIVE, PREGNANCY  TROPONIN I (HIGH SENSITIVITY)     EKG  Ecg = sinus tachycardia, rate 123, no appreciable ST elevation or depression, no gross evidence of ischemia nor arrhythmia on my read.  Intervals normal.   RADIOLOGY Radiology interpreted by myself.  Radiology report later reviewed.   PROCEDURES:  Critical Care performed: Yes, see critical care procedure note(s)  .Critical Care  Performed by: Collis Deaner, MD Authorized by: Collis Deaner, MD   Critical care provider statement:    Critical care time (minutes):  30   Critical care was necessary to treat or prevent imminent or life-threatening deterioration of the following conditions:  Respiratory failure (Severe asthma/COPD exacerbation)   Critical care was time spent personally by me on the following activities:  Development of treatment plan with patient or  surrogate, discussions with consultants, evaluation of patient's response to treatment, examination of patient, ordering and review of laboratory studies, ordering and review of radiographic studies, ordering and performing treatments and interventions, pulse oximetry, re-evaluation of patient's condition and review of old charts   I assumed direction of critical care for this patient from another provider in my specialty: no     Care discussed with: admitting provider   Comments:     Tripoding, unable to speak.  Immediately started DuoNebs, BiPAP, prednisone , magnesium     MEDICATIONS ORDERED IN ED: Medications  ipratropium-albuterol  (DUONEB)  0.5-2.5 (3) MG/3ML nebulizer solution 3 mL (3 mLs Nebulization Given 05/07/24 1346)  methylPREDNISolone  sodium succinate (SOLU-MEDROL ) 125 mg/2 mL injection 125 mg (125 mg Intravenous Given 05/07/24 1342)  magnesium  sulfate IVPB 2 g 50 mL (2 g Intravenous New Bag/Given 05/07/24 1343)  ipratropium-albuterol  (DUONEB) 0.5-2.5 (3) MG/3ML nebulizer solution 3 mL (3 mLs Nebulization Given 05/07/24 1342)  LORazepam  (ATIVAN ) injection 0.5 mg (0.5 mg Intravenous Given 05/07/24 1347)  ipratropium-albuterol  (DUONEB) 0.5-2.5 (3) MG/3ML nebulizer solution 3 mL (3 mLs Nebulization Given 05/07/24 1437)     IMPRESSION / MDM / ASSESSMENT AND PLAN / ED COURSE  I reviewed the triage vital signs and the nursing notes.                              DDX/MDM/AP: Differential diagnosis includes, but is not limited to, severe asthma/COPD exacerbation, consider underlying pneumonia or viral illness.  Highly doubt ACS, PE, pneumothorax.   Plan: - BiPAP - DuoNebs, IV methylprednisolone , IV magnesium  - Chest x-ray - EKG - Labs - Anticipate admission  Patient's presentation is most consistent with acute presentation with potential threat to life or bodily function.  The patient is on the cardiac monitor to evaluate for evidence of arrhythmia and/or significant heart rate changes.  ED course below.  Responding very well to above treatments, comfortably improved, feeling much better on BiPAP.  Did give small dose of IV Ativan .  Suspect leukocytosis secondary to prednisone  which was started yesterday.  Admitted to hospitalist service.  Clinical Course as of 05/07/24 1509  Wed May 07, 2024  1406 CMP reviewed, notable only for isolated elevated anion gap of unclear etiology  VBG reviewed, unremarkable  CBC with leukocytosis, otherwise unremarkable.  [MM]  1422 Xr w/ no focal consolidation or evidence of vascular congestion on my read, formal read pending [MM]  1434 Trop wnl Bnp wnl  Reassessed, able to speak 5 word  sentences, appears much more comfortable and much less anxious.  Markedly improved airflow throughout though does have persistent end-expiratory wheezing.  Confirms she did start oral prednisone  yesterday, suspect this is likely etiology of her leukocytosis.  Has responded very well to treatment but do recommend admission.  Hospitalist consult order placed.  Will also give further round of DuoNeb. [MM]    Clinical Course User Index [MM] Collis Deaner, MD     FINAL CLINICAL IMPRESSION(S) / ED DIAGNOSES   Final diagnoses:  Asthma with COPD with exacerbation (HCC)     Rx / DC Orders   ED Discharge Orders     None        Note:  This document was prepared using Dragon voice recognition software and may include unintentional dictation errors.   Collis Deaner, MD 05/07/24 [phone]

## 2024-05-08 DIAGNOSIS — E66813 Obesity, class 3: Secondary | ICD-10-CM | POA: Diagnosis present

## 2024-05-08 DIAGNOSIS — J441 Chronic obstructive pulmonary disease with (acute) exacerbation: Secondary | ICD-10-CM | POA: Diagnosis not present

## 2024-05-08 LAB — GLUCOSE, CAPILLARY
Glucose-Capillary: 210 mg/dL — ABNORMAL HIGH (ref 70–99)
Glucose-Capillary: 217 mg/dL — ABNORMAL HIGH (ref 70–99)
Glucose-Capillary: 261 mg/dL — ABNORMAL HIGH (ref 70–99)

## 2024-05-08 LAB — RESPIRATORY PANEL BY PCR

## 2024-05-08 LAB — CBC
HCT: 38.3 % (ref 36.0–46.0)
Hemoglobin: 12.7 g/dL (ref 12.0–15.0)
MCH: 30.1 pg (ref 26.0–34.0)
MCHC: 33.2 g/dL (ref 30.0–36.0)
MCV: 90.8 fL (ref 80.0–100.0)
Platelets: 300 10*3/uL (ref 150–400)
RBC: 4.22 MIL/uL (ref 3.87–5.11)
RDW: 15.5 % (ref 11.5–15.5)
WBC: 17.6 10*3/uL — ABNORMAL HIGH (ref 4.0–10.5)
nRBC: 0 % (ref 0.0–0.2)

## 2024-05-08 LAB — BASIC METABOLIC PANEL WITH GFR
Anion gap: 10 (ref 5–15)
BUN: 12 mg/dL (ref 6–20)
CO2: 23 mmol/L (ref 22–32)
Calcium: 8.8 mg/dL — ABNORMAL LOW (ref 8.9–10.3)
Chloride: 103 mmol/L (ref 98–111)
Creatinine, Ser: 0.84 mg/dL (ref 0.44–1.00)
GFR, Estimated: >60 mL/min (ref >60)
Glucose, Bld: 165 mg/dL — ABNORMAL HIGH (ref 70–99)
Potassium: 3.8 mmol/L (ref 3.5–5.1)
Sodium: 136 mmol/L (ref 135–145)

## 2024-05-08 MED ORDER — LACTULOSE 10 GM/15ML PO SOLN: 20.0000 g | Freq: Every day | ORAL | Status: DC

## 2024-05-08 MED ORDER — PREDNISONE 10 MG PO TABS: ORAL_TABLET | ORAL | 0 refills | Status: AC

## 2024-05-08 MED ORDER — LORAZEPAM 2 MG/ML IJ SOLN
0.5000 mg | Freq: Once | INTRAMUSCULAR | Status: AC
  Administered 2024-05-08: 0.5 mg via INTRAVENOUS
  Filled 2024-05-08: qty 1

## 2024-05-08 MED ORDER — FLUTICASONE FUROATE-VILANTEROL 200-25 MCG/ACT IN AEPB
1.0000 | INHALATION_SPRAY | Freq: Every day | RESPIRATORY_TRACT | Status: DC
  Filled 2024-05-08: qty 28

## 2024-05-08 MED ORDER — DULOXETINE HCL 30 MG PO CPEP: 60.0000 mg | ORAL_CAPSULE | Freq: Two times a day (BID) | ORAL | Status: DC

## 2024-05-08 MED ORDER — LINACLOTIDE 290 MCG PO CAPS: 290.0000 ug | ORAL_CAPSULE | Freq: Every day | ORAL | Status: DC

## 2024-05-08 MED ORDER — METHOCARBAMOL 500 MG PO TABS: 1000.0000 mg | ORAL_TABLET | Freq: Every day | ORAL | Status: DC

## 2024-05-08 MED ORDER — NICOTINE 21 MG/24HR TD PT24: 21.0000 mg | MEDICATED_PATCH | Freq: Every day | TRANSDERMAL | 0 refills | Status: DC

## 2024-05-08 MED ORDER — ROSUVASTATIN CALCIUM 10 MG PO TABS: 20.0000 mg | ORAL_TABLET | Freq: Every day | ORAL | Status: DC

## 2024-05-08 MED ORDER — GABAPENTIN 300 MG PO CAPS: 600.0000 mg | ORAL_CAPSULE | Freq: Every day | ORAL | Status: DC

## 2024-05-08 MED ORDER — LEVOFLOXACIN 750 MG PO TABS: 750.0000 mg | ORAL_TABLET | Freq: Every day | ORAL | 0 refills | Status: AC

## 2024-05-08 MED ORDER — ELAGOLIX SODIUM 200 MG PO TABS: 1.0000 | ORAL_TABLET | Freq: Two times a day (BID) | ORAL | Status: DC

## 2024-05-08 MED ORDER — GABAPENTIN 400 MG PO CAPS: 400.0000 mg | ORAL_CAPSULE | Freq: Two times a day (BID) | ORAL | Status: DC

## 2024-05-08 MED ORDER — PROPRANOLOL HCL ER 60 MG PO CP24
120.0000 mg | ORAL_CAPSULE | Freq: Every day | ORAL | Status: DC
  Filled 2024-05-08: qty 2

## 2024-05-08 MED ORDER — LEVOFLOXACIN 500 MG PO TABS: 750.0000 mg | ORAL_TABLET | Freq: Every day | ORAL | Status: DC

## 2024-05-08 MED ORDER — PANTOPRAZOLE SODIUM 40 MG PO TBEC: 40.0000 mg | DELAYED_RELEASE_TABLET | Freq: Two times a day (BID) | ORAL | Status: DC

## 2024-05-08 MED ORDER — MONTELUKAST SODIUM 10 MG PO TABS: 10.0000 mg | ORAL_TABLET | Freq: Every day | ORAL | Status: DC

## 2024-05-08 NOTE — Evaluation (Signed)
 Physical Therapy Evaluation Patient Details Name: Lindsay Harris MRN: [id number] DOB: 1976-07-26 Today's Date: 05/08/2024  History of Present Illness  Lindsay Harris is a 48 y.o. 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 Patient is pleasant and agrees to PT/OT assessments. She is mod I with bed mobility. Transfers with supervision. Patient is able to ambulate 120 feet with RW and cga. Slow labored gait. Limited by chronic back pain and fatigue. O2 sats > 95% throughout session on her baseline 3 liters O2. She will continue to benefit from skilled PT to improve activity tolerance and independence.          If plan is discharge home, recommend the following: A little help with walking and/or transfers;A little help with bathing/dressing/bathroom;Assist for transportation;Help with stairs or ramp for entrance   Can travel by private vehicle    yes    Equipment Recommendations None recommended by PT  Recommendations for Other Services       Functional Status Assessment       Precautions / Restrictions Precautions Precautions: Fall Recall of Precautions/Restrictions: Intact Restrictions Weight Bearing Restrictions Per Provider Order: No      Mobility  Bed Mobility Overal bed mobility: Modified Independent                  Transfers Overall transfer level: Modified independent Equipment used: Rolling walker (2 wheels)                    Ambulation/Gait Ambulation/Gait assistance: Contact guard assist Gait Distance (Feet): 120 Feet Assistive device: Rolling walker (2 wheels) Gait Pattern/deviations: Step-through pattern,  Decreased step length - right, Decreased step length - left, Decreased stride length, Trunk flexed Gait velocity: decr     General Gait Details: slow pace, limited more by back pain than sob. O2 sats >95% on 3 liters throughout session.  Stairs            Wheelchair Mobility     Tilt Bed    Modified Rankin (Stroke Patients Only)       Balance Overall balance assessment: Modified Independent                                           Pertinent Vitals/Pain Pain Assessment Pain Assessment: Faces Faces Pain Scale: Hurts little more Pain Location: back Pain Descriptors / Indicators: Discomfort, Sore Pain Intervention(s): Monitored during session, Repositioned    Home Living Family/patient expects to be discharged to:: Private residence Living Arrangements: Spouse/significant other;Children Available Help at Discharge: Family;Available PRN/intermittently Type of Home: House Home Access: Stairs to enter Entrance Stairs-Rails: Right Entrance Stairs-Number of Steps: 5   Home Layout: One level Home Equipment: Agricultural consultant (2 wheels)      Prior Function Prior Level of Function : Independent/Modified Independent;Driving             Mobility Comments: uses RW at all times, O2 at all times at baseline. ADLs Comments: Daughter assists as needed     Extremity/Trunk Assessment   Upper Extremity Assessment Upper Extremity Assessment: Defer to OT evaluation  Lower Extremity Assessment Lower Extremity Assessment: Generalized weakness    Cervical / Trunk Assessment Cervical / Trunk Assessment: Other exceptions Cervical / Trunk Exceptions: chronic back pain  Communication   Communication Communication: No apparent difficulties    Cognition Arousal: Alert Behavior During Therapy: WFL for tasks assessed/performed   PT - Cognitive impairments: No apparent impairments                         Following commands: Intact        Cueing Cueing Techniques: Verbal cues     General Comments      Exercises     Assessment/Plan    PT Assessment Patient needs continued PT services  PT Problem List Decreased strength;Decreased activity tolerance;Decreased mobility;Cardiopulmonary status limiting activity;Pain;Obesity       PT Treatment Interventions Gait training;Stair training;Functional mobility training;Therapeutic activities;Therapeutic exercise;Patient/family education    PT Goals (Current goals can be found in the Care Plan section)  Acute Rehab PT Goals Patient Stated Goal: return home PT Goal Formulation: With patient/family Time For Goal Achievement: 05/17/24 Potential to Achieve Goals: Good    Frequency Min 2X/week     Co-evaluation               AM-PAC PT "6 Clicks" Mobility  Outcome Measure Help needed turning from your back to your side while in a flat bed without using bedrails?: None Help needed moving from lying on your back to sitting on the side of a flat bed without using bedrails?: None Help needed moving to and from a bed to a chair (including a wheelchair)?: A Little Help needed standing up from a chair using your arms (e.g., wheelchair or bedside chair)?: A Little Help needed to walk in hospital room?: A Little Help needed climbing 3-5 steps with a railing? : A Lot 6 Click Score: 19    End of Session Equipment Utilized During Treatment: Oxygen Activity Tolerance: Patient tolerated treatment well Patient left: in chair;with call bell/phone within reach;with family/visitor present Nurse Communication: Mobility status PT Visit Diagnosis: Other abnormalities of gait and mobility (R26.89);Muscle weakness (generalized) (M62.81);Difficulty in walking, not elsewhere classified (R26.2);Pain Pain - part of body:  (back)    Time: 9629-5284 PT Time Calculation (min) (ACUTE ONLY): 17 min   Charges:   PT Evaluation $PT Eval Moderate Complexity: 1 Mod   PT General Charges $$  ACUTE PT VISIT: 1 Visit         Mylez Venable, PT, GCS 05/08/24,10:32 AM

## 2024-05-08 NOTE — Hospital Course (Signed)
 48yo with h/o asthma/COPD on 2L home O2 with remote h/o intubation, OSA on CPAP, chronic pain, and continued tobacco use who presented on 5/21 with SOB.  She was admitted with COPD exacerbation and started on steroids and antibiotics.

## 2024-05-08 NOTE — Discharge Summary (Signed)
 Physician Discharge Summary   Patient: Lindsay Harris MRN: [id number] DOB: [birth year]  Admit date:     05/07/2024  Discharge date: 05/08/24  Discharge Physician: Lorita Rosa   PCP: Macie Saxon, MD   Recommendations at discharge:   You are being discharged, as you requested Follow up with Dr. Lucina Sabal on 5/28 Take antibiotics (Levaquin once daily starting 5/23) until gone Take prednisone  40 mg daily for 3 more days and then taper by 10 mg every 3 days Follow up with Dr. Willene Harper in 1-2 weeks  Discharge Diagnoses: Principal Problem:   COPD exacerbation (HCC) Active Problems:   Tobacco dependence   Generalized anxiety disorder   High cholesterol   Sleep apnea   Chronic respiratory failure with hypoxia (HCC)   Obesity, Class III, BMI 40-49.9 (morbid obesity)    Hospital Course: 48yo with h/o asthma/COPD on 2L home O2 with remote h/o intubation, OSA on CPAP, chronic pain, and continued tobacco use who presented on 5/21 with SOB.  She was admitted with COPD exacerbation and started on steroids and antibiotics.  Assessment and Plan:  Acute COPD exacerbation with chronic hypoxic respiratory failure  SOB, exacerbated by ongoing tobacco use Admitted to progressive care -> telemetry Solumedrol IV bid -> po prednisone  daily x 3 days and then taper by 10 mg every 3 days Levaquin x 5 days, disgruntled when changed to PO and decided she wanted to be discharged CXR : NAD Continue Symbicort , Singulair Resume Spiriva   On home O2 Wants to discharge so will send her home Dr. Lucina Sabal will see her next Wednesday and is planning to start her on biologics   OSA  Continue CPAP at bedtime    Chronic pain Continue methocarbamol but not also cyclobenzaprine - should not be on 2 muscle relaxants simultaneously Continue gabapentin, duloxetine   Constipation Continue Linzess , lactulose    Anxiety Continue propranolol   Tobacco abuse Encourage cessation - she is quite proud that she  has decreased from 2.5 ppd to 10 cigs/day Nicotine  patch   HLD Continue rosuvastatin   Class 3 (morbid) obesity Body mass index is 42.39 kg/m.Aaron Aas  Weight loss should be encouraged Outpatient PCP/bariatric medicine f/u encouraged Significantly low or high BMI is associated with higher medical risk including morbidity and mortality        Consultants: PT OT   Procedures: None   Antibiotics: Levaquin 5/21-26  Pain control - [place]  Controlled Substance Reporting System database was reviewed. and patient was instructed, not to drive, operate heavy machinery, perform activities at heights, swimming or participation in water  activities or provide baby-sitting services while on Pain, Sleep and Anxiety Medications; until their outpatient Physician has advised to do so again. Also recommended to not to take more than prescribed Pain, Sleep and Anxiety Medications.   Disposition: Home Diet recommendation:  Regular diet DISCHARGE MEDICATION: Allergies as of 05/08/2024       Reactions   Amoxicillin Anaphylaxis, Hives        Medication List     STOP taking these medications    azithromycin 500 MG tablet Commonly known as: ZITHROMAX   cyclobenzaprine 5 MG tablet Commonly known as: FLEXERIL       TAKE these medications    AeroChamber MV inhaler Use as instructed   albuterol  (2.5 MG/3ML) 0.083% nebulizer solution Commonly known as: PROVENTIL  Take 2.5 mg by nebulization every 4 (four) hours as needed.   albuterol  108 (90 Base) MCG/ACT inhaler Commonly known as: VENTOLIN  HFA Inhale 2 puffs into the  lungs every 6 (six) hours as needed.   budesonide -formoterol  160-4.5 MCG/ACT inhaler Commonly known as: Symbicort  Inhale 2 puffs into the lungs in the morning and at bedtime.   DULoxetine 60 MG capsule Commonly known as: CYMBALTA Take 60 mg by mouth 2 (two) times daily.   EPINEPHrine 0.3 mg/0.3 mL Soaj injection Commonly known as: EPI-PEN Inject 0.3 mg into the  muscle as needed for anaphylaxis.   eszopiclone 1 MG Tabs tablet Commonly known as: LUNESTA Take 1 mg by mouth at bedtime as needed.   furosemide 20 MG tablet Commonly known as: LASIX Take 20 mg by mouth daily as needed.   gabapentin 100 MG capsule Commonly known as: NEURONTIN Take 400 mg by mouth 2 (two) times daily. Take 400mg  in the morning and 400mg  in the afternoon.   gabapentin 600 MG tablet Commonly known as: NEURONTIN Take 600 mg by mouth at bedtime.   lactulose  10 GM/15ML solution Commonly known as: CHRONULAC  Take 30 mLs (20 g total) by mouth daily.   levofloxacin 750 MG tablet Commonly known as: LEVAQUIN Take 1 tablet (750 mg total) by mouth daily for 3 doses. Start taking on: May 09, 2024   Linzess  290 MCG Caps capsule Generic drug: linaclotide  TAKE 1 CAPSULE BY MOUTH DAILY BEFORE BREAKFAST.   methocarbamol 500 MG tablet Commonly known as: ROBAXIN Take 1,000 mg by mouth daily.   montelukast 10 MG tablet Commonly known as: SINGULAIR Take 10 mg by mouth at bedtime.   nicotine  21 mg/24hr patch Commonly known as: NICODERM CQ  - dosed in mg/24 hours Place 1 patch (21 mg total) onto the skin daily. Start taking on: May 09, 2024   ondansetron  4 mg Tabs tablet Commonly known as: ZOFRAN  Take by mouth every 8 (eight) hours as needed.   Orilissa  200 MG Tabs Generic drug: Elagolix Sodium  Take 1 tablet (200 mg total) by mouth 2 (two) times daily.   pantoprazole  40 MG tablet Commonly known as: PROTONIX  Take 1 tablet (40 mg total) by mouth 2 (two) times daily before a meal.   predniSONE  10 MG tablet Commonly known as: DELTASONE  Take 4 tablets (40 mg total) by mouth daily with breakfast for 3 days, THEN 3 tablets (30 mg total) daily with breakfast for 3 days, THEN 2 tablets (20 mg total) daily with breakfast for 3 days, THEN 1 tablet (10 mg total) daily with breakfast for 3 days. Start taking on: May 09, 2024 What changed:  medication strength See the new  instructions.   propranolol ER 120 MG 24 hr capsule Commonly known as: INDERAL LA Take 120 mg by mouth daily.   rosuvastatin 20 MG tablet Commonly known as: CRESTOR Take 20 mg by mouth daily.   Simethicone 180 MG Caps Take 1 capsule by mouth 3 (three) times daily as needed.   Spiriva  HandiHaler 18 MCG inhalation capsule Generic drug: tiotropium Place 1 capsule (18 mcg total) into inhaler and inhale daily.   SUMAtriptan 25 MG tablet Commonly known as: IMITREX Take 25 mg by mouth every 2 (two) hours as needed.        Discharge Exam: Filed Weights   05/07/24 1340 05/07/24 2315  Weight: 109 kg 109.4 kg   See progress note from earlier today  Condition at discharge: improving  The results of significant diagnostics from this hospitalization (including imaging, microbiology, ancillary and laboratory) are listed below for reference.   Imaging Studies: DG Chest Portable 1 View Result Date: 05/07/2024 CLINICAL DATA:  Shortness of breath. EXAM: PORTABLE  CHEST 1 VIEW COMPARISON:  Apr 21, 2024. FINDINGS: The heart size and mediastinal contours are within normal limits. Hypoinflation of the lungs with minimal bibasilar subsegmental atelectasis. The visualized skeletal structures are unremarkable. IMPRESSION: Hypoinflation of the lungs with minimal bibasilar subsegmental atelectasis. Electronically Signed   By: Rosalene Colon M.D.   On: 05/07/2024 15:41   DG Swallowing Func-Speech Pathology Result Date: 04/21/2024 Table formatting from the original result was not included. Modified Barium Swallow Study Patient Details Name: Lindsay Harris MRN: [id number] Date of Birth: 10-09-1976 Today's Date: 04/21/2024 HPI/PMH: HPI: HIEN CUNLIFFE is a 49 yo female presenting to ED 5/4 with dysarthria and R sided numbness after an episode of unresponsiveness and shaking. Admitted with acute encephalopathy concerning for seizure with postictal state. MRI negative. PMH includes asthma, dyspnea, GERD,  migraines Clinical Impression: Pt presents with a functional oropharyngeal swallow. Oral transit and swallow initaition appeared more prompt than during initial evaluation and she achieves complete oropharyngeal clearance. Mastication is mildly prolonged secondary to edentulism, although pt states she rarely wears her dentures when eating. There were episodes of coughing that did not represent airway invasion. Of note, there is a prominent CP bar which minimally affects bolus flow through the PES. The 13 mm barium tablet was given with thin liquids and was [facility]. Recommend initiating diet of regular solids with thin liquids. No further SLP f/u is necessary acutely, will sign off. Factors that may increase risk of adverse event in presence of aspiration Roderick Civatte & Jessy Morocco 2021): No data recorded Recommendations/Plan: Swallowing Evaluation Recommendations Swallowing Evaluation Recommendations Recommendations: PO diet PO Diet Recommendation: Regular; Thin liquids (Level 0) Liquid Administration via: Cup; Straw Medication Administration: Whole meds with liquid Supervision: Patient able to self-feed Swallowing strategies  : Minimize environmental distractions; Slow rate; Small bites/sips Postural changes: Position pt fully upright for meals; Stay upright 30-60 min after meals Oral care recommendations: Oral care BID (2x/day) Treatment Plan Treatment Plan Treatment recommendations: No treatment recommended at this time Follow-up recommendations: No SLP follow up Recommendations Recommendations for follow up therapy are one component of a multi-disciplinary discharge planning process, led by the attending physician.  Recommendations may be updated based on patient status, additional functional criteria and insurance authorization. Assessment: Orofacial Exam: Orofacial Exam Oral Cavity: Oral Hygiene: WFL Oral Cavity - Dentition: Edentulous; Dentures, not available Orofacial Anatomy: WFL Oral Motor/Sensory Function: WFL  Anatomy: Anatomy: Prominent cricopharyngeus Boluses Administered: Boluses Administered Boluses Administered: Thin liquids (Level 0); Mildly thick liquids (Level 2, nectar thick); Moderately thick liquids (Level 3, honey thick); Solid  Oral Impairment Domain: Oral Impairment Domain Lip Closure: No labial escape Tongue control during bolus hold: Cohesive bolus between tongue to palatal seal Bolus preparation/mastication: Timely and efficient chewing and mashing Bolus transport/lingual motion: Brisk tongue motion Oral residue: Complete oral clearance Location of oral residue : N/A Initiation of pharyngeal swallow : Posterior laryngeal surface of the epiglottis  Pharyngeal Impairment Domain: Pharyngeal Impairment Domain Soft palate elevation: No bolus between soft palate (SP)/pharyngeal wall (PW) Laryngeal elevation: Complete superior movement of thyroid cartilage with complete approximation of arytenoids to epiglottic petiole Anterior hyoid excursion: Complete anterior movement Epiglottic movement: Complete inversion Laryngeal vestibule closure: Complete, no air/contrast in laryngeal vestibule Pharyngeal stripping wave : Present - complete Pharyngeal contraction (A/P view only): N/A Pharyngoesophageal segment opening: Partial distention/partial duration, partial obstruction of flow Tongue base retraction: No contrast between tongue base and posterior pharyngeal wall (PPW) Pharyngeal residue: Complete pharyngeal clearance Location of pharyngeal residue: N/A  Esophageal Impairment  Domain: Esophageal Impairment Domain Esophageal clearance upright position: Complete clearance, esophageal coating Pill: Pill Consistency administered: Thin liquids (Level 0) Thin liquids (Level 0): [facility] Penetration/Aspiration Scale Score: Penetration/Aspiration Scale Score 1.  Material does not enter airway: Thin liquids (Level 0); Mildly thick liquids (Level 2, nectar thick); Moderately thick liquids (Level 3, honey thick); Puree; Solid; Pill  Compensatory Strategies: Compensatory Strategies Compensatory strategies: Yes Straw: Effective Effective Straw: Thin liquid (Level 0); Mildly thick liquid (Level 2, nectar thick)   General Information: Caregiver present: No  Diet Prior to this Study: NPO   Temperature : Normal   Respiratory Status: WFL   Supplemental O2: Nasal cannula   History of Recent Intubation: No  Behavior/Cognition: Alert; Cooperative Self-Feeding Abilities: Able to self-feed Baseline vocal quality/speech: Normal Volitional Cough: Able to elicit Volitional Swallow: Able to elicit Exam Limitations: No limitations Goal Planning: Prognosis for improved oropharyngeal function: Good No data recorded No data recorded Patient/Family Stated Goal: none stated Consulted and agree with results and recommendations: Patient Pain: Pain Assessment Pain Assessment: No/denies pain End of Session: Start Time:SLP Start Time (ACUTE ONLY): 1152 Stop Time: SLP Stop Time (ACUTE ONLY): 1211 Time Calculation:SLP Time Calculation (min) (ACUTE ONLY): 19 min Charges: SLP Evaluations $ SLP Speech Visit: 1 Visit SLP Evaluations $BSS Swallow: 1 Procedure $MBS Swallow: 1 Procedure SLP visit diagnosis: SLP Visit Diagnosis: Dysphagia, oropharyngeal phase (R13.12) Past Medical History: Past Medical History: Diagnosis Date  Asthma   Dyspnea   GERD (gastroesophageal reflux disease)   Headache   migraines Past Surgical History: Past Surgical History: Procedure Laterality Date  COLONOSCOPY WITH PROPOFOL  N/A 08/09/2023  Procedure: COLONOSCOPY WITH BIOPSY;  Surgeon: Selena Daily, MD;  Location: [facility];  Service: Endoscopy;  Laterality: N/A;  POLYPECTOMY N/A 08/09/2023  Procedure: POLYPECTOMY;  Surgeon: Selena Daily, MD;  Location: [facility];  Service: Endoscopy;  Laterality: N/A; Amil Kale, M.A., CCC-SLP Speech Language Pathology, Acute Rehabilitation Services Secure Chat preferred [phone] 04/21/2024, 1:19 PM  EEG adult Result Date:  04/21/2024 Arleene Lack, MD     04/21/2024 11:51 AM Patient Name: Lindsay Harris MRN: [id number] Epilepsy Attending: Arleene Lack Referring Physician/Provider: Walton Guppy, MD Date: 04/21/2024 Duration: 22.13 mins Patient history:  48 y.o. female presenting with right-sided numbness and dysarthria after an episode of unresponsiveness and shaking. EEG to evaluate for seizure Level of alertness: Awake, asleep AEDs during EEG study: Ativan  Technical aspects: This EEG study was done with scalp electrodes positioned according to the 10-20 International system of electrode placement. Electrical activity was reviewed with band pass filter of 1-70Hz , sensitivity of 7 uV/mm, display speed of 63mm/sec with a 60Hz  notched filter applied as appropriate. EEG data were recorded continuously and digitally stored.  Video monitoring was available and reviewed as appropriate. Description: The posterior dominant rhythm consists of 9 Hz activity of moderate voltage (25-35 uV) seen predominantly in posterior head regions, symmetric and reactive to eye opening and eye closing. Sleep was characterized by vertex waves, sleep spindles (12 to 14 Hz), maximal frontocentral region. Hyperventilation and photic stimulation were not performed.   IMPRESSION: This study is within normal limits. No seizures or epileptiform discharges were seen throughout the recording. A normal interictal EEG does not exclude the diagnosis of epilepsy. Arleene Lack   DG Chest Port 1 View Result Date: 04/21/2024 CLINICAL DATA:  Shortness of breath EXAM: PORTABLE CHEST 1 VIEW COMPARISON:  07/23/2023 FINDINGS: Cardiac shadows within normal limits. Lungs are well aerated bilaterally. No focal infiltrate  or sizable effusion is seen. No bony abnormality is noted. IMPRESSION: No active disease. Electronically Signed   By: Violeta Grey M.D.   On: 04/21/2024 02:44   MR BRAIN WO CONTRAST Result Date: 04/21/2024 CLINICAL DATA:  Neuro deficit, acute, stroke  suspected EXAM: MRI HEAD WITHOUT CONTRAST TECHNIQUE: Multiplanar, multiecho pulse sequences of the brain and surrounding structures were obtained without intravenous contrast. COMPARISON:  None Available. FINDINGS: Limited stroke protocol per the ordering provider. Only DWI, FLAIR, and susceptibility weighted imaging was performed. Within this limitation: Brain: No acute infarction, hemorrhage, hydrocephalus, extra-axial collection or mass lesion. Vascular: Limited evaluation without T2 weighted imaging. Skull and upper cervical spine: Normal marrow signal. Sinuses/Orbits: No acute findings. IMPRESSION: No evidence of acute abnormality.  Limited stroke protocol. Electronically Signed   By: Stevenson Elbe M.D.   On: 04/21/2024 00:31   CT ANGIO HEAD NECK W WO CM (CODE STROKE) Result Date: 04/21/2024 CLINICAL DATA:  Stroke, follow up EXAM: CT ANGIOGRAPHY HEAD AND NECK WITH AND WITHOUT CONTRAST TECHNIQUE: Multidetector CT imaging of the head and neck was performed using the standard protocol during bolus administration of intravenous contrast. Multiplanar CT image reconstructions and MIPs were obtained to evaluate the vascular anatomy. Carotid stenosis measurements (when applicable) are obtained utilizing NASCET criteria, using the distal internal carotid diameter as the denominator. RADIATION DOSE REDUCTION: This exam was performed according to the departmental dose-optimization program which includes automated exposure control, adjustment of the mA and/or kV according to patient size and/or use of iterative reconstruction technique. CONTRAST:  75mL OMNIPAQUE  IOHEXOL  350 MG/ML SOLN COMPARISON:  CT head from today. FINDINGS: CTA NECK FINDINGS Aortic arch: Great vessel origins are patent without significant stenosis. Right carotid system: No evidence of dissection, stenosis (50% or greater), or occlusion. Left carotid system: No evidence of dissection, stenosis (50% or greater), or occlusion. Vertebral arteries:  Codominant. No evidence of dissection, stenosis (50% or greater), or occlusion. Skeleton: No acute abnormality on limited assessment. Other neck: No acute abnormality on limited assessment. Upper chest: Visualized lung apices are clear.  Emphysema. Review of the MIP images confirms the above findings CTA HEAD FINDINGS Anterior circulation: Bilateral intracranial ICAs, MCAs, and ACAs are patent without proximal hemodynamically significant stenosis. Posterior circulation: Bilateral intradural vertebral arteries, basilar artery and bilateral posterior short is are patent without proximal hemodynamically significant stenosis. Venous sinuses: As permitted by contrast timing, patent. Review of the MIP images confirms the above findings IMPRESSION: No emergent large vessel occlusion or proximal hemodynamically significant stenosis. Electronically Signed   By: Stevenson Elbe M.D.   On: 04/21/2024 00:04   CT HEAD CODE STROKE WO CONTRAST Result Date: 04/20/2024 CLINICAL DATA:  Code stroke.  Neuro deficit, acute, stroke suspected EXAM: CT HEAD WITHOUT CONTRAST TECHNIQUE: Contiguous axial images were obtained from the base of the skull through the vertex without intravenous contrast. RADIATION DOSE REDUCTION: This exam was performed according to the departmental dose-optimization program which includes automated exposure control, adjustment of the mA and/or kV according to patient size and/or use of iterative reconstruction technique. COMPARISON:  None Available. FINDINGS: Brain: No evidence of acute infarction, hemorrhage, hydrocephalus, extra-axial collection or mass lesion/mass effect. Partially empty sella. Vascular: No hyperdense vessel identified. Skull: No acute fracture. Sinuses/Orbits: Clear sinuses.  No acute orbital findings. Other: Small mastoid effusions. ASPECTS [facility] Stroke Program Early CT Score) Total score (0-10 with 10 being normal): 10. IMPRESSION: 1. No evidence of acute intracranial abnormality. 2.  ASPECTS is 10. Code stroke imaging results were communicated on  04/20/2024 at 11:57 pm to provider Dr. Bonnita Buttner via secure text paging. Electronically Signed   By: Stevenson Elbe M.D.   On: 04/20/2024 23:57    Microbiology: Results for orders placed or performed during the hospital encounter of 05/07/24  Resp panel by RT-PCR (RSV, Flu A&B, Covid) Anterior Nasal Swab     Status: None   Collection Time: 05/07/24  2:13 PM   Specimen: Anterior Nasal Swab  Result Value Ref Range Status   SARS Coronavirus 2 by RT PCR NEGATIVE NEGATIVE Final    Comment: (NOTE) SARS-CoV-2 target nucleic acids are NOT DETECTED.  The SARS-CoV-2 RNA is generally detectable in upper respiratory specimens during the acute phase of infection. The lowest concentration of SARS-CoV-2 viral copies this assay can detect is 138 copies/mL. A negative result does not preclude SARS-Cov-2 infection and should not be used as the sole basis for treatment or other patient management decisions. A negative result may occur with  improper specimen collection/handling, submission of specimen other than nasopharyngeal swab, presence of viral mutation(s) within the areas targeted by this assay, and inadequate number of viral copies(<138 copies/mL). A negative result must be combined with clinical observations, patient history, and epidemiological information. The expected result is Negative.  Fact Sheet for Patients:  BloggerCourse.com  Fact Sheet for Healthcare Providers:  SeriousBroker.it  This test is no t yet approved or cleared by the [place]  FDA and  has been authorized for detection and/or diagnosis of SARS-CoV-2 by FDA under an Emergency Use Authorization (EUA). This EUA will remain  in effect (meaning this test can be used) for the duration of the COVID-19 declaration under Section 564(b)(1) of the Act, 21 U.S.C.section 360bbb-3(b)(1), unless the authorization is  terminated  or revoked sooner.       Influenza A by PCR NEGATIVE NEGATIVE Final   Influenza B by PCR NEGATIVE NEGATIVE Final    Comment: (NOTE) The Xpert Xpress SARS-CoV-2/FLU/RSV plus assay is intended as an aid in the diagnosis of influenza from Nasopharyngeal swab specimens and should not be used as a sole basis for treatment. Nasal washings and aspirates are unacceptable for Xpert Xpress SARS-CoV-2/FLU/RSV testing.  Fact Sheet for Patients: BloggerCourse.com  Fact Sheet for Healthcare Providers: SeriousBroker.it  This test is not yet approved or cleared by the [place]  FDA and has been authorized for detection and/or diagnosis of SARS-CoV-2 by FDA under an Emergency Use Authorization (EUA). This EUA will remain in effect (meaning this test can be used) for the duration of the COVID-19 declaration under Section 564(b)(1) of the Act, 21 U.S.C. section 360bbb-3(b)(1), unless the authorization is terminated or revoked.     Resp Syncytial Virus by PCR NEGATIVE NEGATIVE Final    Comment: (NOTE) Fact Sheet for Patients: BloggerCourse.com  Fact Sheet for Healthcare Providers: SeriousBroker.it  This test is not yet approved or cleared by the [place]  FDA and has been authorized for detection and/or diagnosis of SARS-CoV-2 by FDA under an Emergency Use Authorization (EUA). This EUA will remain in effect (meaning this test can be used) for the duration of the COVID-19 declaration under Section 564(b)(1) of the Act, 21 U.S.C. section 360bbb-3(b)(1), unless the authorization is terminated or revoked.  Performed at [facility], [address] [place], [place] [address]   Respiratory (~20 pathogens) panel by PCR     Status: None   Collection Time: 05/07/24  4:39 PM   Specimen: Nasopharyngeal Swab; Respiratory  Result Value Ref Range Status   Adenovirus NOT  DETECTED  NOT DETECTED Final   Coronavirus 229E NOT DETECTED NOT DETECTED Final    Comment: (NOTE) The Coronavirus on the Respiratory Panel, DOES NOT test for the novel  Coronavirus (2019 nCoV)    Coronavirus HKU1 NOT DETECTED NOT DETECTED Final   Coronavirus NL63 NOT DETECTED NOT DETECTED Final   Coronavirus OC43 NOT DETECTED NOT DETECTED Final   Metapneumovirus NOT DETECTED NOT DETECTED Final   Rhinovirus / Enterovirus NOT DETECTED NOT DETECTED Final   Influenza A NOT DETECTED NOT DETECTED Final   Influenza B NOT DETECTED NOT DETECTED Final   Parainfluenza Virus 1 NOT DETECTED NOT DETECTED Final   Parainfluenza Virus 2 NOT DETECTED NOT DETECTED Final   Parainfluenza Virus 3 NOT DETECTED NOT DETECTED Final   Parainfluenza Virus 4 NOT DETECTED NOT DETECTED Final   Respiratory Syncytial Virus NOT DETECTED NOT DETECTED Final   Bordetella pertussis NOT DETECTED NOT DETECTED Final   Bordetella Parapertussis NOT DETECTED NOT DETECTED Final   Chlamydophila pneumoniae NOT DETECTED NOT DETECTED Final   Mycoplasma pneumoniae NOT DETECTED NOT DETECTED Final    Comment: Performed at [facility], [address] [place], [place] [address]    Labs: CBC: Recent Labs  Lab 05/07/24 1340 05/07/24 1639 05/08/24 0417  WBC 15.0* 19.3* 17.6*  NEUTROABS 10.0*  --   --   HGB 14.6 13.7 12.7  HCT 45.2 41.2 38.3  MCV 94.4 91.6 90.8  PLT 328 292 300   Basic Metabolic Panel: Recent Labs  Lab 05/07/24 1340 05/07/24 1639 05/08/24 0417  NA 139  --  136  K 3.8  --  3.8  CL 100  --  103  CO2 20*  --  23  GLUCOSE 199*  --  165*  BUN 9  --  12  CREATININE 0.92 0.79 0.84  CALCIUM 9.3  --  8.8*   Liver Function Tests: Recent Labs  Lab 05/07/24 1340  AST 45*  ALT 36  ALKPHOS 89  BILITOT 0.7  PROT 7.9  ALBUMIN 4.0   CBG: Recent Labs  Lab 05/07/24 1629 05/08/24 0016 05/08/24 0735 05/08/24 1115  GLUCAP 162* 217* 210* 261*    Discharge time spent: greater than 30  minutes.  Signed: Lorita Rosa, MD [facility] 05/08/2024

## 2024-05-08 NOTE — TOC Transition Note (Signed)
 Transition of Care [facility]) - Discharge Note   Patient Details  Name: Lindsay Harris MRN: [id number] Date of Birth: Apr 29, 1976  Transition of [facility]) CM/SW Contact:  Odilia Bennett, LCSW Phone Number: 05/08/2024, 5:00 PM   Clinical Narrative:  Patient has orders to discharge home today. CSW called patient, introduced role, and explained that PT recommendations would be discussed. She is agreeable to home health. [facility] and [facility] are unable to accept. [facility] has accepted for PT. Patient is aware and agreeable. No further concerns. CSW signing off.   Final next level of care: Home w Home Health Services Barriers to Discharge: No Barriers Identified   Patient Goals and CMS Choice     Choice offered to / list presented to : Patient      Discharge Placement                Patient to be transferred to facility by: Husband   Patient and family notified of of transfer: 05/08/24  Discharge Plan and Services Additional resources added to the After Visit Summary for                            [facility] Arranged: PT HH Agency: [facility] Date [facility] Agency Contacted: 05/08/24   Representative spoke with at [facility] Agency: [place]   Social Drivers of Health (SDOH) Interventions SDOH Screenings   Food Insecurity: Patient Declined (03/26/2024)   Received from [facility]  Housing: Patient Declined (03/26/2024)   Received from [facility]  Transportation Needs: Patient Declined (03/26/2024)   Received from [facility]  Utilities: Patient Declined (03/26/2024)   Received from [facility]  Depression [phone]): Low Risk  (09/24/2023)  Recent Concern: Depression (PHQ2-9) - High Risk (06/29/2023)  Financial Resource Strain: Patient Declined (03/26/2024)   Received from [facility]  Tobacco Use: High Risk (05/07/2024)     Readmission Risk Interventions     No data to display

## 2024-05-08 NOTE — Evaluation (Signed)
 Occupational Therapy Evaluation Patient Details Name: Lindsay Harris MRN: [id number] DOB: 02-04-1976 Today's Date: 05/08/2024   History of Present Illness   Lindsay Harris is a 48 y.o. 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 Patient reports living at home with husband and children. She is mod I at baseline with use of RW and utilizes 3Ls chronically. Pt has assistance with getting in and out of shower for bathing and family assists with IADLs. Pt is supervision overall with use of RW for mobility this session. She is motivated but does fatigue quickly during session.  Patient will benefit from acute OT to increase overall independence in the areas of ADLs, functional mobility, and safety awareness in order to safely discharge.     If plan is discharge home, recommend the following:   A little help with walking and/or transfers;Two people to help with walking and/or transfers;Assistance with cooking/housework;Assist for transportation;Help with stairs or ramp for entrance     Functional Status Assessment   Patient has had a recent decline in their functional status and demonstrates the ability to make significant improvements in function in a reasonable and predictable amount of time.     Equipment Recommendations   None recommended by OT     Recommendations for Other Services         Precautions/Restrictions   Precautions Precautions: Fall     Mobility Bed Mobility Overal bed mobility: Modified Independent                  Transfers Overall  transfer level: Modified independent Equipment used: Rolling walker (2 wheels)                      Balance Overall balance assessment: Mild deficits observed, not formally tested                                         ADL either performed or assessed with clinical judgement   ADL                                         General ADL Comments: supervision overall with use of RW     Vision Patient Visual Report: No change from baseline              Pertinent Vitals/Pain Pain Assessment Pain Assessment: Faces Faces Pain Scale: Hurts little more Pain Location: back Pain Descriptors / Indicators: Discomfort, Sore Pain Intervention(s): Monitored during session, Repositioned     Extremity/Trunk Assessment Upper Extremity Assessment Upper Extremity Assessment: Generalized weakness   Lower Extremity Assessment Lower Extremity Assessment: Generalized weakness   Cervical / Trunk Assessment Cervical / Trunk Assessment: Other exceptions Cervical / Trunk Exceptions: chronic back pain   Communication Communication Communication: No apparent difficulties   Cognition Arousal: Alert Behavior During Therapy: WFL for tasks assessed/performed Cognition: No apparent impairments  Following commands: Intact       Cueing  General Comments   Cueing Techniques: Verbal cues              Home Living Family/patient expects to be discharged to:: Private residence Living Arrangements: Spouse/significant other;Children Available Help at Discharge: Family;Available PRN/intermittently Type of Home: House Home Access: Stairs to enter [employer] of Steps: 5 Entrance Stairs-Rails: Right Home Layout: One level     Bathroom Shower/Tub: Tub/shower unit         Home Equipment: Agricultural consultant (2 wheels)          Prior Functioning/Environment Prior Level of Function :  Independent/Modified Independent;Driving             Mobility Comments: uses RW at all times, O2 at all times at baseline. ADLs Comments: Daughter assists as needed    OT Problem List: Decreased strength;Decreased safety awareness;Decreased activity tolerance;Impaired balance (sitting and/or standing);Decreased knowledge of precautions;Cardiopulmonary status limiting activity   OT Treatment/Interventions: Self-care/ADL training;Therapeutic activities;Therapeutic exercise;Energy conservation;Balance training;DME and/or AE instruction;Patient/family education      OT Goals(Current goals can be found in the care plan section)   Acute Rehab OT Goals Patient Stated Goal: to go home and feel better OT Goal Formulation: With patient Time For Goal Achievement: 05/22/24 Potential to Achieve Goals: Fair ADL Goals Pt Will Perform Grooming: with modified independence;standing Pt Will Perform Lower Body Dressing: with modified independence;sit to/from stand Pt Will Transfer to Toilet: with modified independence Pt Will Perform Toileting - Clothing Manipulation and hygiene: with modified independence   OT Frequency:  Min 2X/week       AM-PAC OT "6 Clicks" Daily Activity     Outcome Measure Help from another person eating meals?: None Help from another person taking care of personal grooming?: None Help from another person toileting, which includes using toliet, bedpan, or urinal?: A Little Help from another person bathing (including washing, rinsing, drying)?: A Little Help from another person to put on and taking off regular upper body clothing?: None Help from another person to put on and taking off regular lower body clothing?: A Little 6 Click Score: 21   End of Session Equipment Utilized During Treatment: Rolling walker (2 wheels) Nurse Communication: Mobility status  Activity Tolerance: Patient tolerated treatment well Patient left: in chair;with call bell/phone within  reach;with family/visitor present  OT Visit Diagnosis: Muscle weakness (generalized) (M62.81)                Time: 9604-5409 OT Time Calculation (min): 17 min Charges:  OT General Charges $OT Visit: 1 Visit OT Evaluation $OT Eval Low Complexity: 1 Low  George Kinder, MS, OTR/L , CBIS ascom [phone]  05/08/24, 1:13 PM

## 2024-05-08 NOTE — Progress Notes (Signed)
 Progress Note   Patient: Lindsay Harris WUJ:[id number] DOB: 1976-08-09 DOA: 05/07/2024     1 DOS: the patient was seen and examined on 05/08/2024   Brief hospital course: 48yo with h/o asthma/COPD on 2L home O2 with remote h/o intubation, OSA on CPAP, chronic pain, and continued tobacco use who presented on 5/21 with SOB.  She was admitted with COPD exacerbation and started on steroids and antibiotics.  Assessment and Plan:  Acute COPD exacerbation with chronic hypoxic respiratory failure  SOB, exacerbated by ongoing tobacco use Admitted to progressive care -> telemetry Solumedrol IV bid -> po prednisone  daily Levaquin x 5 days F/u on sputum cultures  Consider further imaging of chest if patient does not improve CXR : NAD Nebs standing and prn  Continue Symbicort , Singulair Hold Spiriva  (on Duonebs q6h for now) Pulmonary toilet  Wean O2 as able     OSA  Continue CPAP at bedtime    Chronic pain Continue methocarbamol but not also cyclobenzaprine - should not be on 2 muscle relaxants simultaneously Continue gabapentin, duloxetine  Constipation Continue Linzess , lactulose    Anxiety Continue propranolol   Tobacco abuse Encourage cessation - she is quite proud that she has decreased from 2.5 ppd to 10 cigs/day Nicotine  patch  HLD Continue rosuvastatin  Class 3 (morbid) obesity Body mass index is 42.39 kg/m.Aaron Aas  Weight loss should be encouraged Outpatient PCP/bariatric medicine f/u encouraged Significantly low or high BMI is associated with higher medical risk including morbidity and mortality     Consultants: PT OT  Procedures: None  Antibiotics: Levaquin 5/21-26  30 Day Unplanned Readmission Risk Score    Flowsheet Row ED to Hosp-Admission (Current) from 05/07/2024 in [facility]  30 Day Unplanned Readmission Risk Score (%) 12.7 Filed at 05/08/2024 0801       This score is the patient's risk of an unplanned readmission within 30  days of being discharged (0 -100%). The score is based on dignosis, age, lab data, medications, orders, and past utilization.   Low:  0-14.9   Medium: 15-21.9   High: 22-29.9   Extreme: 30 and above           Subjective: Felt anxious earlier when her home O2 was turned down, still anxious but improving.  Breathing is improved.   Objective: Vitals:   05/08/24 1354 05/08/24 1410  BP:  129/67  Pulse:  100  Resp:    Temp:    SpO2: 100% 98%    Intake/Output Summary (Last 24 hours) at 05/08/2024 1457 Last data filed at 05/08/2024 0300 Gross per 24 hour  Intake 541.84 ml  Output --  Net 541.84 ml   Filed Weights   05/07/24 1340 05/07/24 2315  Weight: 109 kg 109.4 kg    Exam:  General:  Appears calm and comfortable and is in NAD, on 3L (home) O2 Eyes:  normal lids, iris ENT:  grossly normal hearing, lips & tongue, mmm Cardiovascular:  RRR, no m/r/g. No LE edema.  Respiratory:  Diffuse wheezing with moderate air movement.  Normal respiratory effort. Abdomen:  soft, NT, ND Skin:  no rash or induration seen on limited exam Musculoskeletal:  grossly normal tone BUE/BLE, good ROM, no bony abnormality Psychiatric:  mildly anxious mood and affect, speech fluent and appropriate, AOx3 Neurologic:  CN 2-12 grossly intact, moves all extremities in coordinated fashion  Data Reviewed: I have reviewed the patient's lab results since admission.  Pertinent labs for today include:   Glucose 165 WBC 17.6  COVID/flu/RSV negative RVP negative    Family Communication: Family member was present throughout evaluation  Disposition: Status is: Inpatient Remains inpatient appropriate because: ongoing monitoring     Time spent: 50 minutes  Unresulted Labs (From admission, onward)     Start     Ordered   05/09/24 0500  CBC with Differential/Platelet  Tomorrow morning,   R        05/08/24 1457   05/09/24 0500  Basic metabolic panel with GFR  Tomorrow morning,   R        05/08/24  1457   05/07/24 1511  Expectorated Sputum Assessment w Gram Stain, Rflx to Resp Cult  Once,   R        05/07/24 1512             Author: Lorita Rosa, MD 05/08/2024 2:57 PM  For on call review [email or web address].

## 2024-05-08 NOTE — Plan of Care (Signed)
   Problem: Education: Goal: Knowledge of General Education information will improve Description Including pain rating scale, medication(s)/side effects and non-pharmacologic comfort measures Outcome: Progressing

## 2024-05-08 NOTE — Progress Notes (Signed)
 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 She has improved on IV steroids and scheduled Duonebs. Unclear instigating factor but most likely ongoing tobacco use, extended viral panel negative.   When ready for discharge recommend prolonged Steroid taper of 40mg  Prednisone  for 3 days and taper by 10mg  every 3 days. She will need to go back on her triple inhaler therapy Symbicort  and Spiriva  upon discharge.   I am planning to start her on biologics as outpatient and will arrange a follow up visit next week Wednesday.   [name], MD [facility] 05/08/2024 3:19 PM

## 2024-05-09 ENCOUNTER — Telehealth: Payer: Self-pay

## 2024-05-09 DIAGNOSIS — J439 Emphysema, unspecified: Secondary | ICD-10-CM

## 2024-05-09 NOTE — Telephone Encounter (Signed)
 Patient was seen in the office on 05/06/2024. Dr. Assaker has ordered Dupixent for the patient.  She has signed the form and it has been faxed to the pharmacy team for completion.

## 2024-05-09 NOTE — Telephone Encounter (Signed)
 Noted. Nothing further needed.

## 2024-05-09 NOTE — Telephone Encounter (Signed)
 Received Dupixent new start paperwork via [employer] for COPD  Submitted a Prior Authorization request to [facility] for DUPIXENT via CoverMyMeds. Will update once we receive a response.  Key: [id number]

## 2024-05-09 NOTE — Telephone Encounter (Signed)
 Received. Will start Dupixent benefits investigation

## 2024-05-14 ENCOUNTER — Encounter: Payer: Self-pay | Admitting: Pulmonary Disease

## 2024-05-14 ENCOUNTER — Ambulatory Visit (INDEPENDENT_AMBULATORY_CARE_PROVIDER_SITE_OTHER): Admitting: Pulmonary Disease

## 2024-05-14 VITALS — BP 110/70 | HR 78 | Temp 98.9°F | Ht 63.25 in | Wt 242.8 lb

## 2024-05-14 DIAGNOSIS — J4489 Other specified chronic obstructive pulmonary disease: Secondary | ICD-10-CM

## 2024-05-14 DIAGNOSIS — Z87891 Personal history of nicotine dependence: Secondary | ICD-10-CM

## 2024-05-14 DIAGNOSIS — J439 Emphysema, unspecified: Secondary | ICD-10-CM

## 2024-05-14 MED ORDER — NICOTINE 14 MG/24HR TD PT24: 14.0000 mg | MEDICATED_PATCH | Freq: Every day | TRANSDERMAL | 0 refills | Status: DC

## 2024-05-14 MED ORDER — NICOTINE POLACRILEX 2 MG MT LOZG: 2.0000 mg | LOZENGE | OROMUCOSAL | 4 refills | Status: DC | PRN

## 2024-05-14 MED ORDER — AZITHROMYCIN 250 MG PO TABS: 250.0000 mg | ORAL_TABLET | Freq: Every day | ORAL | 0 refills | Status: DC

## 2024-05-16 ENCOUNTER — Encounter: Payer: Self-pay | Admitting: Pulmonary Disease

## 2024-05-16 NOTE — Progress Notes (Signed)
 Synopsis: Referred in by Macie Saxon, MD   Subjective:   PATIENT ID: Lindsay Harris: female DOB: August 16, 1976, MRN: [id number]  Chief Complaint  Patient presents with   Follow-up    Wheezing. SOB. Cough with light yellow sputum. Chest pressure.     HPI Lindsay Harris is a 48 y.o female patient with a past medical history of severe persistent asthma on triple therapy presenting to the pulmonary clinic today for follow up on her asthma/COPD overlap  I last saw earlier in August 2024 for asthma and she seemed to be in an asthma exacerbation. Referred to the ED and was managed with IV steroids and discharged on 5 days prednisone . She did better and presents today for a follow up visit.   She has had asthma for the past 32 years with multiple exacerbations one of them at age 75 requiring intubation.   Labs:  EOS 300 2024  Total IgE 136 and Allergen panel is underwhelming mostly negative and briefly + for cockroach.   PFTs 2024 - Obstruction Gold Stage II, air trapping and decreased DLCO.   FH: Strong family history of asthma in her mother and grandmother.   SH: Active smoker, smokes 1 ppd for 30 years, no alcohol use and no illicit drug use. Has 1 Dog at home.   OV 05/06/2024 - Here for follow up on her COPD/Asthma overlap. Appears to be in severe COPD/Asthma exacerbation in office. Provided subcutaneous Methylpred 120mg  and duonebs in office and referred home to continue prednisone  and zypec with clear instructions that should she not feel any better to head to the ED. Unfortunately she had to be admitted the following day. This is her second severe exacerbation in 8 months. Besides Smoking cessation I am afraid we have to step up care to Biologics. Plan is to start her on dupixent.   OV 05/14/2024 - Lindsay Harris is here to follow up regarding her COPD/Asthma exacerbation and hospitalization. She is doing better than last week but still with significant chest tightness and wheezing. I  recommended her using her duonebs Q6H around the clock for the next 7 days. She is to complete her steroid taper and will add azithromycin for the next 7 days. She is planned to start Dupixent as well.    ROS All systems were reviewed and are negative except for the above.  Objective:   Vitals:   05/14/24 1520  BP: 110/70  Pulse: 78  Temp: 98.9 F (37.2 C)  SpO2: 98%  Weight: 242 lb 12.8 oz (110.1 kg)  Height: 5' 3.25" (1.607 m)   98% on RA BMI Readings from Last 3 Encounters:  05/14/24 42.67 kg/m  05/07/24 42.39 kg/m  05/06/24 40.66 kg/m   Wt Readings from Last 3 Encounters:  05/14/24 242 lb 12.8 oz (110.1 kg)  05/07/24 241 lb 2.9 oz (109.4 kg)  05/06/24 240 lb 9.6 oz (109.1 kg)    Physical Exam GEN: Moderate respiratory distress with accessory muscle use.  HEENT: Supple Neck, Reactive Pupils, EOMI  CVS: Normal S1, Normal S2, RRR, No murmurs or ES appreciated  Lungs: diffuse wheezing. No rales/ronchi.   Abdomen: Soft, non tender, non distended, + BS  Extremities: Warm and well perfused, No edema  Skin: No suspicious lesions appreciated  Psych: Good affect.   Ancillary Information   CBC    Component Value Date/Time   WBC 17.6 (H) 05/08/2024 0417   RBC 4.22 05/08/2024 0417   HGB 12.7 05/08/2024 0417   HCT 38.3  05/08/2024 0417   PLT 300 05/08/2024 0417   MCV 90.8 05/08/2024 0417   MCH 30.1 05/08/2024 0417   MCHC 33.2 05/08/2024 0417   RDW 15.5 05/08/2024 0417   LYMPHSABS 4.2 (H) 05/07/2024 1340   MONOABS 0.6 05/07/2024 1340   EOSABS 0.1 05/07/2024 1340   BASOSABS 0.1 05/07/2024 1340        No data to display         Labs and imaging were reviewed.   Assessment & Plan:  Lindsay Harris is a 48 y.o female patient with a past medical history of severe persistent asthma on triple therapy presenting to the pulmonary clinic today for further management of her asthma.   #Severe COPD Gold stage II with Chronic hypoxic respiratory failure on 2lpm #Asthma overlap   #Acute exacerbation unclear reason (Prior exacerbation 07/2023)   EOS 300  Total IgE 136  walk test 09/24/2023 670feet  A1AT normal.   []  c/w budesonide -formoterol  [Symbicort ] 160-4.5 2puffs BID. And Spiriva  daily.  []  Albuterol  as needed. []  Duonebs, prednisone  taper and azithromycin for the next 7 days.  []  CT chest wo contrast to evaluate parenchymal lung disease and abnormal CXR with hypoinflation.   []  Refer back to pulmonary rehab (Had to pause for a period of time)  []  Will start her on biologics.  []  Will need Echocardiogram for evaluation of PH.   #Mild OSA (AHI 6.8 2024) Reports lound snoring and apneic episodes. Overweight and increased neck circumference with extreme fatigue during the day. Significant Daytime sleepiness and multiple naps during the day.    []  c/w Auto CPAP 5-15 with 2L O2  []  Resmed Report reviewed and compliance is adequate >= 40%. AHI improved to less than 5. Patient feels significantly better with imprved daily fatigue.   #Tobacco use disorder  Discussed the importance of smoking cessation and dramatic impact on copd progression. She is ready to stop and has decided on Monday next week. Provided NRT.   RTC 4 weeks.   I spent minutes caring for this patient today, including preparing to see the patient, obtaining a medical history , reviewing a separately obtained history, performing a medically appropriate examination and/or evaluation, documenting clinical information in the electronic health record, and independently interpreting results (not separately reported/billed) and communicating results to the patient/family/caregiver  Lindsay Kindler, MD [facility] 05/16/2024 10:21 AM

## 2024-05-22 NOTE — Telephone Encounter (Signed)
 Received a fax regarding Prior Authorization from [facility] for DUPIXENT. Authorization has been DENIED because must confirm that patient will not use Dupixent for relief of acute bronchospasm nor in combination with other asthma biologics.  Submitted an URGENT appeal to [employer] for DUPIXENT.  Reference # [id number] Phone: [phone] Fax: [phone]   Geraldene Kleine, PharmD, MPH, BCPS, CPP Clinical Pharmacist (Rheumatology and Pulmonology)

## 2024-05-23 NOTE — Telephone Encounter (Signed)
 Will await fax  Copied from CRM [id number]. Topic: Clinical - Prescription Issue >> May 23, 2024 10:48 AM Juliana Ocean wrote: Reason for CRM: Peterson Brandt w/ the appeals dept for [employer], states the have approved the Rx    DUPIXENT .  She is going to fax something right now to you as well w/ this information.

## 2024-05-28 ENCOUNTER — Other Ambulatory Visit (HOSPITAL_COMMUNITY): Payer: Self-pay

## 2024-05-28 MED ORDER — DUPIXENT 300 MG/2ML ~~LOC~~ SOAJ
300.0000 mg | SUBCUTANEOUS | 0 refills | Status: DC
  Filled 2024-05-30: qty 4, 28d supply, fill #0

## 2024-05-28 NOTE — Telephone Encounter (Signed)
 Received notification from [facility] regarding a prior authorization for DUPIXENT. Authorization has been APPROVED from 05/09/24 to 11/09/24. Approval letter sent to scan center.  Per test claim, copay for 28 days supply is $4  Patient can fill through [facility]: [phone]   Authorization # [id number]  Rx sent to [facility] to be couriered to clinic before new start appt on 06/04/24  Sanders Manninen, PharmD, MPH, BCPS, CPP Clinical Pharmacist (Rheumatology and Pulmonology)

## 2024-05-30 ENCOUNTER — Other Ambulatory Visit (HOSPITAL_COMMUNITY): Payer: Self-pay

## 2024-05-30 ENCOUNTER — Other Ambulatory Visit: Payer: Self-pay

## 2024-05-30 NOTE — Progress Notes (Signed)
 Specialty Pharmacy Initial Fill Coordination Note  Lindsay Harris is a 48 y.o. female contacted today regarding initial fill of specialty medication(s) Dupilumab  (Dupixent )   Patient requested Courier to Provider Office   Delivery date: 06/03/24   Verified address: [address] [place], [place] [address]   Medication will be filled on 06/02/24.   Patient is aware of $4 copayment. She wishes to have copay billed to [facility] account.

## 2024-06-02 ENCOUNTER — Other Ambulatory Visit: Payer: Self-pay

## 2024-06-04 ENCOUNTER — Ambulatory Visit: Admitting: Pulmonary Disease

## 2024-06-04 ENCOUNTER — Other Ambulatory Visit: Admitting: Pharmacist

## 2024-06-04 ENCOUNTER — Ambulatory Visit (INDEPENDENT_AMBULATORY_CARE_PROVIDER_SITE_OTHER): Admitting: Pharmacist

## 2024-06-04 ENCOUNTER — Ambulatory Visit
Admission: RE | Admit: 2024-06-04 | Discharge: 2024-06-04 | Disposition: A | Discharge status: Home / Self Care | Source: Ambulatory Visit | Attending: Family Medicine | Admitting: Family Medicine

## 2024-06-04 DIAGNOSIS — J4489 Other specified chronic obstructive pulmonary disease: Secondary | ICD-10-CM

## 2024-06-04 DIAGNOSIS — J439 Emphysema, unspecified: Secondary | ICD-10-CM | POA: Diagnosis present

## 2024-06-04 DIAGNOSIS — J4550 Severe persistent asthma, uncomplicated: Secondary | ICD-10-CM

## 2024-06-04 DIAGNOSIS — Z7189 Other specified counseling: Secondary | ICD-10-CM

## 2024-06-04 DIAGNOSIS — J4551 Severe persistent asthma with (acute) exacerbation: Secondary | ICD-10-CM

## 2024-06-04 MED ORDER — DUPIXENT 300 MG/2ML ~~LOC~~ SOAJ
300.0000 mg | SUBCUTANEOUS | 1 refills | Status: DC
  Filled 2024-06-09: qty 12, 84d supply, fill #0
  Filled 2024-06-23: qty 4, 28d supply, fill #0
  Filled 2024-07-30 (×2): qty 4, 28d supply, fill #1
  Filled 2024-08-25: qty 4, 28d supply, fill #2
  Filled 2024-09-26: qty 4, 28d supply, fill #3
  Filled 2024-10-20: qty 4, 28d supply, fill #4
  Filled 2024-11-25 – 2024-11-27 (×2): qty 4, 28d supply, fill #5

## 2024-06-04 NOTE — Patient Instructions (Addendum)
 Your next DUPIXENT  dose is due on 06/18/24, 07/02/24, and every 14 days thereafter  CONTINUE Symbicort  2 puffs twice daily and Spiriva  once daily and montelukast  10mg  nightly. Try using the nicotine  patch with adhesive bandage support  Your prescription will be shipped from [facility]. Their phone number is [phone] They will call to schedule shipment and confirm address. They will mail your medication to your home.  You will need to be seen by your provider in 3 to 4 months to assess how DUPIXENT  is working for you.  Please ensure you have a follow-up appointment scheduled in September or October 2025.  Call our clinic if you need to make this appointment.  Stay up to date on all routine vaccines: influenza, pneumonia, COVID19, Shingles  How to manage an injection site reaction: Remember the 5 C's: COUNTER - leave on the counter at least 30 minutes but up to overnight to bring medication to room temperature. This may help prevent stinging COLD - place something cold (like an ice gel pack or cold water  bottle) on the injection site just before cleansing with alcohol. This may help reduce pain CLARITIN - use Claritin (generic name is loratadine) for the first two weeks of treatment or the day of, the day before, and the day after injecting. This will help to minimize injection site reactions CORTISONE CREAM - apply if injection site is irritated and itching CALL ME - if injection site reaction is bigger than the size of your fist, looks infected, blisters, or if you develop hives

## 2024-06-04 NOTE — Progress Notes (Addendum)
 HPI Patient presents today to [facility] to see pharmacy team for Dupixent  new start for asthma-COPD overlap. Accompanied by her daughter today who is willing to help with injections if needed. Patient herself is naive to self-administering injectable medications  Recent hospitalization on 05/07/24 for exacerbation. She has a repeat chest CT today.  She reports that she is back up to smoking 1ppd.The nicotine  patches do not remain adherent to her skin   Respiratory Medications Current regimen: Spiriva  18mcg once daily, Symbicort  160-4.5mcg (2 puffs twice daily), montelukast  10mg  nightly Patient reports no known adherence challenges  OBJECTIVE Allergies  Allergen Reactions   Amoxicillin Anaphylaxis and Hives    Outpatient Encounter Medications as of 06/04/2024  Medication Sig Note   albuterol  (PROVENTIL ) (2.5 MG/3ML) 0.083% nebulizer solution Take 2.5 mg by nebulization every 4 (four) hours as needed. 05/07/2024: prn   albuterol  (VENTOLIN  HFA) 108 (90 Base) MCG/ACT inhaler Inhale 2 puffs into the lungs every 6 (six) hours as needed. 05/07/2024: prn   azithromycin  (ZITHROMAX ) 250 MG tablet Take 1 tablet (250 mg total) by mouth daily.    budesonide -formoterol  (SYMBICORT ) 160-4.5 MCG/ACT inhaler Inhale 2 puffs into the lungs in the morning and at bedtime.    DULoxetine  (CYMBALTA ) 60 MG capsule Take 60 mg by mouth 2 (two) times daily.    Dupilumab  (DUPIXENT ) 300 MG/2ML SOAJ Inject 300 mg into the skin every 14 (fourteen) days. Courier to pulm: [address], [address], [place] [address]. Appt on 06/04/24    Elagolix Sodium  (ORILISSA ) 200 MG TABS Take 1 tablet (200 mg total) by mouth 2 (two) times daily.    EPINEPHrine 0.3 mg/0.3 mL IJ SOAJ injection Inject 0.3 mg into the muscle as needed for anaphylaxis. 05/07/2024: prn   eszopiclone (LUNESTA) 1 MG TABS tablet Take 1 mg by mouth at bedtime as needed. 05/07/2024: prn   furosemide (LASIX) 20 MG tablet Take 20 mg by mouth daily as  needed.    gabapentin  (NEURONTIN ) 100 MG capsule Take 400 mg by mouth 2 (two) times daily. Take 400mg  in the morning and 400mg  in the afternoon.    gabapentin  (NEURONTIN ) 600 MG tablet Take 600 mg by mouth at bedtime.    lactulose  (CHRONULAC ) 10 GM/15ML solution Take 30 mLs (20 g total) by mouth daily.    linaclotide  (LINZESS ) 290 MCG CAPS capsule TAKE 1 CAPSULE BY MOUTH DAILY BEFORE BREAKFAST.    methocarbamol  (ROBAXIN ) 500 MG tablet Take 1,000 mg by mouth daily. 05/07/2024: prn   montelukast  (SINGULAIR ) 10 MG tablet Take 10 mg by mouth at bedtime.    nicotine  (NICODERM CQ  - DOSED IN MG/24 HOURS) 14 mg/24hr patch Place 1 patch (14 mg total) onto the skin daily.    nicotine  (NICODERM CQ  - DOSED IN MG/24 HOURS) 21 mg/24hr patch Place 1 patch (21 mg total) onto the skin daily.    nicotine  polacrilex (NICOTINE  MINI) 2 MG lozenge Take 1 lozenge (2 mg total) by mouth as needed for smoking cessation.    ondansetron  (ZOFRAN ) 4 mg TABS tablet Take by mouth every 8 (eight) hours as needed. 05/07/2024: prn   pantoprazole  (PROTONIX ) 40 MG tablet Take 1 tablet (40 mg total) by mouth 2 (two) times daily before a meal.    propranolol  ER (INDERAL  LA) 120 MG 24 hr capsule Take 120 mg by mouth daily.    rosuvastatin  (CRESTOR ) 20 MG tablet Take 20 mg by mouth daily.    Simethicone 180 MG CAPS Take 1 capsule by mouth 3 (three)  times daily as needed. 05/07/2024: prn   Spacer/Aero-Holding Chambers (AEROCHAMBER MV) inhaler Use as instructed    SPIRIVA  HANDIHALER 18 MCG inhalation capsule Place 1 capsule (18 mcg total) into inhaler and inhale daily.    SUMAtriptan (IMITREX) 25 MG tablet Take 25 mg by mouth every 2 (two) hours as needed. 05/07/2024: prn   No facility-administered encounter medications on file as of 06/04/2024.      There is no immunization history on file for this patient.   PFTs     No data to display          Eosinophils Most recent blood eosinophil count was 100 cells/microL taken on  05/07/2024; 300 on 04/20/2024.   IgE: 136 on 08/06/2023  Assessment   Biologics training for dupilumab  (Dupixent )  Goals of therapy: Mechanism: human monoclonal IgG4 antibody that inhibits interleukin-4 and interleukin-13 cytokine-induced responses, including release of proinflammatory cytokines, chemokines, and IgE Reviewed that Dupixent  is add-on medication and patient must continue maintenance inhaler regimen. Response to therapy: may take 4 months to determine efficacy. Discussed that patients generally feel improvement sooner than 4 months.  Side effects: injection site reaction (6-18%), antibody development (5-16%), ophthalmic conjunctivitis (2-16%), transient blood eosinophilia (1-2%)  Dose: 600mg  at Week 0 (administered today in clinic) followed by 300mg  every 14 days thereafter  Administration/Storage:  Reviewed administration sites of thigh or abdomen (at least 2-3 inches away from abdomen). Reviewed the upper arm is only appropriate if caregiver is administering injection  Do not shake pen/syringe as this could lead to product foaming or precipitation. Do not use if solution is discolored or contains particulate matter or if window on prefilled pen is yellow (indicates pen has been used).  Reviewed storage of medication in refrigerator. Reviewed that Dupixent  can be stored at room temperature in unopened carton for up to 14 days.  Access: Approval of Dupixent  through: insurance  Patient self-administered Dupixent  300mg /50ml x 2 (total dose 600mg ) in right lower abdomen and left lower abdomen using sample Dupixent  300mg /11mL autoinjector pen NDC: [phone] Lot: [id number] Expiration: 07/17/2026  Patient monitored for 30 minutes for adverse reaction.  Patient tolerated well.  Injection site noted. Patient denies itchiness and irritation at injection., No swelling or redness noted., and Reviewed injection site reaction management with patient verbally and printed information for  review in AVS  Medication Reconciliation  A drug regimen assessment was performed, including review of allergies, interactions, disease-state management, dosing and immunization history. Medications were reviewed with the patient, including name, instructions, indication, goals of therapy, potential side effects, importance of adherence, and safe use.  Drug interaction(s): none noted  PLAN Continue Dupixent  300mg  every 14 days.  Next dose is due 06/18/24 and every 14 days thereafter. Rx sent to: [facility]: [phone] .  Patient provided with pharmacy phone number and advised that Alwin will call later to schedule shipment to home. Continue maintenance inhaler regimen of: Spiriva  18mcg once daily, Symbicort  160-4.5mcg (2 puffs twice daily), montelukast  10mg  nightly Thank you for willing to re-challenge nicotine  patch. She will use wrap to try to keep patches adherent to skin. She confirms that she has used multiple patch application sites, areas are lotion free and dry skin areas  All questions encouraged and answered.  Instructed patient to reach out with any further questions or concerns.  Thank you for allowing pharmacy to participate in this patient's care.  This appointment required 45 minutes of patient care (this includes precharting, chart review, review of results, face-to-face care, etc.).  Sherry Pennant, PharmD, MPH, BCPS, CPP Clinical Pharmacist (Rheumatology and Pulmonology)

## 2024-06-05 ENCOUNTER — Other Ambulatory Visit: Payer: Self-pay

## 2024-06-06 NOTE — Progress Notes (Signed)
 Patient started Dupixent  for asthma-COPD overlap on 06/04/2024. Accompanied by her daughter who is willing to help with injections if needed. Patient herself is naive to self-administering injectable medications   Recent hospitalization on 05/07/24 for exacerbation. She has a repeat chest CT today.   She reports that she is back up to smoking 1ppd.The nicotine  patches do not remain adherent to her skin   PLAN Continue Dupixent  300mg  every 14 days.  Next dose is due 06/18/24 and every 14 days thereafter. Rx sent to: [facility]: [phone] .  Patient provided with pharmacy phone number and advised that Demaris Fillers will call later to schedule shipment to home. Continue maintenance inhaler regimen of: Spiriva  18mcg once daily, Symbicort  160-4.5mcg (2 puffs twice daily), montelukast  10mg  nightly Thank you for willing to re-challenge nicotine  patch. She will use wrap to try to keep patches adherent to skin. She confirms that she has used multiple patch application sites, areas are lotion free and dry skin areas  Geraldene Kleine, PharmD, MPH, BCPS, CPP Clinical Pharmacist (Rheumatology and Pulmonology)

## 2024-06-09 ENCOUNTER — Other Ambulatory Visit: Payer: Self-pay

## 2024-06-11 ENCOUNTER — Encounter: Payer: Self-pay | Admitting: Pulmonary Disease

## 2024-06-11 ENCOUNTER — Ambulatory Visit (INDEPENDENT_AMBULATORY_CARE_PROVIDER_SITE_OTHER): Admitting: Pulmonary Disease

## 2024-06-11 ENCOUNTER — Encounter: Payer: Self-pay | Admitting: *Deleted

## 2024-06-11 VITALS — BP 118/72 | HR 86 | Temp 97.1°F | Ht 63.25 in | Wt 243.6 lb

## 2024-06-11 DIAGNOSIS — G8929 Other chronic pain: Secondary | ICD-10-CM | POA: Diagnosis not present

## 2024-06-11 DIAGNOSIS — M5441 Lumbago with sciatica, right side: Secondary | ICD-10-CM

## 2024-06-11 NOTE — Progress Notes (Signed)
 Synopsis: Referred in by [place] HERO, MD   Subjective:   PATIENT ID: Lindsay Harris: female DOB: 1975/12/27, MRN: [id number]  Chief Complaint  Patient presents with   Follow-up    SOB. Wheezing. Cough.    HPI Lindsay Harris is a 48 y.o female patient with a past medical history of severe persistent asthma on triple therapy presenting to the pulmonary clinic today for follow up on her asthma/COPD overlap  I last saw earlier in August 2024 for asthma and she seemed to be in an asthma exacerbation. Referred to the ED and was managed with IV steroids and discharged on 5 days prednisone . She did better and presents today for a follow up visit.   She has had asthma for the past 32 years with multiple exacerbations one of them at age 11 requiring intubation.   Labs:  EOS 300 2024  Total IgE 136 and Allergen panel is underwhelming mostly negative and briefly + for cockroach.   PFTs 2024 - Obstruction Gold Stage II, air trapping and decreased DLCO.   FH: Strong family history of asthma in her mother and grandmother.   SH: Active smoker, smokes 1 ppd for 30 years, no alcohol use and no illicit drug use. Has 1 Dog at home.   OV 05/06/2024 - Here for follow up on her COPD/Asthma overlap. Appears to be in severe COPD/Asthma exacerbation in office. Provided subcutaneous Methylpred 120mg  and duonebs in office and referred home to continue prednisone  and zypec with clear instructions that should she not feel any better to head to the ED. Unfortunately she had to be admitted the following day. This is her second severe exacerbation in 8 months. Besides Smoking cessation I am afraid we have to step up care to Biolog[facility]. Plan is to start her on dupixent .   OV 05/14/2024 - Lindsay Harris is here to follow up regarding her COPD/Asthma exacerbation and hospitalization. She is doing better than last week but still with significant chest tightness and wheezing. I recommended her using her duonebs Q6H around  the clock for the next 7 days. She is to complete her steroid taper and will add azithromycin  for the next 7 days. She is planned to start Dupixent  as well.    OV 06/11/2024 - Lindsay Harris is here to follow up regarding her COPD Stage II gpE and asthma overlap. She is doing better than last month when she was in an acute exacerbation. I have started her on dupixent  last encounter. She is on triple inhaler therapy and compliant. Undortunately she continues to smoke and is having difficulty quitting. She does complain of back pain that is hindering her ability to participate in rehab. I discussed with her importance of quitting smoking and she will try again. She is also asking for a referral to pain clinic which I will place.   ROS All systems were reviewed and are negative except for the above.  Objective:   Vitals:   06/11/24 1513  BP: 118/72  Pulse: 86  Temp: (!) 97.1 F (36.2 C)  SpO2: 97%  Weight: 243 lb 9.6 oz (110.5 kg)  Height: 5' 3.25 (1.607 m)   97% on RA BMI Readings from Last 3 Encounters:  06/11/24 42.81 kg/m  05/14/24 42.67 kg/m  05/07/24 42.39 kg/m   Wt Readings from Last 3 Encounters:  06/11/24 243 lb 9.6 oz (110.5 kg)  05/14/24 242 lb 12.8 oz (110.1 kg)  05/07/24 241 lb 2.9 oz (109.4 kg)    Physical Exam GEN:  Moderate respiratory distress with accessory muscle use.  HEENT: Supple Neck, Reactive Pupils, EOMI  CVS: Normal S1, Normal S2, RRR, No murmurs or ES appreciated  Lungs: diffuse wheezing. No rales/ronchi.   Abdomen: Soft, non tender, non distended, + BS  Extremities: Warm and well perfused, No edema  Skin: No suspicious lesions appreciated  Psych: Good affect.   Ancillary Information   CBC    Component Value Date/Time   WBC 17.6 (H) 05/08/2024 0417   RBC 4.22 05/08/2024 0417   HGB 12.7 05/08/2024 0417   HCT 38.3 05/08/2024 0417   PLT 300 05/08/2024 0417   MCV 90.8 05/08/2024 0417   MCH 30.1 05/08/2024 0417   MCHC 33.2 05/08/2024 0417   RDW 15.5  05/08/2024 0417   LYMPHSABS 4.2 (H) 05/07/2024 1340   MONOABS 0.6 05/07/2024 1340   EOSABS 0.1 05/07/2024 1340   BASOSABS 0.1 05/07/2024 1340        No data to display         Labs and imaging were reviewed.   Assessment & Plan:  Lindsay Harris is a 48 y.o female patient with a past medical history of severe persistent asthma on triple therapy presenting to the pulmonary clinic today for further management of her asthma.   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   []  c/w budesonide -formoterol  [Symbicort ] 160-4.5 2puffs BID. []  Spiriva  daily.  []  Albuterol /Duonebs as needed. []  Dupixent  started 05/2024   []  Refer back to pulmonary rehab when able to(Had to pause for a period of time)   #Mild OSA (AHI 6.8 2024) Reports lound snoring and apneic episodes. Overweight and increased neck circumference with extreme fatigue during the day. Significant Daytime sleepiness and multiple naps during the day.    []  c/w Auto CPAP 5-15 with 2L O2  []  Resmed Report reviewed and compliance is adequate >= 40%. AHI improved to less than 5. Patient feels significantly better with imprved daily fatigue.   #Tobacco use disorder  Discussed the importance of smoking cessation and dramatic impact on copd progression. She is ready to stop and has decided on Monday next week. Provided NRT.   #Debilitating back pain with sciatica  []  Refer to pain clinic.   RTC 3 months   I spent minutes caring for this patient today, including preparing to see the patient, obtaining a medical history , reviewing a separately obtained history, performing a medically appropriate examination and/or evaluation, documenting clinical information in the electronic health record, and independently interpreting results (not separately reported/billed) and communicating results to the  patient/family/caregiver  Darrin Barn, MD [facility] 06/11/2024 3:17 PM

## 2024-06-15 DIAGNOSIS — R002 Palpitations: Secondary | ICD-10-CM | POA: Insufficient documentation

## 2024-06-15 NOTE — Progress Notes (Unsigned)
 Cardiology Office Note  Date:  06/16/2024   ID:  Lindsay Harris, Lindsay Harris 1976-04-10, MRN [id number]  PCP:  Buren Rock HERO, MD   Chief Complaint  Patient presents with   New Patient (Initial Visit)    Ref by Dr. Buren for palpitations.  Patient c/o shortness of breath with racing irregular heart beats, fluttering in chest, chest pain and pounding in chest. .     HPI:  [name] is a 48 year old woman with past medical history of COPD/chronic bronchitis Severe persistent asthma on triple therapy Previously treated with steroids  Prior exacerbation requiring intubation Smoker 1 pack/day 30 years Who presents by referral from Dr. Rock Buren for consultation of her palpitations/paroxysmal tachycardia and shortness of breath  She reports having paroxysmal tachycardia dating back several years Worse in the past year At times heart will be well-controlled 70-80, other times up to 120 and higher for no reason Currently on propranolol  ER 120 daily for her migraines  On 3 L oxygen continuous Sometimes goes higher on her oxygen on exertion  Has fitbit to track heart rate  [facility] parent with heart valve issues  EKG personally reviewed by myself on todays visit EKG Interpretation Date/Time:  Monday June 16 2024 15:01:38 EDT Ventricular Rate:  83 PR Interval:  138 QRS Duration:  84 QT Interval:  356 QTC Calculation: 418 R Axis:   63  Text Interpretation: Normal sinus rhythm Normal ECG When compared with ECG of 07-May-2024 14:26, rate has slowed Confirmed by Perla Lye [id number]) on 06/16/2024 3:06:59 PM    PMH:   has a past medical history of Asthma, COPD (chronic obstructive pulmonary disease) (HCC), Dyspnea, GERD (gastroesophageal reflux disease), and Headache.  PSH:    Past Surgical History:  Procedure Laterality Date   COLONOSCOPY WITH PROPOFOL  N/A 08/09/2023   Procedure: COLONOSCOPY WITH BIOPSY;  Surgeon: Unk Corinn Skiff, MD;  Location: [facility];  Service: Endoscopy;  Laterality: N/A;   POLYPECTOMY N/A 08/09/2023   Procedure: POLYPECTOMY;  Surgeon: Unk Corinn Skiff, MD;  Location: [facility];  Service: Endoscopy;  Laterality: N/A;    Current Outpatient Medications  Medication Sig Dispense Refill   albuterol  (PROVENTIL ) (2.5 MG/3ML) 0.083% nebulizer solution Take 2.5 mg by nebulization every 4 (four) hours as needed.     albuterol  (VENTOLIN  HFA) 108 (90 Base) MCG/ACT inhaler Inhale 2 puffs into the lungs every 6 (six) hours as needed. 3 each 3   budesonide -formoterol  (SYMBICORT ) 160-4.5 MCG/ACT inhaler Inhale 2 puffs into the lungs in the morning and at bedtime. 1 each 12   cyclobenzaprine (FLEXERIL) 5 MG tablet Take 5 mg by mouth 3 (three) times daily as needed for muscle spasms.     DULoxetine  (CYMBALTA ) 60 MG capsule Take 60 mg by mouth 2 (two) times daily.     Dupilumab  (DUPIXENT ) 300 MG/2ML SOAJ Inject 300 mg into the skin every 14 (fourteen) days. 12 mL 1   Elagolix Sodium  (ORILISSA ) 200 MG TABS Take 1 tablet (200 mg total) by mouth 2 (two) times daily. 60 tablet 6   EPINEPHrine 0.3 mg/0.3 mL IJ SOAJ injection Inject 0.3 mg into the muscle as needed for anaphylaxis.     eszopiclone (LUNESTA) 1 MG TABS tablet Take 1 mg by mouth at bedtime as needed.     furosemide (LASIX) 20 MG tablet Take 20 mg by mouth daily as needed.     gabapentin  (NEURONTIN ) 100 MG capsule Take 400 mg by mouth 2 (two) times daily. Take  400mg  in the morning and 400mg  in the afternoon.     gabapentin  (NEURONTIN ) 600 MG tablet Take 600 mg by mouth at bedtime.     lactulose  (CHRONULAC ) 10 GM/15ML solution Take 30 mLs (20 g total) by mouth daily. 900 mL 3   linaclotide  (LINZESS ) 290 MCG CAPS capsule TAKE 1 CAPSULE BY MOUTH DAILY BEFORE BREAKFAST. 90 capsule 0   montelukast  (SINGULAIR ) 10 MG tablet Take 10 mg by mouth at bedtime.     nicotine  polacrilex (NICOTINE  MINI) 2 MG lozenge Take 1 lozenge (2 mg total) by mouth as needed for smoking  cessation. 100 tablet 4   ondansetron  (ZOFRAN ) 4 mg TABS tablet Take by mouth every 8 (eight) hours as needed.     pantoprazole  (PROTONIX ) 40 MG tablet Take 1 tablet (40 mg total) by mouth 2 (two) times daily before a meal. 60 tablet 0   propranolol  (INDERAL ) 20 MG tablet Take 1 tablet (20 mg total) by mouth 3 (three) times daily as needed. 90 tablet 3   propranolol  ER (INDERAL  LA) 120 MG 24 hr capsule Take 120 mg by mouth daily.     rosuvastatin  (CRESTOR ) 20 MG tablet Take 20 mg by mouth daily.     Simethicone 180 MG CAPS Take 1 capsule by mouth 3 (three) times daily as needed.     Spacer/Aero-Holding Chambers (AEROCHAMBER MV) inhaler Use as instructed 1 each 0   SPIRIVA  HANDIHALER 18 MCG inhalation capsule Place 1 capsule (18 mcg total) into inhaler and inhale daily. 90 capsule 3   SUMAtriptan (IMITREX) 25 MG tablet Take 25 mg by mouth every 2 (two) hours as needed.     nicotine  (NICODERM CQ  - DOSED IN MG/24 HOURS) 14 mg/24hr patch Place 1 patch (14 mg total) onto the skin daily. (Patient not taking: Reported on 06/16/2024) 60 patch 0   nicotine  (NICODERM CQ  - DOSED IN MG/24 HOURS) 21 mg/24hr patch Place 1 patch (21 mg total) onto the skin daily. (Patient not taking: Reported on 06/16/2024) 28 patch 0   No current facility-administered medications for this visit.     Allergies:   Amoxicillin   Social History:  The patient  reports that she has been smoking cigarettes. She started smoking about 38 years ago. She has a 38.5 pack-year smoking history. She does not have any smokeless tobacco history on file. She reports that she does not drink alcohol and does not use drugs.   Family History:   family history includes Breast cancer in her maternal grandmother; Colon cancer in her maternal uncle; Colon cancer (age of onset: 68) in her mother; Ovarian cancer (age of onset: 4) in her sister.    Review of Systems: Review of Systems  Constitutional: Negative.   HENT: Negative.    Respiratory:  Negative.    Cardiovascular: Negative.   Gastrointestinal: Negative.   Musculoskeletal: Negative.   Neurological: Negative.   Psychiatric/Behavioral: Negative.    All other systems reviewed and are negative.  PHYSICAL EXAM: VS:  BP 126/80 (BP Location: Right Arm, Patient Position: Sitting, Cuff Size: Normal)   Pulse 77   Ht 5' 3 (1.6 m)   Wt 252 lb 4 oz (114.4 kg)   SpO2 98% Comment: 3 Liters of oxygen  BMI 44.68 kg/m  , BMI Body mass index is 44.68 kg/m. GEN: Well nourished, well developed, in no acute distress HEENT: normal Neck: no JVD, carotid bruits, or masses Cardiac: RRR; no murmurs, rubs, or gallops,no edema  Respiratory:  clear to auscultation bilaterally,  normal work of breathing GI: soft, nontender, nondistended, + BS MS: no deformity or atrophy Skin: warm and dry, no rash Neuro:  Strength and sensation are intact Psych: euthymic mood, full affect  Recent Labs: 09/04/2023: TSH 2.020 05/07/2024: ALT 36; B Natriuretic Peptide 55.6 05/08/2024: BUN 12; Creatinine, Ser 0.84; Hemoglobin 12.7; Platelets 300; Potassium 3.8; Sodium 136    Lipid Panel Lab Results  Component Value Date   CHOL 317 (H) 09/04/2023   HDL 50 09/04/2023   LDLCALC 229 (H) 09/04/2023   TRIG 195 (H) 09/04/2023      Wt Readings from Last 3 Encounters:  06/16/24 252 lb 4 oz (114.4 kg)  06/11/24 243 lb 9.6 oz (110.5 kg)  05/14/24 242 lb 12.8 oz (110.1 kg)       ASSESSMENT AND PLAN:  Problem List Items Addressed This Visit     Palpitations - Primary   Relevant Orders   EKG 12-Lead (Completed)   LONG TERM MONITOR (3-14 DAYS)   Sleep apnea   Chronic respiratory failure with hypoxia (HCC)   Relevant Orders   EKG 12-Lead (Completed)   Tobacco abuse   Other Visit Diagnoses       Tachycardia       Relevant Orders   LONG TERM MONITOR (3-14 DAYS)   ECHOCARDIOGRAM COMPLETE      Paroxysmal tachycardia Possible inappropriate sinus tachycardia in the setting of COPD on oxygen -Zio  monitor ordered for further evaluation - Currently on propranolol  ER 120 daily - Recommend she take propranolol  20 mg in addition to her ER dose for breakthrough tachycardia rate over 110 - Avoid excessive use of albuterol  inhaler - Heart rate could increase in the setting of respiratory distress and steroids  Smoker encouraged her to continue to work on weaning her cigarettes and smoking cessation.  Shortness of breath COPD on oxygen Echocardiogram ordered to rule out structural heart disease  Patient seen in consultation for Rock Pounds and will be referred back to her office for ongoing care of the issues detailed above   Signed, Velinda Lunger, M.D., Ph.D. [facility] [place], [place] [phone]

## 2024-06-16 ENCOUNTER — Encounter: Payer: Self-pay | Admitting: Cardiovascular Disease

## 2024-06-16 ENCOUNTER — Ambulatory Visit: Attending: Cardiovascular Disease | Admitting: Cardiovascular Disease

## 2024-06-16 ENCOUNTER — Ambulatory Visit: Attending: Cardiovascular Disease

## 2024-06-16 VITALS — BP 126/80 | HR 77 | Ht 63.0 in | Wt 252.2 lb

## 2024-06-16 DIAGNOSIS — R000 Tachycardia, unspecified: Secondary | ICD-10-CM | POA: Insufficient documentation

## 2024-06-16 DIAGNOSIS — J9611 Chronic respiratory failure with hypoxia: Secondary | ICD-10-CM | POA: Diagnosis not present

## 2024-06-16 DIAGNOSIS — G4730 Sleep apnea, unspecified: Secondary | ICD-10-CM | POA: Insufficient documentation

## 2024-06-16 DIAGNOSIS — R002 Palpitations: Secondary | ICD-10-CM | POA: Diagnosis not present

## 2024-06-16 DIAGNOSIS — Z720 Tobacco use: Secondary | ICD-10-CM | POA: Insufficient documentation

## 2024-06-16 MED ORDER — PROPRANOLOL HCL 20 MG PO TABS: 20.0000 mg | ORAL_TABLET | Freq: Three times a day (TID) | ORAL | 3 refills | Status: DC | PRN

## 2024-06-16 NOTE — Patient Instructions (Addendum)
 Medication Instructions:   Propranolol  20 mg up to three times a day as needed for rate >110  If you need a refill on your cardiac medications before your next appointment, please call your pharmacy.   Lab work: No new labs needed  Testing/Procedures: Your physician has requested that you have an echocardiogram. Echocardiography is a painless test that uses sound waves to create images of your heart. It provides your doctor with information about the size and shape of your heart and how well your heart's chambers and valves are working.   You may receive an ultrasound enhancing agent through an IV if needed to better visualize your heart during the echo. This procedure takes approximately one hour.  There are no restrictions for this procedure.  This will take place at [address] [facility] [address], [place] [address]  Please note: We ask at that you not bring children with you during ultrasound (echo/ vascular) testing. Due to room size and safety concerns, children are not allowed in the ultrasound rooms during exams. Our front office staff cannot provide observation of children in our lobby area while testing is being conducted. An adult accompanying a patient to their appointment will only be allowed in the ultrasound room at the discretion of the ultrasound technician under special circumstances. We apologize for any inconvenience.   ZIO XT- Long Term Monitor Instructions  Your physician has requested you wear a ZIO patch monitor for 14 days.  This is a single patch monitor. [employer] supplies one patch monitor per enrollment. Additional stickers are not available. Please do not apply patch if you will be having a Nuclear Stress Test,  Echocardiogram, Cardiac CT, MRI, or Chest Xray during the period you would be wearing the  monitor. The patch cannot be worn during these tests. You cannot remove and re-apply the  ZIO XT patch monitor.  Your ZIO patch monitor will be  mailed 3 day USPS to your address on file. It may take 3-5 days  to receive your monitor after you have been enrolled.  Once you have received your monitor, please review the enclosed instructions. Your monitor  has already been registered assigning a specific monitor serial # to you.  Billing and Patient Assistance Program Information  We have supplied [employer] with any of your insurance information on file for billing purposes. [employer] offers a sliding scale Patient Assistance Program for patients that do not have  insurance, or whose insurance does not completely cover the cost of the ZIO monitor.  You must apply for the Patient Assistance Program to qualify for this discounted rate.  To apply, please call [employer] at [phone], select option 4, select option 2, ask to apply for  Patient Assistance Program. Meredeth will ask your household income, and how many people  are in your household. They will quote your out-of-pocket cost based on that information.  [employer] will also be able to set up a 24-month, interest-free payment plan if needed.  Applying the monitor   Shave hair from upper left chest.  Hold abrader disc by orange tab. Rub abrader in 40 strokes over the upper left chest as  indicated in your monitor instructions.  Clean area with 4 enclosed alcohol pads. Let dry.  Apply patch as indicated in monitor instructions. Patch will be placed under collarbone on left  side of chest with arrow pointing upward.  Rub patch adhesive wings for 2 minutes. Remove white label marked 1. Remove the white  label marked  2. Rub patch adhesive wings for 2 additional minutes.  While looking in a mirror, press and release button in center of patch. A small green light will  flash 3-4 times. This will be your only indicator that the monitor has been turned on.  Do not shower for the first 24 hours. You may shower after the first 24 hours.  Press the button if you feel a symptom. You will hear  a small click. Record Date, Time and  Symptom in the Patient Logbook.  When you are ready to remove the patch, follow instructions on the last 2 pages of Patient  Logbook. Stick patch monitor onto the last page of Patient Logbook.  Place Patient Logbook in the blue and white box. Use locking tab on box and tape box closed  securely. The blue and white box has prepaid postage on it. Please place it in the mailbox as  soon as possible. Your physician should have your test results approximately 7 days after the  monitor has been mailed back to [facility].  Call [facility] Customer Care at [phone] if you have questions regarding  your ZIO XT patch monitor. Call them immediately if you see an orange light blinking on your  monitor.  If your monitor falls off in less than 4 days, contact our Monitor department at [phone].  If your monitor becomes loose or falls off after 4 days call [employer] at [phone] for  suggestions on securing your monitor  Follow-Up: At [facility], you and your health needs are our priority.  As part of our continuing mission to provide you with exceptional heart care, we have created designated Provider Care Teams.  These Care Teams include your primary Cardiologist (physician) and Advanced Practice Providers (APPs -  Physician Assistants and Nurse Practitioners) who all work together to provide you with the care you need, when you need it.  You will need a follow up appointment as needed  Providers on your designated Care Team:   Lonni Meager, NP Bernardino Bring, PA-C Cadence [place], [place]  COVID-19 Vaccine Information can be found at: [email or web address] For questions related to vaccine distribution or appointments, please email [email or web address] or call [phone].

## 2024-06-17 ENCOUNTER — Other Ambulatory Visit: Payer: Self-pay

## 2024-06-17 ENCOUNTER — Other Ambulatory Visit (HOSPITAL_COMMUNITY): Payer: Self-pay

## 2024-06-19 ENCOUNTER — Other Ambulatory Visit (HOSPITAL_COMMUNITY): Payer: Self-pay

## 2024-06-23 ENCOUNTER — Encounter: Payer: Self-pay | Admitting: Family Medicine

## 2024-06-23 ENCOUNTER — Encounter (INDEPENDENT_AMBULATORY_CARE_PROVIDER_SITE_OTHER): Payer: Self-pay

## 2024-06-23 ENCOUNTER — Other Ambulatory Visit: Payer: Self-pay | Admitting: Pharmacy Technician

## 2024-06-23 ENCOUNTER — Other Ambulatory Visit: Payer: Self-pay

## 2024-06-23 ENCOUNTER — Ambulatory Visit: Admitting: Family Medicine

## 2024-06-23 VITALS — BP 130/78 | HR 120 | Ht 63.0 in | Wt 236.0 lb

## 2024-06-23 DIAGNOSIS — R002 Palpitations: Secondary | ICD-10-CM

## 2024-06-23 DIAGNOSIS — F17200 Nicotine dependence, unspecified, uncomplicated: Secondary | ICD-10-CM | POA: Diagnosis not present

## 2024-06-23 DIAGNOSIS — Z7689 Persons encountering health services in other specified circumstances: Secondary | ICD-10-CM | POA: Insufficient documentation

## 2024-06-23 DIAGNOSIS — G4733 Obstructive sleep apnea (adult) (pediatric): Secondary | ICD-10-CM | POA: Insufficient documentation

## 2024-06-23 DIAGNOSIS — J449 Chronic obstructive pulmonary disease, unspecified: Secondary | ICD-10-CM

## 2024-06-23 DIAGNOSIS — E0590 Thyrotoxicosis, unspecified without thyrotoxic crisis or storm: Secondary | ICD-10-CM | POA: Diagnosis not present

## 2024-06-23 DIAGNOSIS — K219 Gastro-esophageal reflux disease without esophagitis: Secondary | ICD-10-CM

## 2024-06-23 DIAGNOSIS — R7303 Prediabetes: Secondary | ICD-10-CM | POA: Insufficient documentation

## 2024-06-23 DIAGNOSIS — F411 Generalized anxiety disorder: Secondary | ICD-10-CM

## 2024-06-23 NOTE — Assessment & Plan Note (Addendum)
 A1c 5.7%. Continue heart healthy diet. Exercise limited by pulmonary condition

## 2024-06-23 NOTE — Assessment & Plan Note (Signed)
 Continuous O2, followed by Pulm, on Symbicort  BID, Spiriva  daily, Albuterol  PRN, Dupixent , pulm rehab, advised to quit smoking

## 2024-06-23 NOTE — Assessment & Plan Note (Signed)
 Being worked up with cardiology.

## 2024-06-23 NOTE — Assessment & Plan Note (Signed)
 Today we reviewed your medical history, health maintenance items, and current concerns. No new concerns since CPE in March. Has had labs drawn in May and has seen several specialists since. Will follow up in 1 month with fasting labs for chronic conditions. Medical records requested for review.

## 2024-06-23 NOTE — Progress Notes (Signed)
 Specialty Pharmacy Refill Coordination Note  LAURELLE SKIVER is a 48 y.o. female contacted today regarding refills of specialty medication(s) Dupilumab  (Dupixent )   Patient requested (Patient-Rptd) Delivery   Delivery date: 07/09/2024  Verified address: (Patient-Rptd) [address] [place] [address]   Medication will be filled on 07/08/2024.

## 2024-06-23 NOTE — Assessment & Plan Note (Signed)
 3-5 minute discussion regarding the harms of tobacco use, the benefits of cessation, and methods of cessation. Discussed that there are medication options to help with cessation. Provided printed education on steps to quit smoking. Patient is not using a medication to help.

## 2024-06-23 NOTE — Progress Notes (Signed)
 New Patient Office Visit  Subjective    Patient ID: Lindsay Harris, female    DOB: 28-May-1976  Age: 48 y.o. MRN: [id number]  CC:  Chief Complaint  Patient presents with   Establish Care    HPI Lindsay Harris presents to establish care. Oriented to practice routines and expectations. Has been seeing PCP, most recently 1 month ago. Followed by Rheumatology, Cardiology, Pulmonology, Urology, Gastroenterology, Podiatry, OBGYN, and Ortho Surgery. PMH includes palpitations and tachycardia, COPD, asthma, smoker, urinary incontinence, low back pain from herniated discs, DDD, bursitis of her right hip with referral to pain clinic authorized, seizure, IBS-C, anxiety and depression, and arthritis. Sees OBGYN for ovarian cysts, endometriosis, and PCOS.   Anxiety: uncontrolled on Duloxetine  60mg  daily, has tried Zoloft, would like psychiatry referral, panic attacks   COPD: continuous O2, Symbicort  BID, Spiriva  daily, Albuterol  PRN, Dupixent , pulm rehab, advised to quit smoking  OSA: on CPAP  Nicotine  dependence: declines medication  Urinary incontinence: mixed, followed by Urology, not on medication  Chronic back pain: has been referred to pain clinic Dr Patterson  IBS-C: on Linzess  and Lactulose , followed by GI  Breast CA screening: Mammogram status: Completed 2024. Repeat every year Cervical CA screening: approximate date 2024 and was normal Colon CA screening: colonoscopy 1 years ago with abnormalities. 2x precancerous polyp, 3 year follow up. 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    []  c/w budesonide -formoterol  [Symbicort ] 160-4.5 2puffs BID. []  Spiriva  daily.  []  Albuterol /Duonebs as needed. []  Dupixent  started 05/2024   []  Refer back to pulmonary rehab when able to(Had to pause for a period of time)    #Mild OSA (AHI 6.8 2024) Reports lound snoring and apneic episodes. Overweight and increased neck circumference with extreme fatigue during the day. Significant Daytime sleepiness and multiple naps during the day.     []  c/w Auto CPAP 5-15 with 2L O2  []  Resmed Report reviewed and compliance is adequate >= 40%. AHI improved to less than 5. Patient feels significantly better with imprved daily fatigue.    #Tobacco use disorder  Discussed the importance of smoking cessation and dramatic impact on copd progression. She is ready to stop and has decided on Monday next week. Provided NRT.    #Debilitating back pain with sciatica  []  Refer to pain clinic.   Health Maintenance  Topic Date Due   COVID-19 Vaccine (1) Never done   Hepatitis C Screening  Never done   DTaP/Tdap/Td (1 - Tdap) Never done   Pneumococcal Vaccine 60-57 Years old (1 of 2 - PCV) Never done   Hepatitis B Vaccines (1 of 3 - 19+ 3-dose series) Never done   MAMMOGRAM  03/08/2024   INFLUENZA VACCINE  07/18/2024   Colonoscopy  08/08/2028   Cervical Cancer Screening (HPV/Pap Cotest)  09/03/2028   HIV Screening  Completed   HPV VACCINES  Aged Out   Meningococcal B Vaccine  Aged Out     Outpatient Encounter Medications as of 06/23/2024  Medication Sig   albuterol  (PROVENTIL ) (2.5 MG/3ML) 0.083% nebulizer solution Take 2.5 mg by nebulization every 4 (four) hours as needed.   albuterol  (VENTOLIN  HFA)  108 (90 Base) MCG/ACT inhaler Inhale 2 puffs into the lungs every 6 (six) hours as needed.   budesonide -formoterol  (SYMBICORT ) 160-4.5 MCG/ACT inhaler Inhale 2 puffs into the lungs in the morning and at bedtime.   cyclobenzaprine (FLEXERIL) 5 MG tablet Take 5 mg by mouth 3 (three) times daily as needed for muscle spasms.   DULoxetine  (CYMBALTA ) 60 MG capsule Take 60 mg by mouth 2 (two) times daily.   Dupilumab  (DUPIXENT ) 300 MG/2ML SOAJ Inject 300 mg into the skin every 14 (fourteen) days.   Elagolix Sodium  (ORILISSA ) 200 MG TABS Take 1 tablet (200 mg total) by mouth 2 (two) times daily.   EPINEPHrine 0.3 mg/0.3 mL IJ SOAJ injection Inject 0.3 mg into the muscle as needed for anaphylaxis.   eszopiclone (LUNESTA) 1 MG TABS tablet Take 1 mg by mouth at bedtime as needed.   furosemide (LASIX) 20 MG tablet Take 20 mg by mouth daily as needed.   gabapentin  (NEURONTIN ) 100 MG capsule Take 400 mg by mouth 2 (two) times daily. Take 400mg  in the morning and 400mg  in the afternoon.   gabapentin  (NEURONTIN ) 600 MG tablet Take 600 mg by mouth at bedtime.   lactulose  (CHRONULAC ) 10 GM/15ML solution Take 30 mLs (20 g total) by mouth daily.   linaclotide  (LINZESS ) 290 MCG CAPS capsule TAKE 1 CAPSULE BY MOUTH DAILY BEFORE BREAKFAST.   montelukast  (SINGULAIR ) 10 MG tablet Take 10 mg by mouth at bedtime.   nicotine  (NICODERM CQ  - DOSED IN MG/24 HOURS) 14 mg/24hr patch Place 1 patch (14 mg total) onto the skin daily.   nicotine  (NICODERM CQ  - DOSED IN MG/24 HOURS) 21 mg/24hr patch Place 1 patch (21 mg total) onto the skin daily.   ondansetron  (ZOFRAN ) 4 mg TABS tablet Take by mouth every 8 (eight) hours as needed.   pantoprazole  (PROTONIX ) 40 MG tablet Take 1 tablet (40 mg total) by mouth 2 (two) times daily before a meal.   propranolol  (INDERAL ) 20 MG tablet Take 1 tablet (20 mg total) by mouth 3 (three) times daily as needed.   propranolol  ER (INDERAL  LA) 120 MG 24 hr capsule Take 120 mg by mouth daily.    rosuvastatin  (CRESTOR ) 20 MG tablet Take 20 mg by mouth daily.   Simethicone 180 MG CAPS Take 1 capsule by mouth 3 (three) times daily as needed.   Spacer/Aero-Holding Chambers (AEROCHAMBER MV) inhaler Use as instructed   SPIRIVA  HANDIHALER 18 MCG inhalation capsule Place 1 capsule (18 mcg total) into inhaler and inhale daily.   SUMAtriptan (IMITREX) 25 MG tablet Take 25 mg by mouth every 2 (two) hours as needed.   nicotine  polacrilex (NICOTINE  MINI) 2 MG lozenge Take 1 lozenge (2 mg total) by mouth as needed for smoking cessation. (Patient not taking: Reported on 06/23/2024)   No facility-administered encounter medications on file as of 06/23/2024.    Past Medical History:  Diagnosis Date   Asthma    COPD (chronic obstructive pulmonary disease) (HCC)    Dyspnea    GERD (gastroesophageal reflux disease)    Headache    migraines    Past Surgical History:  Procedure Laterality Date  COLONOSCOPY WITH PROPOFOL  N/A 08/09/2023   Procedure: COLONOSCOPY WITH BIOPSY;  Surgeon: Unk Corinn Skiff, MD;  Location: [facility];  Service: Endoscopy;  Laterality: N/A;   POLYPECTOMY N/A 08/09/2023   Procedure: POLYPECTOMY;  Surgeon: Unk Corinn Skiff, MD;  Location: [facility];  Service: Endoscopy;  Laterality: N/A;    Family History  Problem Relation Age of Onset   Colon cancer Mother 84   Ovarian cancer Sister 11       Stage 3   Colon cancer Maternal Uncle        early 30s   Breast cancer Maternal Grandmother        twice: early 19s and mid 74s    Social History   Socioeconomic History   Marital status: Married    Spouse name: Not on file   Number of children: Not on file   Years of education: Not on file   Highest education level: Not on file  Occupational History   Not on file  Tobacco Use   Smoking status: Every Day    Current packs/day: 1.00    Average packs/day: 1 pack/day for 38.5 years (38.5 ttl pk-yrs)    Types: Cigarettes    Start date: 34    Smokeless tobacco: Not on file   Tobacco comments:    Has smoked since age 37        Smokes 1 PPD- khj 06/11/2024  Vaping Use   Vaping status: Never Used  Substance and Sexual Activity   Alcohol use: No   Drug use: No   Sexual activity: Not on file    Comment: BTL  Other Topics Concern   Not on file  Social History Narrative   Not on file   Social Drivers of Health   Financial Resource Strain: Patient Declined (03/26/2024)   Received from [facility]   Overall Financial Resource Strain (CARDIA)    Difficulty of Paying Living Expenses: Patient declined  Food Insecurity: Patient Declined (03/26/2024)   Received from [facility]   Hunger Vital Sign    Within the past 12 months, you worried that your food would run out before you got the money to buy more.: Patient declined    Within the past 12 months, the food you bought just didn't last and you didn't have money to get more.: Patient declined  Transportation Needs: Patient Declined (03/26/2024)   Received from [facility]    In the past 12 months, has lack of transportation kept you from medical appointments or from getting medications?: Patient declined    Lack of Transportation (Non-Medical): Patient declined  Physical Activity: Not on file  Stress: Not on file  Social Connections: Not on file  Intimate Partner Violence: Not on file    Review of Systems  Constitutional: Negative.   HENT: Negative.    Eyes: Negative.   Respiratory:  Positive for shortness of breath and wheezing.   Cardiovascular:  Positive for palpitations.  Gastrointestinal:  Positive for constipation.  Genitourinary: Negative.   Musculoskeletal:  Positive for back pain.  Skin: Negative.   Neurological: Negative.   Endo/Heme/Allergies: Negative.   Psychiatric/Behavioral:  The patient is nervous/anxious.   All other systems reviewed and are negative.       Objective     BP 130/78   Pulse (!) 120   Ht 5' 3 (1.6 m)   Wt 236 lb (107 kg)   SpO2 98% Comment:  On oxygen  BMI 41.81 kg/m   Physical Exam Vitals and nursing note reviewed.  Constitutional:      Appearance: Normal appearance. She is obese.     Comments: Home O2, ambulating with a walker  HENT:     Head: Normocephalic and atraumatic.  Cardiovascular:     Rate and Rhythm: Normal rate and regular rhythm.     Pulses: Normal pulses.     Heart sounds: Normal heart sounds.  Pulmonary:     Effort: Pulmonary effort is normal.     Breath sounds: Wheezing present.  Skin:    General: Skin is warm and dry.  Neurological:     General: No focal deficit present.     Mental Status: She is alert and oriented to person, place, and time. Mental status is at baseline.  Psychiatric:        Mood and Affect: Mood normal.        Behavior: Behavior normal.        Thought Content: Thought content normal.        Judgment: Judgment normal.         Assessment & Plan:   Problem List Items Addressed This Visit     Tobacco dependence   3-5 minute discussion regarding the harms of tobacco use, the benefits of cessation, and methods of cessation. Discussed that there are medication options to help with cessation. Provided printed education on steps to quit smoking. Patient is not using a medication to help.       Relevant Orders   Ambulatory referral to Psychiatry   Generalized anxiety disorder   Continue Duloxetine  60mg  daily, failed zoloft. Referral to psychitary. Denies SI/HI.       Relevant Orders   Ambulatory referral to Psychiatry   Hyperthyroidism   TSH ordered      Relevant Orders   CBC with Differential/Platelet   Comprehensive metabolic panel with GFR   Lipid panel   VITAMIN D 25 Hydroxy (Vit-D Deficiency, Fractures)   TSH   COPD (chronic obstructive pulmonary disease) (HCC)   Continuous O2, followed by Pulm, on Symbicort  BID, Spiriva  daily, Albuterol  PRN, Dupixent , pulm rehab,  advised to quit smoking      Relevant Orders   CBC with Differential/Platelet   Comprehensive metabolic panel with GFR   Lipid panel   VITAMIN D 25 Hydroxy (Vit-D Deficiency, Fractures)   TSH   GERD (gastroesophageal reflux disease)   Continue Protonix , no red flags, followed by GI.      Relevant Orders   CBC with Differential/Platelet   Comprehensive metabolic panel with GFR   Lipid panel   VITAMIN D 25 Hydroxy (Vit-D Deficiency, Fractures)   TSH   Palpitations   Being worked up with cardiology.       Relevant Orders   CBC with Differential/Platelet   Comprehensive metabolic panel with GFR   Lipid panel   VITAMIN D 25 Hydroxy (Vit-D Deficiency, Fractures)   TSH   OSA (obstructive sleep apnea)   On CPAP      Prediabetes   A1c 5.7%. Continue heart healthy diet. Exercise limited by pulmonary condition      Relevant Orders   Hemoglobin A1c   Encounter to establish care with new provider - Primary   Today we reviewed your medical history, health maintenance items, and current concerns. No new concerns since CPE in March. Has had labs drawn in May and has seen several specialists since. Will follow up in 1 month with fasting labs  for chronic conditions. Medical records requested for review.        Return in about 8 weeks (around 08/18/2024) for chronic follow-up with labs 1 week prior.   Jeoffrey GORMAN Barrio, FNP

## 2024-06-23 NOTE — Patient Instructions (Signed)
 It was great to meet you today and I'm excited to have you join the [facility] practice. I hope you had a positive experience today! If you feel so inclined, please feel free to recommend our practice to friends and family. Kurtis Bushman, FNP-C

## 2024-06-23 NOTE — Assessment & Plan Note (Signed)
 Continue Duloxetine  60mg  daily, failed zoloft. Referral to psychitary. Denies SI/HI.

## 2024-06-23 NOTE — Assessment & Plan Note (Signed)
 TSH ordered.

## 2024-06-23 NOTE — Assessment & Plan Note (Signed)
 On CPAP. ?

## 2024-06-23 NOTE — Assessment & Plan Note (Signed)
 Continue Protonix , no red flags, followed by GI.

## 2024-06-25 ENCOUNTER — Encounter: Payer: Self-pay | Admitting: Pulmonary Disease

## 2024-06-25 ENCOUNTER — Encounter: Payer: Self-pay | Admitting: Gastroenterology

## 2024-06-26 ENCOUNTER — Telehealth: Payer: Self-pay | Admitting: Pulmonary Disease

## 2024-06-26 ENCOUNTER — Telehealth: Payer: Self-pay

## 2024-06-26 DIAGNOSIS — J4489 Other specified chronic obstructive pulmonary disease: Secondary | ICD-10-CM

## 2024-06-26 MED ORDER — AZITHROMYCIN 500 MG PO TABS: 500.0000 mg | ORAL_TABLET | Freq: Every day | ORAL | 0 refills | Status: AC

## 2024-06-26 NOTE — Telephone Encounter (Signed)
 Left message for patient to call back to schedule New patient Appointment

## 2024-06-26 NOTE — Telephone Encounter (Signed)
 Patient called with Chest congestion and COPD exacerbation. Prescribed Azithromycin  for 3 days.   Darrin Barn, MD [facility] 06/26/2024 7:16 PM

## 2024-06-27 ENCOUNTER — Other Ambulatory Visit: Payer: Self-pay | Admitting: Pulmonary Disease

## 2024-06-27 DIAGNOSIS — J4551 Severe persistent asthma with (acute) exacerbation: Secondary | ICD-10-CM

## 2024-06-27 NOTE — Telephone Encounter (Signed)
Noted, nothing further needed.  

## 2024-07-02 ENCOUNTER — Ambulatory Visit: Attending: Cardiovascular Disease

## 2024-07-02 DIAGNOSIS — R000 Tachycardia, unspecified: Secondary | ICD-10-CM | POA: Insufficient documentation

## 2024-07-02 LAB — ECHOCARDIOGRAM COMPLETE
AV Mean grad: 6 mmHg
AV Peak grad: 11.8 mmHg
Ao pk vel: 1.72 m/s
Area-P 1/2: 4.21 cm2

## 2024-07-05 ENCOUNTER — Other Ambulatory Visit: Payer: Self-pay | Admitting: Pulmonary Disease

## 2024-07-05 ENCOUNTER — Ambulatory Visit: Payer: Self-pay | Admitting: Cardiovascular Disease

## 2024-07-05 DIAGNOSIS — J439 Emphysema, unspecified: Secondary | ICD-10-CM

## 2024-07-08 ENCOUNTER — Other Ambulatory Visit: Payer: Self-pay

## 2024-07-09 NOTE — Addendum Note (Signed)
 Addended by: DAYNE SHERRY RAMAN on: 07/09/2024 10:09 AM   Modules accepted: Level of Service

## 2024-07-20 ENCOUNTER — Other Ambulatory Visit: Payer: Self-pay | Admitting: Medical Genetics

## 2024-07-21 ENCOUNTER — Ambulatory Visit: Admitting: Urology

## 2024-07-21 VITALS — BP 99/64 | HR 90

## 2024-07-21 DIAGNOSIS — R32 Unspecified urinary incontinence: Secondary | ICD-10-CM

## 2024-07-21 DIAGNOSIS — N3946 Mixed incontinence: Secondary | ICD-10-CM

## 2024-07-21 LAB — URINALYSIS, COMPLETE
Bilirubin, UA: NEGATIVE
Ketones, UA: NEGATIVE
Leukocytes,UA: NEGATIVE
Nitrite, UA: NEGATIVE
Protein,UA: NEGATIVE
RBC, UA: NEGATIVE
Specific Gravity, UA: 1.015 (ref 1.005–1.030)
Urobilinogen, Ur: 0.2 mg/dL (ref 0.2–1.0)
pH, UA: 6.5 (ref 5.0–7.5)

## 2024-07-21 LAB — MICROSCOPIC EXAMINATION: Epithelial Cells (non renal): >10 /HPF — AB (ref 0–10)

## 2024-07-21 MED ORDER — GEMTESA 75 MG PO TABS: 75.0000 mg | ORAL_TABLET | Freq: Every day | ORAL | Status: AC

## 2024-07-21 NOTE — Progress Notes (Signed)
 07/21/2024 3:02 PM   Jerelene MARLA Sewer November 25, 1976 [id number]  Referring provider: Buren Rock HERO, MD [address] [place],  [place] [address]  No chief complaint on file.   HPI: I was consulted to assist the patient as urinary incontinence.  She leaks with coughing sneezing bending lifting.  She has urge incontinence.  Both are significant.  She has mild to moderate bedwetting.  She wears 4-5 pads a day that are moderately wet or soaked   She voids every hour and cannot hold it for 2 hours.  She gets up almost every hour at night.  She takes a fluid pill and she has ankle edema   Flow is slower.  She does not feel empty.   She has not had a hysterectomy   She is on home oxygen for COPD     Grade 2 hypermobility bladder neck and a positive cough test.  No significant prolapse.  Blood on tip of speculum and she is on her cycle   Patient has mixed incontinence and bedwetting.  She is on home oxygen.  She has significant frequency and nocturia.  She has medical comorbidities.  Role of urodynamics and cystoscopy discussed.  Recheck urinalysis next time to make sure there is no blood in the urine.  Call if culture positive.  She had a normal CT scan of the kidneys in June 2024   Patient has significant stress incontinence on pelvic examination but home oxygen and medical comorbidities will need to be taken to consideration  Today Frequency stable.  Incontinence stable.  Urine culture negative She is smoked for almost 40 years at age 48 Patient underwent urodynamics.  She voided 322 mL with a maximal flow at 50 mL/s.  She had a prolonged pattern.  She was catheterized for 13 mL.  Maximum bladder capacity was 821 mL.  She decreased bladder sensation.  Her bladder stable.  Her cuff leak point pressure 200 mL was 39 cm of water  with severe leakage.  Cuff leak point pressure at 600 mL was 41 cm of water  with severe leakage.  Valsalva leak point pressure was 23 cmH2O at the same volume with  severe leakage.  During voiding she voided 577 mL.  Maximal flow was 15 mL/s.  Voiding pressure was approximately 46 to 65 cm of water .  EMG activity within normal limits.  Bladder neck descended approximately centimeter.  The details of the urodynamics are signed and dictated  Today no blood on speculum.  She only has mild hypermobility.  Based on body habitus she has a relatively deep vaginal vault that would make Bulkamid challenging  Cystoscopy -patient underwent flexible cystoscopy.  Bladder Koza trigone were normal.  No cystitis.  No carcinoma   PMH: Past Medical History:  Diagnosis Date   Asthma    COPD (chronic obstructive pulmonary disease) (HCC)    Dyspnea    GERD (gastroesophageal reflux disease)    Headache    migraines    Surgical History: Past Surgical History:  Procedure Laterality Date   COLONOSCOPY WITH PROPOFOL  N/A 08/09/2023   Procedure: COLONOSCOPY WITH BIOPSY;  Surgeon: Unk Corinn Skiff, MD;  Location: [facility];  Service: Endoscopy;  Laterality: N/A;   POLYPECTOMY N/A 08/09/2023   Procedure: POLYPECTOMY;  Surgeon: Unk Corinn Skiff, MD;  Location: [facility];  Service: Endoscopy;  Laterality: N/A;    Home Medications:  Allergies as of 07/21/2024       Reactions   Amoxicillin Anaphylaxis, Hives  Medication List        Accurate as of July 21, 2024  3:02 PM. If you have any questions, ask your nurse or doctor.          AeroChamber MV inhaler Use as instructed   albuterol  (2.5 MG/3ML) 0.083% nebulizer solution Commonly known as: PROVENTIL  Take 2.5 mg by nebulization every 4 (four) hours as needed.   albuterol  108 (90 Base) MCG/ACT inhaler Commonly known as: VENTOLIN  HFA Inhale 2 puffs into the lungs every 6 (six) hours as needed.   cyclobenzaprine 5 MG tablet Commonly known as: FLEXERIL Take 5 mg by mouth 3 (three) times daily as needed for muscle spasms.   DULoxetine  60 MG capsule Commonly known as:  CYMBALTA  Take 60 mg by mouth 2 (two) times daily.   Dupixent  300 MG/2ML Soaj Generic drug: Dupilumab  Inject 300 mg into the skin every 14 (fourteen) days.   EPINEPHrine 0.3 mg/0.3 mL Soaj injection Commonly known as: EPI-PEN Inject 0.3 mg into the muscle as needed for anaphylaxis.   eszopiclone 1 MG Tabs tablet Commonly known as: LUNESTA Take 1 mg by mouth at bedtime as needed.   furosemide 20 MG tablet Commonly known as: LASIX Take 20 mg by mouth daily as needed.   gabapentin  100 MG capsule Commonly known as: NEURONTIN  Take 400 mg by mouth 2 (two) times daily. Take 400mg  in the morning and 400mg  in the afternoon.   gabapentin  600 MG tablet Commonly known as: NEURONTIN  Take 600 mg by mouth at bedtime.   lactulose  10 GM/15ML solution Commonly known as: CHRONULAC  Take 30 mLs (20 g total) by mouth daily.   Linzess  290 MCG Caps capsule Generic drug: linaclotide  TAKE 1 CAPSULE BY MOUTH DAILY BEFORE BREAKFAST.   montelukast  10 MG tablet Commonly known as: SINGULAIR  Take 10 mg by mouth at bedtime.   nicotine  21 mg/24hr patch Commonly known as: NICODERM CQ  - dosed in mg/24 hours Place 1 patch (21 mg total) onto the skin daily.   nicotine  14 mg/24hr patch Commonly known as: NICODERM CQ  - dosed in mg/24 hours PLACE 1 PATCH ONTO THE SKIN DAILY.   nicotine  polacrilex 2 MG lozenge Commonly known as: Nicotine  Mini Take 1 lozenge (2 mg total) by mouth as needed for smoking cessation.   ondansetron  4 mg Tabs tablet Commonly known as: ZOFRAN  Take by mouth every 8 (eight) hours as needed.   Orilissa  200 MG Tabs Generic drug: Elagolix Sodium  Take 1 tablet (200 mg total) by mouth 2 (two) times daily.   pantoprazole  40 MG tablet Commonly known as: PROTONIX  Take 1 tablet (40 mg total) by mouth 2 (two) times daily before a meal.   propranolol  ER 120 MG 24 hr capsule Commonly known as: INDERAL  LA Take 120 mg by mouth daily.   propranolol  20 MG tablet Commonly known as:  INDERAL  Take 1 tablet (20 mg total) by mouth 3 (three) times daily as needed.   rosuvastatin  20 MG tablet Commonly known as: CRESTOR  Take 20 mg by mouth daily.   Simethicone 180 MG Caps Take 1 capsule by mouth 3 (three) times daily as needed.   Spiriva  HandiHaler 18 MCG inhalation capsule Generic drug: tiotropium Place 1 capsule (18 mcg total) into inhaler and inhale daily.   SUMAtriptan 25 MG tablet Commonly known as: IMITREX Take 25 mg by mouth every 2 (two) hours as needed.   Symbicort  160-4.5 MCG/ACT inhaler Generic drug: budesonide -formoterol  INHALE 2 PUFFS INTO THE LUNGS IN THE MORNING AND AT BEDTIME.  Allergies:  Allergies  Allergen Reactions   Amoxicillin Anaphylaxis and Hives    Family History: Family History  Problem Relation Age of Onset   Colon cancer Mother 49   Ovarian cancer Sister 59       Stage 3   Colon cancer Maternal Uncle        early 30s   Breast cancer Maternal Grandmother        twice: early 47s and mid 28s    Social History:  reports that she has been smoking cigarettes. She started smoking about 38 years ago. She has a 38.6 pack-year smoking history. She does not have any smokeless tobacco history on file. She reports that she does not drink alcohol and does not use drugs.  ROS:                                        Physical Exam: There were no vitals taken for this visit.  Constitutional:  Alert and oriented, No acute distress. HEENT: [place] AT, moist mucus membranes.  Trachea midline, no masses.  Laboratory Data: Lab Results  Component Value Date   WBC 17.6 (H) 05/08/2024   HGB 12.7 05/08/2024   HCT 38.3 05/08/2024   MCV 90.8 05/08/2024   PLT 300 05/08/2024    Lab Results  Component Value Date   CREATININE 0.84 05/08/2024    No results found for: PSA  No results found for: TESTOSTERONE  Lab Results  Component Value Date   HGBA1C 5.7 (H) 05/07/2024    Urinalysis    Component Value  Date/Time   COLORURINE YELLOW 04/21/2024 0203   APPEARANCEUR Slightly cloudy 04/28/2024 1425   LABSPEC 1.020 04/21/2024 0203   PHURINE 6.0 04/21/2024 0203   GLUCOSEU Negative 04/28/2024 1425   HGBUR NEGATIVE 04/21/2024 0203   BILIRUBINUR Negative 04/28/2024 1425   KETONESUR NEGATIVE 04/21/2024 0203   PROTEINUR Negative 04/28/2024 1425   PROTEINUR NEGATIVE 04/21/2024 0203   UROBILINOGEN 0.2 09/19/2018 1021   NITRITE Negative 04/28/2024 1425   NITRITE NEGATIVE 04/21/2024 0203   LEUKOCYTESUR Negative 04/28/2024 1425   LEUKOCYTESUR NEGATIVE 04/21/2024 0203    Pertinent Imaging:   Assessment & Plan: Patient does clinically have an overactive bladder with urge incontinence frequency and bedwetting.  She has significant stress incontinence by history physical and urodynamics.  She has medical comorbidities and is on home oxygen.  I will try to help her with medical and behavioral therapy.  Physical therapy offered.  It would be challenging to do a bulking agent in the clinic based on body habitus.  She would need medical clearance for anesthesia for Bulkamid or surgery  Reassess in [place] .  She held off on physical therapy.  She hopes to be helped without surgery    1. Urinary incontinence, unspecified type (Primary)  - Urinalysis, Complete -Regularly  Moderate to I cannot believe that what I did in his right through the morning on work at 631 and he got little sleep couple  No follow-ups on file.  Glendia DELENA Elizabeth, MD  [facility] [address] [place], [place] [address] [phone]

## 2024-07-24 ENCOUNTER — Encounter: Payer: Self-pay | Admitting: Family Medicine

## 2024-07-24 ENCOUNTER — Ambulatory Visit: Admitting: Family Medicine

## 2024-07-24 VITALS — BP 115/65 | HR 75 | Temp 98.0°F | Ht 63.0 in | Wt 255.0 lb

## 2024-07-24 DIAGNOSIS — J449 Chronic obstructive pulmonary disease, unspecified: Secondary | ICD-10-CM

## 2024-07-24 DIAGNOSIS — F411 Generalized anxiety disorder: Secondary | ICD-10-CM | POA: Diagnosis not present

## 2024-07-24 DIAGNOSIS — M5442 Lumbago with sciatica, left side: Secondary | ICD-10-CM

## 2024-07-24 DIAGNOSIS — R002 Palpitations: Secondary | ICD-10-CM

## 2024-07-24 DIAGNOSIS — E0590 Thyrotoxicosis, unspecified without thyrotoxic crisis or storm: Secondary | ICD-10-CM

## 2024-07-24 DIAGNOSIS — G8929 Other chronic pain: Secondary | ICD-10-CM

## 2024-07-24 DIAGNOSIS — K219 Gastro-esophageal reflux disease without esophagitis: Secondary | ICD-10-CM

## 2024-07-24 DIAGNOSIS — M5441 Lumbago with sciatica, right side: Secondary | ICD-10-CM

## 2024-07-24 DIAGNOSIS — R7303 Prediabetes: Secondary | ICD-10-CM

## 2024-07-24 DIAGNOSIS — Z1231 Encounter for screening mammogram for malignant neoplasm of breast: Secondary | ICD-10-CM

## 2024-07-24 DIAGNOSIS — G4733 Obstructive sleep apnea (adult) (pediatric): Secondary | ICD-10-CM

## 2024-07-24 DIAGNOSIS — F17200 Nicotine dependence, unspecified, uncomplicated: Secondary | ICD-10-CM | POA: Diagnosis not present

## 2024-07-24 NOTE — Assessment & Plan Note (Signed)
 Upcoming appt with pain management

## 2024-07-24 NOTE — Assessment & Plan Note (Signed)
 On cymbalta  60mg  daily. Scheduled with psychaitry.

## 2024-07-24 NOTE — Assessment & Plan Note (Addendum)
 Continuous O2, followed by Pulm, on Symbicort  BID, Spiriva  daily, Albuterol  PRN, Dupixent , pulm rehab, advised to quit smoking, trying nicotine  replacement

## 2024-07-24 NOTE — Progress Notes (Signed)
 Subjective:  HPI: Lindsay Harris is a 48 y.o. female presenting on 07/24/2024 for Medical Management of Chronic Issues (1 month f/u )   HPI Patient is in today for chronic condition follow up.  Followed by Cardiology, Pulmonology, Urology, Gastroenterology, Podiatry, OBGYN, and Ortho Surgery. PMH includes palpitations and tachycardia, COPD, asthma, smoker, urinary incontinence, low back pain from herniated discs, DDD, bursitis of her right hip with referral to pain clinic authorized, seizure, IBS-C, anxiety and depression, and arthritis. Sees OBGYN for ovarian cysts, endometriosis, and PCOS.  Has established with urology and pain management (sees next week) Mammogram ordered   Anxiety: uncontrolled on Duloxetine  60mg  daily, has tried Zoloft, would like psychiatry referral, panic attacks    COPD: continuous O2, Symbicort  BID, Spiriva  daily, Albuterol  PRN, Dupixent , pulm rehab, advised to quit smoking   OSA: on CPAP   Nicotine  dependence: declines medication   Urinary incontinence: established with urology, started on Gemtesa    Chronic back pain: has been referred to pain clinic Dr Patterson   IBS-C: on Linzess  and Lactulose , followed by GI  Review of Systems  All other systems reviewed and are negative.   Relevant past medical history reviewed and updated as indicated.   Past Medical History:  Diagnosis Date   Asthma    COPD (chronic obstructive pulmonary disease) (HCC)    Dyspnea    GERD (gastroesophageal reflux disease)    Headache    migraines   Thyroid disease [id number]     Past Surgical History:  Procedure Laterality Date   COLONOSCOPY WITH PROPOFOL  N/A 08/09/2023   Procedure: COLONOSCOPY WITH BIOPSY;  Surgeon: Unk Corinn Skiff, MD;  Location: [facility];  Service: Endoscopy;  Laterality: N/A;   POLYPECTOMY N/A 08/09/2023   Procedure: POLYPECTOMY;  Surgeon: Unk Corinn Skiff, MD;  Location: [facility];  Service: Endoscopy;  Laterality: N/A;     Allergies and medications reviewed and updated.   Current Outpatient Medications:    albuterol  (PROVENTIL ) (2.5 MG/3ML) 0.083% nebulizer solution, Take 2.5 mg by nebulization every 4 (four) hours as needed., Disp: , Rfl:    albuterol  (VENTOLIN  HFA) 108 (90 Base) MCG/ACT inhaler, Inhale 2 puffs into the lungs every 6 (six) hours as needed., Disp: 3 each, Rfl: 3   cyclobenzaprine (FLEXERIL) 5 MG tablet, Take 5 mg by mouth 3 (three) times daily as needed for muscle spasms., Disp: , Rfl:    DULoxetine  (CYMBALTA ) 60 MG capsule, Take 60 mg by mouth 2 (two) times daily., Disp: , Rfl:    Dupilumab  (DUPIXENT ) 300 MG/2ML SOAJ, Inject 300 mg into the skin every 14 (fourteen) days., Disp: 12 mL, Rfl: 1   Elagolix Sodium  (ORILISSA ) 200 MG TABS, Take 1 tablet (200 mg total) by mouth 2 (two) times daily., Disp: 60 tablet, Rfl: 6   EPINEPHrine 0.3 mg/0.3 mL IJ SOAJ injection, Inject 0.3 mg into the muscle as needed for anaphylaxis., Disp: , Rfl:    eszopiclone (LUNESTA) 1 MG TABS tablet, Take 1 mg by mouth at bedtime as needed., Disp: , Rfl:    furosemide (LASIX) 20 MG tablet, Take 20 mg by mouth daily as needed., Disp: , Rfl:    gabapentin  (NEURONTIN ) 100 MG capsule, Take 400 mg by mouth 2 (two) times daily. Take 400mg  in the morning and 400mg  in the afternoon., Disp: , Rfl:    gabapentin  (NEURONTIN ) 600 MG tablet, Take 600 mg by mouth at bedtime., Disp: , Rfl:    lactulose  (CHRONULAC ) 10 GM/15ML solution, Take 30 mLs (20  g total) by mouth daily., Disp: 900 mL, Rfl: 3   linaclotide  (LINZESS ) 290 MCG CAPS capsule, TAKE 1 CAPSULE BY MOUTH DAILY BEFORE BREAKFAST., Disp: 90 capsule, Rfl: 0   montelukast  (SINGULAIR ) 10 MG tablet, Take 10 mg by mouth at bedtime., Disp: , Rfl:    ondansetron  (ZOFRAN ) 4 mg TABS tablet, Take by mouth every 8 (eight) hours as needed., Disp: , Rfl:    pantoprazole  (PROTONIX ) 40 MG tablet, Take 1 tablet (40 mg total) by mouth 2 (two) times daily before a meal., Disp: 60 tablet, Rfl:  0   propranolol  (INDERAL ) 20 MG tablet, Take 1 tablet (20 mg total) by mouth 3 (three) times daily as needed., Disp: 90 tablet, Rfl: 3   propranolol  ER (INDERAL  LA) 120 MG 24 hr capsule, Take 120 mg by mouth daily., Disp: , Rfl:    rosuvastatin  (CRESTOR ) 20 MG tablet, Take 20 mg by mouth daily., Disp: , Rfl:    Simethicone 180 MG CAPS, Take 1 capsule by mouth 3 (three) times daily as needed., Disp: , Rfl:    Spacer/Aero-Holding Chambers (AEROCHAMBER MV) inhaler, Use as instructed, Disp: 1 each, Rfl: 0   SPIRIVA  HANDIHALER 18 MCG inhalation capsule, Place 1 capsule (18 mcg total) into inhaler and inhale daily., Disp: 90 capsule, Rfl: 3   SUMAtriptan (IMITREX) 25 MG tablet, Take 25 mg by mouth every 2 (two) hours as needed., Disp: , Rfl:    SYMBICORT  160-4.5 MCG/ACT inhaler, INHALE 2 PUFFS INTO THE LUNGS IN THE MORNING AND AT BEDTIME., Disp: 30.6 each, Rfl: 4   Vibegron  (GEMTESA ) 75 MG TABS, Take 1 tablet (75 mg total) by mouth daily., Disp: 42 tablet, Rfl:   Allergies  Allergen Reactions   Amoxicillin Anaphylaxis and Hives    Objective:   BP 115/65   Pulse 75   Temp 98 F (36.7 C)   Ht 5' 3 (1.6 m)   Wt 255 lb (115.7 kg)   SpO2 96%   BMI 45.17 kg/m      07/24/2024    2:13 PM 07/24/2024    1:58 PM 07/21/2024    3:13 PM  Vitals with BMI  Height  5' 3   Weight  255 lbs   BMI  45.18   Systolic 115 80 99  Diastolic 65 62 64  Pulse  75 90     Physical Exam Vitals and nursing note reviewed.  Constitutional:      Appearance: Normal appearance. She is obese.     Comments: Portable O2 via nasal cannula  HENT:     Head: Normocephalic and atraumatic.  Cardiovascular:     Rate and Rhythm: Normal rate and regular rhythm.     Pulses: Normal pulses.     Heart sounds: Normal heart sounds.  Pulmonary:     Effort: Pulmonary effort is normal.     Breath sounds: Wheezing present.  Skin:    General: Skin is warm and dry.  Neurological:     General: No focal deficit present.      Mental Status: She is alert and oriented to person, place, and time. Mental status is at baseline.  Psychiatric:        Mood and Affect: Mood normal.        Behavior: Behavior normal.        Thought Content: Thought content normal.        Judgment: Judgment normal.     Assessment & Plan:  Generalized anxiety disorder Assessment & Plan: On cymbalta  60mg   daily. Scheduled with psychaitry.   Tobacco dependence Assessment & Plan: 3-5 minute discussion regarding the harms of tobacco use, the benefits of cessation, and methods of cessation. Discussed that there are medication options to help with cessation. Provided printed education on steps to quit smoking. Patient is using nicotine  replacement. History of seizures   Hyperthyroidism Assessment & Plan: TSH today, euthyroid  Orders: -     TSH -     VITAMIN D  25 Hydroxy (Vit-D Deficiency, Fractures) -     Lipid panel -     Comprehensive metabolic panel with GFR -     CBC with Differential/Platelet  Chronic obstructive pulmonary disease, unspecified COPD type (HCC) Assessment & Plan: Continuous O2, followed by Pulm, on Symbicort  BID, Spiriva  daily, Albuterol  PRN, Dupixent , pulm rehab, advised to quit smoking, trying nicotine  replacement  Orders: -     TSH -     VITAMIN D  25 Hydroxy (Vit-D Deficiency, Fractures) -     Lipid panel -     Comprehensive metabolic panel with GFR -     CBC with Differential/Platelet  Gastroesophageal reflux disease without esophagitis Assessment & Plan: Continue Protonix , no red flags, followed by GI.  Orders: -     TSH -     VITAMIN D  25 Hydroxy (Vit-D Deficiency, Fractures) -     Lipid panel -     Comprehensive metabolic panel with GFR -     CBC with Differential/Platelet  Prediabetes Assessment & Plan: A1c 5.6%   OSA (obstructive sleep apnea) Assessment & Plan: On CPAP   Chronic bilateral low back pain with sciatica, sciatica laterality unspecified Assessment & Plan: Upcoming appt  with pain management   Palpitations Assessment & Plan: Being worked up with cardiology. Propanolol could worsen asthma/COPD, discuss this with Cardiology.  Orders: -     TSH -     VITAMIN D  25 Hydroxy (Vit-D Deficiency, Fractures) -     Lipid panel -     Comprehensive metabolic panel with GFR -     CBC with Differential/Platelet  Encounter for screening mammogram for malignant neoplasm of breast -     3D Screening Mammogram, Left and Right     Follow up plan: Return in about 3 months (around 10/24/2024) for chronic follow-up with labs 1 week prior.  Jeoffrey GORMAN Barrio, FNP

## 2024-07-24 NOTE — Assessment & Plan Note (Signed)
A1c   5.6

## 2024-07-24 NOTE — Assessment & Plan Note (Addendum)
 Being worked up with cardiology. Propanolol could worsen asthma/COPD, discuss this with Cardiology.

## 2024-07-24 NOTE — Assessment & Plan Note (Signed)
 On CPAP. ?

## 2024-07-24 NOTE — Assessment & Plan Note (Signed)
 TSH today, euthyroid

## 2024-07-24 NOTE — Assessment & Plan Note (Signed)
 3-5 minute discussion regarding the harms of tobacco use, the benefits of cessation, and methods of cessation. Discussed that there are medication options to help with cessation. Provided printed education on steps to quit smoking. Patient is using nicotine  replacement. History of seizures

## 2024-07-24 NOTE — Assessment & Plan Note (Signed)
 Continue Protonix , no red flags, followed by GI.

## 2024-07-28 DIAGNOSIS — R002 Palpitations: Secondary | ICD-10-CM | POA: Diagnosis not present

## 2024-07-28 DIAGNOSIS — R000 Tachycardia, unspecified: Secondary | ICD-10-CM

## 2024-07-29 ENCOUNTER — Ambulatory Visit: Payer: Self-pay | Admitting: Family Medicine

## 2024-07-29 ENCOUNTER — Encounter: Payer: Self-pay | Admitting: Emergency Medicine

## 2024-07-29 ENCOUNTER — Other Ambulatory Visit: Payer: Self-pay

## 2024-07-29 ENCOUNTER — Encounter (INDEPENDENT_AMBULATORY_CARE_PROVIDER_SITE_OTHER): Payer: Self-pay

## 2024-07-29 LAB — COMPREHENSIVE METABOLIC PANEL WITH GFR
AG Ratio: 1.4 (calc) (ref 1.0–2.5)
ALT: 43 U/L — ABNORMAL HIGH (ref 6–29)
AST: 30 U/L (ref 10–35)
Albumin: 3.7 g/dL (ref 3.6–5.1)
Alkaline phosphatase (APISO): 82 U/L (ref 31–125)
BUN: 7 mg/dL (ref 7–25)
CO2: 28 mmol/L (ref 20–32)
Calcium: 9 mg/dL (ref 8.6–10.2)
Chloride: 102 mmol/L (ref 98–110)
Creat: 0.93 mg/dL (ref 0.50–0.99)
Globulin: 2.6 g/dL (ref 1.9–3.7)
Glucose, Bld: 148 mg/dL — ABNORMAL HIGH (ref 65–99)
Potassium: 4.3 mmol/L (ref 3.5–5.3)
Sodium: 138 mmol/L (ref 135–146)
Total Bilirubin: 0.3 mg/dL (ref 0.2–1.2)
Total Protein: 6.3 g/dL (ref 6.1–8.1)
eGFR: 76 mL/min/1.73m2 (ref >=60)

## 2024-07-29 LAB — TEST AUTHORIZATION

## 2024-07-29 LAB — VITAMIN D 25 HYDROXY (VIT D DEFICIENCY, FRACTURES): Vit D, 25-Hydroxy: 26 ng/mL — ABNORMAL LOW (ref 30–100)

## 2024-07-29 LAB — CBC WITH DIFFERENTIAL/PLATELET
Absolute Lymphocytes: 2171 {cells}/uL (ref 850–3900)
Absolute Monocytes: 516 {cells}/uL (ref 200–950)
Basophils Absolute: 54 {cells}/uL (ref 0–200)
Basophils Relative: 0.8 %
Eosinophils Absolute: 201 {cells}/uL (ref 15–500)
Eosinophils Relative: 3 %
HCT: 38.9 % (ref 35.0–45.0)
Hemoglobin: 12.8 g/dL (ref 11.7–15.5)
MCH: 31.2 pg (ref 27.0–33.0)
MCHC: 32.9 g/dL (ref 32.0–36.0)
MCV: 94.9 fL (ref 80.0–100.0)
MPV: 10.4 fL (ref 7.5–12.5)
Monocytes Relative: 7.7 %
Neutro Abs: 3759 {cells}/uL (ref 1500–7800)
Neutrophils Relative %: 56.1 %
Platelets: 258 Thousand/uL (ref 140–400)
RBC: 4.1 Million/uL (ref 3.80–5.10)
RDW: 14.6 % (ref 11.0–15.0)
Total Lymphocyte: 32.4 %
WBC: 6.7 Thousand/uL (ref 3.8–10.8)

## 2024-07-29 LAB — LIPID PANEL
Cholesterol: 222 mg/dL — ABNORMAL HIGH (ref <200)
HDL: 34 mg/dL — ABNORMAL LOW (ref >=50)
LDL Cholesterol (Calc): 139 mg/dL — ABNORMAL HIGH
Non-HDL Cholesterol (Calc): 188 mg/dL — ABNORMAL HIGH (ref <130)
Total CHOL/HDL Ratio: 6.5 (calc) — ABNORMAL HIGH (ref <5.0)
Triglycerides: 346 mg/dL — ABNORMAL HIGH (ref <150)

## 2024-07-29 LAB — HEMOGLOBIN A1C
Hgb A1c MFr Bld: 6.8 % — ABNORMAL HIGH (ref <5.7)
Mean Plasma Glucose: 148 mg/dL
eAG (mmol/L): 8.2 mmol/L

## 2024-07-29 LAB — TSH: TSH: 3.27 m[IU]/L

## 2024-07-30 ENCOUNTER — Other Ambulatory Visit (HOSPITAL_COMMUNITY): Payer: Self-pay

## 2024-07-30 ENCOUNTER — Encounter: Payer: Self-pay | Admitting: Pain Medicine

## 2024-07-30 ENCOUNTER — Other Ambulatory Visit: Payer: Self-pay

## 2024-07-30 ENCOUNTER — Ambulatory Visit: Attending: Pain Medicine | Admitting: Pain Medicine

## 2024-07-30 VITALS — BP 133/66 | HR 88 | Temp 97.2°F | Resp 17 | Ht 63.0 in | Wt 258.6 lb

## 2024-07-30 DIAGNOSIS — M542 Cervicalgia: Secondary | ICD-10-CM | POA: Insufficient documentation

## 2024-07-30 DIAGNOSIS — M79601 Pain in right arm: Secondary | ICD-10-CM | POA: Insufficient documentation

## 2024-07-30 DIAGNOSIS — M25511 Pain in right shoulder: Secondary | ICD-10-CM | POA: Insufficient documentation

## 2024-07-30 DIAGNOSIS — M5412 Radiculopathy, cervical region: Secondary | ICD-10-CM | POA: Diagnosis present

## 2024-07-30 DIAGNOSIS — M79604 Pain in right leg: Secondary | ICD-10-CM | POA: Insufficient documentation

## 2024-07-30 DIAGNOSIS — M79605 Pain in left leg: Secondary | ICD-10-CM | POA: Insufficient documentation

## 2024-07-30 DIAGNOSIS — M25512 Pain in left shoulder: Secondary | ICD-10-CM | POA: Insufficient documentation

## 2024-07-30 DIAGNOSIS — Z6841 Body Mass Index (BMI) 40.0 and over, adult: Secondary | ICD-10-CM | POA: Insufficient documentation

## 2024-07-30 DIAGNOSIS — Z789 Other specified health status: Secondary | ICD-10-CM | POA: Diagnosis present

## 2024-07-30 DIAGNOSIS — G894 Chronic pain syndrome: Secondary | ICD-10-CM | POA: Diagnosis present

## 2024-07-30 DIAGNOSIS — E662 Morbid (severe) obesity with alveolar hypoventilation: Secondary | ICD-10-CM | POA: Insufficient documentation

## 2024-07-30 DIAGNOSIS — G8929 Other chronic pain: Secondary | ICD-10-CM | POA: Diagnosis present

## 2024-07-30 DIAGNOSIS — M545 Low back pain, unspecified: Secondary | ICD-10-CM | POA: Diagnosis present

## 2024-07-30 DIAGNOSIS — M899 Disorder of bone, unspecified: Secondary | ICD-10-CM | POA: Insufficient documentation

## 2024-07-30 DIAGNOSIS — M25551 Pain in right hip: Secondary | ICD-10-CM | POA: Diagnosis present

## 2024-07-30 DIAGNOSIS — E66813 Obesity, class 3: Secondary | ICD-10-CM | POA: Diagnosis present

## 2024-07-30 DIAGNOSIS — R937 Abnormal findings on diagnostic imaging of other parts of musculoskeletal system: Secondary | ICD-10-CM | POA: Insufficient documentation

## 2024-07-30 DIAGNOSIS — Z79899 Other long term (current) drug therapy: Secondary | ICD-10-CM | POA: Insufficient documentation

## 2024-07-30 DIAGNOSIS — M25552 Pain in left hip: Secondary | ICD-10-CM | POA: Diagnosis present

## 2024-07-30 DIAGNOSIS — K589 Irritable bowel syndrome without diarrhea: Secondary | ICD-10-CM | POA: Insufficient documentation

## 2024-07-30 NOTE — Progress Notes (Signed)
 Safety precautions to be maintained throughout the outpatient stay will include: orient to surroundings, keep bed in low position, maintain call bell within reach at all times, provide assistance with transfer out of bed and ambulation.

## 2024-07-30 NOTE — Progress Notes (Signed)
 Specialty Pharmacy Refill Coordination Note  MyChart Questionnaire Submission  Lindsay Harris is a 48 y.o. female contacted today regarding refills of specialty medication(s) Dupixent .  Doses on hand: (Patient-Rptd) 1 - for 07/30/24  Injection date: 08/13/24  Patient requested: (Patient-Rptd) Delivery   Delivery date: 08/01/24  Verified address: [address] [place] [address]  Medication will be filled on 07/31/24.

## 2024-07-30 NOTE — Progress Notes (Signed)
 Clinical Intervention Note  Clinical Intervention Notes: Patient reported starting Gemtesa  for new condition overactive bladder. Assessed for drug-drug and drug-disease interactions; none found. Dupixent  use has not been associated with development of overactive bladder.   Clinical Intervention Outcomes: Prevention of an adverse drug event   Harris Lindsay Gallus Specialty Pharmacist

## 2024-07-30 NOTE — Progress Notes (Signed)
 PROVIDER NOTE: Interpretation of information contained herein should be left to medically-trained personnel. Specific patient instructions are provided elsewhere under Patient Instructions section of medical record. This document was created in part using AI and STT-dictation technology, any transcriptional errors that may result from this process are unintentional.  Patient: Lindsay Harris  Service: E/M Encounter  Provider: Eric DELENA Como, MD  DOB: [birth year]  Delivery: Face-to-face  Specialty: Interventional Pain Management  MRN: [id number]  Setting: Ambulatory outpatient facility  Specialty designation: 09  Type: New Patient  Location: Outpatient office facility  PCP: Kayla Jeoffrey RAMAN, FNP  DOS: 07/30/2024    Referring Prov.: Kayla Jeoffrey RAMAN, FNP   Primary Reason(s) for Visit: Encounter for initial evaluation of one or more chronic problems (new to examiner) potentially causing chronic pain, and posing a threat to normal musculoskeletal function. (Level of risk: High) CC: Neck Pain and Back Pain (lower)  HPI  Lindsay Harris is a 48 y.o. year old, female patient, who comes for the first time to our practice referred by Kayla Jeoffrey RAMAN, FNP for our initial evaluation of her chronic pain. She has Tobacco dependence; Dyspareunia in female; Bilateral ovarian cysts; Rectal bleeding; Adenomatous polyp of sigmoid colon; Cervical neck pain with evidence of disc disease; Generalized anxiety disorder; Hyperthyroidism; Tobacco abuse; Borderline diabetes; COPD (chronic obstructive pulmonary disease) (HCC); Disc degeneration, lumbar; Endometriosis; GERD (gastroesophageal reflux disease); High cholesterol; Hyperglycemia; Migraines; Sleep apnea; Acute encephalopathy; Chronic respiratory failure with hypoxia (HCC); Chronic pain syndrome; Obesity, Class III, BMI 40-49.9 (morbid obesity); Palpitations; OSA (obstructive sleep apnea); Prediabetes; Encounter to establish care with new provider; Chronic low back pain  (Midline) (1ry area of Pain) w/ sciatica (Right); IBS (irritable bowel syndrome); OAB (overactive bladder); Pharmacologic therapy; Disorder of skeletal system; Problems influencing health status; Abnormal MRI, lumbar spine (11/12/2023) ([facility]); Chronic lower extremity pain (Right); Cervicalgia; Chronic neck pain (Bilateral); Chronic shoulder pain (Bilateral) (R>L); Chronic lower extremity pain (Right); Cervical radiculitis (Right); Chronic low back pain (Bilateral) w/o sciatica; Low back pain of over 3 months duration; Low back pain radiating to both legs; Multifactorial low back pain; 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 Today she comes in for evaluation of her Neck Pain and Back Pain (lower)  Pain Assessment: Location:   Neck Radiating: to shoulders bilat, down right arm to fingers, when really bad, down to mid back; low back always hurts; Right shoulder worse Onset: More than a month ago Duration: Chronic pain Quality: Shooting, Stabbing Severity: 8 /10 (subjective, self-reported pain score)  Effect on ADL: limits adls Timing: Constant Modifying factors: meds, heating pads BP: 133/66  HR: 88  Onset and Duration: Present longer than 3 months Cause of pain: Work related accident or event Severity: Getting worse, NAS-11 at its worse: 10/10, NAS-11 at its best: 8/10, NAS-11 now: 9/10, and NAS-11 on the average: 10/10 Timing: Not influenced by the time of the day, During activity or exercise, After activity or exercise, and After a period of immobility Aggravating Factors: Bending, Climbing, Kneeling, Lifiting, Motion, Prolonged sitting, Prolonged standing, Squatting, Twisting, Walking, Walking uphill, Walking downhill, and Working Alleviating Factors: Hot packs Associated Problems: Day-time cramps, Night-time cramps, Dizziness,  Fatigue, Inability to concentrate, Inability to control bladder (urine), Nausea, Numbness, Personality changes, Spasms, Sweating, Swelling, Tingling, Vomiting , Weakness, Pain that wakes patient up, and Pain that does not allow patient to  sleep Quality of Pain: Aching, Agonizing, Cramping, Deep, Exhausting, Sharp, Shooting, Stabbing, Tender, Throbbing, and Uncomfortable Previous Examinations or Tests: CT scan, MRI scan, and X-rays Previous Treatments: The patient denies any previous treatments  Lindsay Harris is being evaluated for possible interventional pain management therapies for the treatment of her chronic pain.  Discussed the use of AI scribe software for clinical note transcription with the patient, who gave verbal consent to proceed.  History of Present Illness   Lindsay Harris is a 48 year old female who presents with severe pain preventing completion of physical therapy. She was referred by her COPD doctor for pain management evaluation.  She experiences severe pain due to a bulging disc at L4-L5 or L5-S1 and a pinched sciatic nerve, which began after a fall at the end of last year. This pain prevents her from completing physical therapy. Injections in her hip and lower back provide temporary relief for about a week but do not offer sustained improvement. Her pain management is further complicated by respiratory issues related to chronic obstructive pulmonary disease (COPD).     The patient primary area of pain is that of the neck (posterior aspect).  This neck pain appears to refer pain to both shoulder regions with the right being worse than the left.  The patient also indicates that the pain goes down the right upper extremity affecting the middle finger, ring finger, and pinky finger, and what appears to be the distribution of C7/C8.  She states that when the pain is really bad it tends to be referred towards the mid upper back.  The patient denies any cervical spine surgery or  interventional procedures in the cervical region.  The patient has secondary area of pain is that of the lower back (Bilateral) with pain being referred towards the buttocks (Bilateral) and down the posterior aspect of the legs to the knee (Bilateral).  The patient denies any prior back surgeries or interventional procedures in the lower back region.  She does admit to having had some injections in the hip area by [facility] orthopedic department.  Medications used for analgesia include gabapentin  and Flexeril.  Medical problems are significant for COPD and been a current smoker.  She is on constant oxygen at 3 L/min.  She is using a walker to ambulate.  Lindsay Harris has been informed that this initial visit was an evaluation only.  On the follow up appointment I will go over the results, including ordered tests and available interventional therapies. At that time she will have the opportunity to decide whether to proceed with offered therapies or not. In the event that Lindsay Harris prefers avoiding interventional options, this will conclude our involvement in the case.  Medication management recommendations may be provided upon request.  Patient informed that diagnostic tests may be ordered to assist in identifying underlying causes, narrow the list of differential diagnoses and aid in determining candidacy for (or contraindications to) planned therapeutic interventions.  Historic Controlled Substance Pharmacotherapy Review PMP and historical list of controlled substances: Gabapentin  600 mg tablet, 1 tab p.o. daily (# 90) (last filled on 05/31/2024); gabapentin  100 mg capsule, 4 capsules p.o. 3 times daily (360/month) (# 360) (last filled on 04/25/2024); Eszopiclone 1 Mg Tablet, 1 tab p.o. daily (# 15) (last filled on 03/30/2024); oxycodone  IR 5 mg tablet, 1 tab p.o. 3 times daily (# 9) (last filled on 08/23/2022) Most recently prescribed controlled substance(s): Opioid Analgesic: None MME/day: 0  mg/day  Historical Monitoring: The patient  reports no history of drug use. List of prior UDS Testing: Lab Results  Component Value Date   [facility] <15 04/20/2024   Historical Background Evaluation: [place] PMP: PDMP reviewed during this encounter. Review of the past 69-months conducted.             PMP NARX Score Report:  Narcotic: 070 Sedative: 110 Stimulant: 000 [place] Department of public safety, offender search: Engineer, mining Information) Non-contributory Risk Assessment Profile: Aberrant behavior: None observed or detected today Risk factors for fatal opioid overdose: None identified today PMP NARX Overdose Risk Score: 310 Fatal overdose hazard ratio (HR): Calculation deferred Non-fatal overdose hazard ratio (HR): Calculation deferred Risk of opioid abuse or dependence: 0.7-3.0% with doses <= 36 MME/day and 6.1-26% with doses >= 120 MME/day. Substance use disorder (SUD) risk level: See below Personal History of Substance Abuse (SUD-Substance use disorder):  Alcohol: Negative  Illegal Drugs: Negative  Rx Drugs: Negative  ORT Risk Level calculation: Low Risk  Opioid Risk Tool - 07/30/24 0805       Family History of Substance Abuse   Alcohol Negative    Illegal Drugs Negative    Rx Drugs Negative      Personal History of Substance Abuse   Alcohol Negative    Illegal Drugs Negative    Rx Drugs Negative      Age   Age between 53-45 years  No      Psychological Disease   Psychological Disease Negative    Depression Negative      Total Score   Opioid Risk Tool Scoring 0    Opioid Risk Interpretation Low Risk         ORT Scoring interpretation table:  Score <3 = Low Risk for SUD  Score between 4-7 = Moderate Risk for SUD  Score >8 = High Risk for Opioid Abuse   PHQ-2 Depression Scale:  Total score: 0  PHQ-2 Scoring interpretation table: (Score and probability of major depressive disorder)  Score 0 = No depression  Score 1 = 15.4% Probability  Score 2 = 21.1% Probability   Score 3 = 38.4% Probability  Score 4 = 45.5% Probability  Score 5 = 56.4% Probability  Score 6 = 78.6% Probability   PHQ-9 Depression Scale:  Total score: 0  PHQ-9 Scoring interpretation table:  Score 0-4 = No depression  Score 5-9 = Mild depression  Score 10-14 = Moderate depression  Score 15-19 = Moderately severe depression  Score 20-27 = Severe depression (2.4 times higher risk of SUD and 2.89 times higher risk of overuse)   Pharmacologic Plan: As per protocol, I have not taken over any controlled substance management, pending the results of ordered tests and/or consults.            Initial impression: Pending review of available data and ordered tests.  Meds   Current Outpatient Medications:    albuterol  (PROVENTIL ) (2.5 MG/3ML) 0.083% nebulizer solution, Take 2.5 mg by nebulization every 4 (four) hours as needed., Disp: , Rfl:    albuterol  (VENTOLIN  HFA) 108 (90 Base) MCG/ACT inhaler, Inhale 2 puffs into the lungs every 6 (six) hours as needed., Disp: 3 each, Rfl: 3   cyclobenzaprine (FLEXERIL) 5 MG tablet, Take 5 mg by mouth 3 (three) times daily as needed for muscle spasms., Disp: , Rfl:    DULoxetine  (CYMBALTA ) 60 MG capsule, Take 60 mg by mouth 2 (two) times daily., Disp: , Rfl:    Dupilumab  (DUPIXENT ) 300 MG/2ML SOAJ, Inject 300 mg into the skin  every 14 (fourteen) days., Disp: 12 mL, Rfl: 1   Elagolix Sodium  (ORILISSA ) 200 MG TABS, Take 1 tablet (200 mg total) by mouth 2 (two) times daily., Disp: 60 tablet, Rfl: 6   EPINEPHrine 0.3 mg/0.3 mL IJ SOAJ injection, Inject 0.3 mg into the muscle as needed for anaphylaxis., Disp: , Rfl:    eszopiclone (LUNESTA) 1 MG TABS tablet, Take 1 mg by mouth at bedtime as needed., Disp: , Rfl:    furosemide (LASIX) 20 MG tablet, Take 20 mg by mouth daily as needed., Disp: , Rfl:    gabapentin  (NEURONTIN ) 100 MG capsule, Take 400 mg by mouth 2 (two) times daily. Take 400mg  in the morning and 400mg  in the afternoon., Disp: , Rfl:     gabapentin  (NEURONTIN ) 600 MG tablet, Take 600 mg by mouth at bedtime., Disp: , Rfl:    lactulose  (CHRONULAC ) 10 GM/15ML solution, Take 30 mLs (20 g total) by mouth daily., Disp: 900 mL, Rfl: 3   linaclotide  (LINZESS ) 290 MCG CAPS capsule, TAKE 1 CAPSULE BY MOUTH DAILY BEFORE BREAKFAST., Disp: 90 capsule, Rfl: 0   montelukast  (SINGULAIR ) 10 MG tablet, Take 10 mg by mouth at bedtime., Disp: , Rfl:    ondansetron  (ZOFRAN ) 4 mg TABS tablet, Take by mouth every 8 (eight) hours as needed., Disp: , Rfl:    pantoprazole  (PROTONIX ) 40 MG tablet, Take 1 tablet (40 mg total) by mouth 2 (two) times daily before a meal., Disp: 60 tablet, Rfl: 0   propranolol  (INDERAL ) 20 MG tablet, Take 1 tablet (20 mg total) by mouth 3 (three) times daily as needed., Disp: 90 tablet, Rfl: 3   propranolol  ER (INDERAL  LA) 120 MG 24 hr capsule, Take 120 mg by mouth daily., Disp: , Rfl:    rosuvastatin  (CRESTOR ) 20 MG tablet, Take 20 mg by mouth daily., Disp: , Rfl:    Simethicone 180 MG CAPS, Take 1 capsule by mouth 3 (three) times daily as needed., Disp: , Rfl:    Spacer/Aero-Holding Chambers (AEROCHAMBER MV) inhaler, Use as instructed, Disp: 1 each, Rfl: 0   SPIRIVA  HANDIHALER 18 MCG inhalation capsule, Place 1 capsule (18 mcg total) into inhaler and inhale daily., Disp: 90 capsule, Rfl: 3   SUMAtriptan (IMITREX) 25 MG tablet, Take 25 mg by mouth every 2 (two) hours as needed., Disp: , Rfl:    SYMBICORT  160-4.5 MCG/ACT inhaler, INHALE 2 PUFFS INTO THE LUNGS IN THE MORNING AND AT BEDTIME., Disp: 30.6 each, Rfl: 4   Vibegron  (GEMTESA ) 75 MG TABS, Take 1 tablet (75 mg total) by mouth daily., Disp: 42 tablet, Rfl:   Imaging Review  Hand Imaging: Hand-R DG Complete: Results for orders placed in visit on 02/23/01 DG Hand Complete Right  Narrative FINDINGS CLINICAL DATA:  LACERATION WITH GLASS. RIGHT HAND, (THREE VIEWS): NO COMPARISON.  THERE IS NO EVIDENCE OF ACUTE FRACTURE, SUBLUXATION OR RADIOPAQUE FOREIGN  BODY. IMPRESSION NEGATIVE.  Complexity Note: Imaging results reviewed.                         ROS  Cardiovascular: Abnormal heart rhythm Pulmonary or Respiratory: Lung problems, Wheezing and difficulty taking a deep full breath (Asthma), Difficulty blowing air out (Emphysema), Shortness of breath, Smoking, Coughing up mucus (Bronchitis), and Temporary stoppage of breathing during sleep Neurological: No reported neurological signs or symptoms such as seizures, abnormal skin sensations, urinary and/or fecal incontinence, being born with an abnormal open spine and/or a tethered spinal cord Psychological-Psychiatric: Anxiousness and Difficulty sleeping  and or falling asleep Gastrointestinal: Reflux or heatburn, Alternating episodes iof diarrhea and constipation (IBS-Irritable bowe syndrome), and Irregular, infrequent bowel movements (Constipation) Genitourinary: No reported renal or genitourinary signs or symptoms such as difficulty voiding or producing urine, peeing blood, non-functioning kidney, kidney stones, difficulty emptying the bladder, difficulty controlling the flow of urine, or chronic kidney disease Hematological: Brusing easily Endocrine: No reported endocrine signs or symptoms such as high or low blood sugar, rapid heart rate due to high thyroid levels, obesity or weight gain due to slow thyroid or thyroid disease Rheumatologic: No reported rheumatological signs and symptoms such as fatigue, joint pain, tenderness, swelling, redness, heat, stiffness, decreased range of motion, with or without associated rash Musculoskeletal: Negative for myasthenia gravis, muscular dystrophy, multiple sclerosis or malignant hyperthermia Work History: Out of work due to pain  Allergies  Lindsay Harris is allergic to amoxicillin.  Laboratory Chemistry Profile   Renal Lab Results  Component Value Date   BUN 7 07/24/2024   CREATININE 0.93 07/24/2024   BCR SEE NOTE: 07/24/2024   GFRAA >60 12/30/2015    GFRNONAA >60 05/08/2024   SPECGRAV 1.015 07/21/2024   PHUR 6.5 07/21/2024   PROTEINUR Negative 07/21/2024     Electrolytes Lab Results  Component Value Date   NA 138 07/24/2024   K 4.3 07/24/2024   CL 102 07/24/2024   CALCIUM  9.0 07/24/2024     Hepatic Lab Results  Component Value Date   AST 30 07/24/2024   ALT 43 (H) 07/24/2024   ALBUMIN 4.0 05/07/2024   ALKPHOS 89 05/07/2024   LIPASE 38 06/07/2023     ID Lab Results  Component Value Date   HIV Non Reactive 04/21/2024   SARSCOV2NAA NEGATIVE 05/07/2024   PREGTESTUR NEGATIVE 08/09/2023     Bone Lab Results  Component Value Date   VD25OH 26 (L) 07/24/2024     Endocrine Lab Results  Component Value Date   GLUCOSE 148 (H) 07/24/2024   GLUCOSEU Trace (A) 07/21/2024   HGBA1C 6.8 (H) 07/24/2024   TSH 3.27 07/24/2024   FREET4 0.95 09/04/2023     Neuropathy Lab Results  Component Value Date   HGBA1C 6.8 (H) 07/24/2024   HIV Non Reactive 04/21/2024     CNS No results found for: COLORCSF, APPEARCSF, RBCCOUNTCSF, WBCCSF, POLYSCSF, LYMPHSCSF, EOSCSF, PROTEINCSF, GLUCCSF, JCVIRUS, CSFOLI, IGGCSF, LABACHR, ACETBL   Inflammation (CRP: Acute  ESR: Chronic) Lab Results  Component Value Date   LATICACIDVEN 1.4 08/23/2022     Rheumatology No results found for: RF, ANA, LABURIC, URICUR, LYMEIGGIGMAB, LYMEABIGMQN, HLAB27   Coagulation Lab Results  Component Value Date   INR 0.9 04/20/2024   LABPROT 12.6 04/20/2024   APTT 28 04/20/2024   PLT 258 07/24/2024     Cardiovascular Lab Results  Component Value Date   BNP 55.6 05/07/2024   HGB 12.8 07/24/2024   HCT 38.9 07/24/2024     Screening Lab Results  Component Value Date   SARSCOV2NAA NEGATIVE 05/07/2024   COVIDSOURCE NASOPHARYNGEAL 07/14/2019   HIV Non Reactive 04/21/2024   PREGTESTUR NEGATIVE 08/09/2023     Cancer No results found for: CEA, CA125, LABCA2   Allergens No results found for: ALMOND,  APPLE, ASPARAGUS, AVOCADO, BANANA, BARLEY, BASIL, BAYLEAF, GREENBEAN, LIMABEAN, WHITEBEAN, BEEFIGE, REDBEET, BLUEBERRY, BROCCOLI, CABBAGE, MELON, CARROT, CASEIN, CASHEWNUT, CAULIFLOWER, CELERY     Note: Lab results reviewed.  PFSH  Drug: Lindsay Harris  reports no history of drug use. Alcohol:  reports no history of alcohol use. Tobacco:  reports that she  has been smoking cigarettes. She started smoking about 38 years ago. She has a 38.6 pack-year smoking history. She does not have any smokeless tobacco history on file. Medical:  has a past medical history of Asthma, COPD (chronic obstructive pulmonary disease) (HCC), Dyspnea, GERD (gastroesophageal reflux disease), Headache, and Thyroid disease ([id number]). Family: family history includes Anemia in her mother; Asthma in her maternal grandfather and mother; Breast cancer in her maternal grandmother; Colon cancer in her maternal uncle; Colon cancer (age of onset: 40) in her mother; Heart attack in her maternal grandfather; Heart disease in her maternal grandfather; Heart failure in her maternal grandfather; Hypertension in her maternal grandfather; Ovarian cancer (age of onset: 78) in her sister.  Past Surgical History:  Procedure Laterality Date   COLONOSCOPY WITH PROPOFOL  N/A 08/09/2023   Procedure: COLONOSCOPY WITH BIOPSY;  Surgeon: Unk Corinn Skiff, MD;  Location: [facility];  Service: Endoscopy;  Laterality: N/A;   POLYPECTOMY N/A 08/09/2023   Procedure: POLYPECTOMY;  Surgeon: Unk Corinn Skiff, MD;  Location: [facility];  Service: Endoscopy;  Laterality: N/A;   Active Ambulatory Problems    Diagnosis Date Noted   Tobacco dependence 06/29/2023   Dyspareunia in female 06/29/2023   Bilateral ovarian cysts 06/29/2023   Rectal bleeding 08/09/2023   Adenomatous polyp of sigmoid colon 08/09/2023   Cervical neck pain with evidence of disc disease 12/27/2022   Generalized anxiety disorder  02/02/2023   Hyperthyroidism 04/24/2023   Tobacco abuse 05/24/1987   Borderline diabetes 01/26/2023   COPD (chronic obstructive pulmonary disease) (HCC) 03/30/2023   Disc degeneration, lumbar 11/04/2023   Endometriosis 08/09/2023   GERD (gastroesophageal reflux disease) 01/26/2023   High cholesterol 10/26/2023   Hyperglycemia 06/14/2023   Migraines 01/26/2023   Sleep apnea 07/02/2023   Acute encephalopathy 04/21/2024   Chronic respiratory failure with hypoxia (HCC) 04/21/2024   Chronic pain syndrome 04/21/2024   Obesity, Class III, BMI 40-49.9 (morbid obesity) 05/08/2024   Palpitations 06/15/2024   OSA (obstructive sleep apnea) 06/23/2024   Prediabetes 06/23/2024   Encounter to establish care with new provider 06/23/2024   Chronic low back pain (Midline) (1ry area of Pain) w/ sciatica (Right) 07/24/2024   IBS (irritable bowel syndrome) 07/30/2024   OAB (overactive bladder) 03/04/2022   Pharmacologic therapy 07/30/2024   Disorder of skeletal system 07/30/2024   Problems influencing health status 07/30/2024   Abnormal MRI, lumbar spine (11/12/2023) ([facility]) 07/30/2024   Chronic lower extremity pain (Right) 07/30/2024   Cervicalgia 07/30/2024   Chronic neck pain (Bilateral) 07/30/2024   Chronic shoulder pain (Bilateral) (R>L) 07/30/2024   Chronic lower extremity pain (Right) 07/30/2024   Cervical radiculitis (Right) 07/30/2024   Chronic low back pain (Bilateral) w/o sciatica 07/30/2024   Low back pain of over 3 months duration 07/30/2024   Low back pain radiating to both legs 07/30/2024   Multifactorial low back pain 07/30/2024   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 In no apparent acute distress Vitals:   07/30/24 0816  BP: 133/66  Pulse:  88  Resp: 17  Temp: (!) 97.2 F (36.2 C)  TempSrc: Temporal  SpO2: 98%  Weight: 258 lb 9.6 oz (117.3 kg)  Height: 5' 3 (1.6 m)   BMI Assessment: Estimated body mass index is 45.81 kg/m as calculated from the following:   Height as of this encounter: 5' 3 (1.6 m).   Weight as of this encounter: 258 lb 9.6 oz (117.3 kg).  BMI interpretation table: BMI level Category Range association with higher incidence of chronic pain  <18 kg/m2 Underweight   18.5-24.9 kg/m2 Ideal body weight   25-29.9 kg/m2 Overweight Increased incidence by 20%  30-34.9 kg/m2 Obese (Class I) Increased incidence by 68%  35-39.9 kg/m2 Severe obesity (Class II) Increased incidence by 136%  >40 kg/m2 Extreme obesity (Class III) Increased incidence by 254%   Patient's current BMI Ideal Body weight  Body mass index is 45.81 kg/m. Ideal body weight: 52.4 kg (115 lb 8.3 oz) Adjusted ideal body weight: 78.4 kg (172 lb 12 oz)   BMI Readings from Last 4 Encounters:  07/30/24 45.81 kg/m  07/24/24 45.17 kg/m  06/23/24 41.81 kg/m  06/16/24 44.68 kg/m   Wt Readings from Last 4 Encounters:  07/30/24 258 lb 9.6 oz (117.3 kg)  07/24/24 255 lb (115.7 kg)  06/23/24 236 lb (107 kg)  06/16/24 252 lb 4 oz (114.4 kg)    Psych/Mental status: Alert, oriented x 3 (person, place, & time)       Eyes: PERLA Respiratory: No evidence of acute respiratory distress  Assessment  Primary Diagnosis & Pertinent Problem List: The primary encounter diagnosis was Chronic pain syndrome. Diagnoses of Cervicalgia, Chronic neck pain (Bilateral), Chronic shoulder pain (Bilateral) (R>L), Chronic lower extremity pain (Right), Cervical radiculitis (Right), Chronic low back pain (Bilateral) w/o sciatica, Low back pain of  over 3 months duration, Low back pain radiating to both legs, Multifactorial low back pain, Class 3 obesity with alveolar hypoventilation, serious comorbidity, and body mass index (BMI) of 45.0 to 49.9 in adult [facility]), Chronic hip pain (Bilateral), Chronic lower extremity pain (Bilateral), Pharmacologic therapy, Disorder of skeletal system, Problems influencing health status, and Abnormal MRI, lumbar spine (11/12/2023) ([facility]) were also pertinent to this visit.  Visit Diagnosis (New problems to examiner): 1. Chronic pain syndrome   2. Cervicalgia   3. Chronic neck pain (Bilateral)   4. Chronic shoulder pain (Bilateral) (R>L)   5. Chronic lower extremity pain (Right)   6. Cervical radiculitis (Right)   7. Chronic low back pain (Bilateral) w/o sciatica   8. Low back pain of over 3 months duration   9. Low back pain radiating to both legs   10. Multifactorial low back pain   11. Class 3 obesity with alveolar hypoventilation, serious comorbidity, and body mass index (BMI) of 45.0 to 49.9 in adult (HCC)   12. Chronic hip pain (Bilateral)   13. Chronic lower extremity pain (Bilateral)   14. Pharmacologic therapy   15. Disorder of skeletal system   16. Problems influencing health status   17. Abnormal MRI, lumbar spine (11/12/2023) ([facility])    Plan of Care (Initial workup plan)  Note: Lindsay Harris was reminded that as per protocol, today's visit has been an evaluation only. We have not taken over the patient's controlled substance management.  Problem-specific plan: Assessment and Plan    Chronic low back pain with sciatica   Chronic low back pain with sciatica is likely due to a bulging disc at L4-L5 or L5-S1 and a pinched sciatic nerve. Pain  is severe and persistent, limiting her ability to participate in physical therapy. Previous interventions, including injections in the hip and lower back, provided only temporary relief.  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 The condition is chronic and resistant to physical therapy.       Lab Orders         Compliance Drug Analysis, Ur         Comp. Metabolic Panel (12)         Magnesium          Vitamin B12         Sedimentation rate         25-Hydroxy vitamin D  Lcms D2+D3         C-reactive protein     Imaging Orders         DG Cervical Spine With Flex & Extend         DG Lumbar Spine Complete W/Bend         DG Shoulder Right         DG Shoulder Left         DG HIPS BILAT W OR W/O PELVIS MIN 5 VIEWS     Referral Orders  No referral(s) requested today   Procedure Orders    No procedure(s) ordered today   Pharmacotherapy (current): Medications ordered:  No orders of the defined types were placed in this encounter.  Medications administered during this visit: Hiromi K. Lansdale had no medications administered during this visit.   Analgesic Pharmacotherapy:  Opioid Analgesics: For patients currently taking or requesting to take opioid analgesics, in accordance with [place]  Medical Board Guidelines, we will assess their risks and indications for the use of these substances. After completing our evaluation, we may offer recommendations, but we no longer take patients for medication management. The prescribing physician will ultimately decide, based on his/her training and level of comfort whether to adopt any of the recommendations, including whether or not to prescribe such medicines.  Membrane stabilizer: To be determined at a later time  Muscle relaxant: To be determined at a later time  NSAID: To be determined at a later time  Other analgesic(s): To be determined at a later time   Interventional management options: Lindsay Harris was informed that there is no guarantee that she would be a candidate for interventional therapies. The decision will be based on the results of diagnostic  studies, as well as Lindsay Harris's risk profile.  Procedure(s) under consideration:  Pending results of ordered studies     Interventional Therapies  Risk Factors  Considerations  Medical Comorbidities:     Planned  Pending:      Under consideration:   Pending   Completed: (Analgesic benefit)1  None at this time   Therapeutic  Palliative (PRN) options:   None established   Completed by other providers:   Therapeutic right greater trochanteric bursa inj. x1 by Debby Amber, PA (11/30/2023) [facility])  Therapeutic left greater trochanteric bursa inj. x1 by Debby Amber, PA (03/26/2024) ([facility])   1(Analgesic benefit): Expressed in percentage (%). (Local anesthetic[LA] +/- sedation  L.A.Local Anesthetic  Steroid benefit  Ongoing benefit)   Provider-requested follow-up: Return in about 2 weeks (around 08/13/2024) for ( ), Eval-day (M,W), (Face2F), 2nd Visit, to review of ordered test(s).  Future Appointments  Date Time Provider Department Center  08/06/2024  3:00 PM [facility] None  08/20/2024 10:00 AM Tanya Glisson, MD [facility] None  09/29/2024  3:45 PM Assaker, Darrin, MD [facility] None  10/06/2024  3:00 PM Gaston Hamilton, MD [facility] None  10/22/2024  2:00 PM [facility]  10/27/2024  2:00 PM Kayla Jeoffrey RAMAN, FNP [facility]   I discussed the assessment and treatment plan with the patient. The patient was provided an opportunity to ask questions and all were answered. The patient agreed with the plan and demonstrated an understanding of the instructions.  Patient advised to call back or seek an in-person evaluation if the symptoms or condition worsens.  Duration of encounter: 46 minutes.  Total time on encounter, as per AMA guidelines included both the face-to-face and non-face-to-face time personally spent by the physician and/or other qualified health care professional(s) on the day of the encounter  (includes time in activities that require the physician or other qualified health care professional and does not include time in activities normally performed by clinical staff). Physician's time may include the following activities when performed: Preparing to see the patient (e.g., pre-charting review of records, searching for previously ordered imaging, lab work, and nerve conduction tests) Review of prior analgesic pharmacotherapies. Reviewing PMP Interpreting ordered tests (e.g., lab work, imaging, nerve conduction tests) Performing post-procedure evaluations, including interpretation of diagnostic procedures Obtaining and/or reviewing separately obtained history Performing a medically appropriate examination and/or evaluation Counseling and educating the patient/family/caregiver Ordering medications, tests, or procedures Referring and communicating with other health care professionals (when not separately reported) Documenting clinical information in the electronic or other health record Independently interpreting results (not separately reported) and communicating results to the patient/ family/caregiver Care coordination (not separately reported)  Note by: Glisson DELENA Tanya, MD (TTS and AI technology used. I apologize for any typographical errors that were not detected and corrected.) Date: 07/30/2024; Time: 9:27 AM

## 2024-07-30 NOTE — Patient Instructions (Signed)

## 2024-07-31 ENCOUNTER — Encounter: Payer: Self-pay | Admitting: Gastroenterology

## 2024-08-01 ENCOUNTER — Telehealth: Payer: Self-pay

## 2024-08-01 NOTE — Progress Notes (Signed)
 Called w/ no contact made. Lvm w/ instructions to call and schedule as well as sent my chart result letter w/ same instructions .

## 2024-08-01 NOTE — Telephone Encounter (Signed)
 Copied from CRM [id number]. Topic: Clinical - Lab/Test Results >> Aug 01, 2024  9:24 AM Wess RAMAN wrote: Reason for CRM: Patient missed call from Corinna Jesusa SAUNDERS, CMA to discuss her lab results.   Callback #: [phone]

## 2024-08-02 LAB — COMPLIANCE DRUG ANALYSIS, UR

## 2024-08-05 ENCOUNTER — Telehealth: Payer: Self-pay

## 2024-08-05 ENCOUNTER — Ambulatory Visit: Admitting: Family Medicine

## 2024-08-05 ENCOUNTER — Encounter: Payer: Self-pay | Admitting: Family Medicine

## 2024-08-05 VITALS — BP 122/79 | HR 90 | Temp 97.9°F | Ht 63.0 in | Wt 244.8 lb

## 2024-08-05 DIAGNOSIS — E785 Hyperlipidemia, unspecified: Secondary | ICD-10-CM | POA: Diagnosis not present

## 2024-08-05 DIAGNOSIS — R7989 Other specified abnormal findings of blood chemistry: Secondary | ICD-10-CM

## 2024-08-05 DIAGNOSIS — E118 Type 2 diabetes mellitus with unspecified complications: Secondary | ICD-10-CM

## 2024-08-05 DIAGNOSIS — Z1231 Encounter for screening mammogram for malignant neoplasm of breast: Secondary | ICD-10-CM

## 2024-08-05 DIAGNOSIS — E1169 Type 2 diabetes mellitus with other specified complication: Secondary | ICD-10-CM

## 2024-08-05 MED ORDER — VITAMIN D3 25 MCG (1000 UT) PO CAPS: 1000.0000 [IU] | ORAL_CAPSULE | Freq: Every day | ORAL | 1 refills | Status: DC

## 2024-08-05 MED ORDER — ROSUVASTATIN CALCIUM 40 MG PO TABS: 40.0000 mg | ORAL_TABLET | Freq: Every day | ORAL | 1 refills | Status: AC

## 2024-08-05 NOTE — Assessment & Plan Note (Signed)
 Start Vitamin D  600-800 units daily

## 2024-08-05 NOTE — Progress Notes (Signed)
 Subjective:  HPI: Lindsay Harris is a 48 y.o. female presenting on 08/05/2024 for Medical Management of Chronic Issues (Lab f/u /Both feet swollen up to the ankle. Lft foot painful in the arch of arch of the foot feels like it is going to split. )   HPI Patient is in today for labs follow up. PMH includes Followed by Cardiology, Pulmonology, Urology, Gastroenterology, Podiatry, OBGYN, and Ortho Surgery. PMH includes palpitations and tachycardia, COPD, asthma, smoker, urinary incontinence, low back pain from herniated discs, DDD, bursitis of her right hip with referral to pain clinic authorized, seizure, IBS-C, anxiety and depression, and arthritis. Sees OBGYN for ovarian cysts, endometriosis, and PCOS.   She reports she only eats one meal a day, dinner, and consumes low carbs. Does not eat breads, pastas, but does have a baked potato twice weekly and drinks sodas and juice daily.  DM: A1c 6.8%, FBG at home 150s, denies polyuria, chest pain, paresthesias, or chest pain. Endorses polydypsia.  HLD: Rosuvastatin  20mg  daily, compliant, poor diet, exercise is limited by pulm but she is doing chair exercises. Denies chest pain.   Lipid Panel     Component Value Date/Time   CHOL 222 (H) 07/24/2024 1444   CHOL 317 (H) 09/04/2023 1047   TRIG 346 (H) 07/24/2024 1444   HDL 34 (L) 07/24/2024 1444   HDL 50 09/04/2023 1047   CHOLHDL 6.5 (H) 07/24/2024 1444   LDLCALC 139 (H) 07/24/2024 1444   LABVLDL 38 09/04/2023 1047   Vitamin D : 26, not taking supplementation  Review of Systems  All other systems reviewed and are negative.   Relevant past medical history reviewed and updated as indicated.   Past Medical History:  Diagnosis Date   Asthma 03/1987   COPD (chronic obstructive pulmonary disease) (HCC)    Dyspnea    GERD (gastroesophageal reflux disease)    Headache    migraines   Thyroid disease [id number]     Past Surgical History:  Procedure Laterality Date   COLONOSCOPY WITH PROPOFOL   N/A 08/09/2023   Procedure: COLONOSCOPY WITH BIOPSY;  Surgeon: Unk Corinn Skiff, MD;  Location: [facility];  Service: Endoscopy;  Laterality: N/A;   POLYPECTOMY N/A 08/09/2023   Procedure: POLYPECTOMY;  Surgeon: Unk Corinn Skiff, MD;  Location: [facility];  Service: Endoscopy;  Laterality: N/A;    Allergies and medications reviewed and updated.   Current Outpatient Medications:    albuterol  (PROVENTIL ) (2.5 MG/3ML) 0.083% nebulizer solution, Take 2.5 mg by nebulization every 4 (four) hours as needed., Disp: , Rfl:    albuterol  (VENTOLIN  HFA) 108 (90 Base) MCG/ACT inhaler, Inhale 2 puffs into the lungs every 6 (six) hours as needed., Disp: 3 each, Rfl: 3   cyclobenzaprine (FLEXERIL) 5 MG tablet, Take 5 mg by mouth 3 (three) times daily as needed for muscle spasms., Disp: , Rfl:    DULoxetine  (CYMBALTA ) 60 MG capsule, Take 60 mg by mouth 2 (two) times daily., Disp: , Rfl:    Dupilumab  (DUPIXENT ) 300 MG/2ML SOAJ, Inject 300 mg into the skin every 14 (fourteen) days., Disp: 12 mL, Rfl: 1   Elagolix Sodium  (ORILISSA ) 200 MG TABS, Take 1 tablet (200 mg total) by mouth 2 (two) times daily., Disp: 60 tablet, Rfl: 6   EPINEPHrine 0.3 mg/0.3 mL IJ SOAJ injection, Inject 0.3 mg into the muscle as needed for anaphylaxis., Disp: , Rfl:    eszopiclone (LUNESTA) 1 MG TABS tablet, Take 1 mg by mouth at bedtime as needed., Disp: , Rfl:  furosemide (LASIX) 20 MG tablet, Take 20 mg by mouth daily as needed., Disp: , Rfl:    gabapentin  (NEURONTIN ) 100 MG capsule, Take 400 mg by mouth 2 (two) times daily. Take 400mg  in the morning and 400mg  in the afternoon., Disp: , Rfl:    gabapentin  (NEURONTIN ) 600 MG tablet, Take 600 mg by mouth at bedtime., Disp: , Rfl:    lactulose  (CHRONULAC ) 10 GM/15ML solution, Take 30 mLs (20 g total) by mouth daily., Disp: 900 mL, Rfl: 3   linaclotide  (LINZESS ) 290 MCG CAPS capsule, TAKE 1 CAPSULE BY MOUTH DAILY BEFORE BREAKFAST., Disp: 90 capsule, Rfl: 0    montelukast  (SINGULAIR ) 10 MG tablet, Take 10 mg by mouth at bedtime., Disp: , Rfl:    ondansetron  (ZOFRAN ) 4 mg TABS tablet, Take by mouth every 8 (eight) hours as needed., Disp: , Rfl:    pantoprazole  (PROTONIX ) 40 MG tablet, Take 1 tablet (40 mg total) by mouth 2 (two) times daily before a meal., Disp: 60 tablet, Rfl: 0   propranolol  (INDERAL ) 20 MG tablet, Take 1 tablet (20 mg total) by mouth 3 (three) times daily as needed., Disp: 90 tablet, Rfl: 3   propranolol  ER (INDERAL  LA) 120 MG 24 hr capsule, Take 120 mg by mouth daily., Disp: , Rfl:    Simethicone 180 MG CAPS, Take 1 capsule by mouth 3 (three) times daily as needed., Disp: , Rfl:    Spacer/Aero-Holding Chambers (AEROCHAMBER MV) inhaler, Use as instructed, Disp: 1 each, Rfl: 0   SPIRIVA  HANDIHALER 18 MCG inhalation capsule, Place 1 capsule (18 mcg total) into inhaler and inhale daily., Disp: 90 capsule, Rfl: 3   SUMAtriptan (IMITREX) 25 MG tablet, Take 25 mg by mouth every 2 (two) hours as needed., Disp: , Rfl:    SYMBICORT  160-4.5 MCG/ACT inhaler, INHALE 2 PUFFS INTO THE LUNGS IN THE MORNING AND AT BEDTIME., Disp: 30.6 each, Rfl: 4   Vibegron  (GEMTESA ) 75 MG TABS, Take 1 tablet (75 mg total) by mouth daily., Disp: 42 tablet, Rfl:    rosuvastatin  (CRESTOR ) 40 MG tablet, Take 1 tablet (40 mg total) by mouth daily., Disp: 90 tablet, Rfl: 1  Allergies  Allergen Reactions   Amoxicillin Anaphylaxis and Hives    Objective:   BP 122/79   Pulse 90   Temp 97.9 F (36.6 C)   Ht 5' 3 (1.6 m)   Wt 244 lb 12.8 oz (111 kg)   LMP 08/19/2023 (Approximate)   SpO2 96%   BMI 43.36 kg/m      08/05/2024   11:54 AM 07/30/2024    8:16 AM 07/24/2024    2:13 PM  Vitals with BMI  Height 5' 3 5' 3   Weight 244 lbs 13 oz 258 lbs 10 oz   BMI 43.38 45.82   Systolic 122 133 884  Diastolic 79 66 65  Pulse 90 88      Physical Exam Vitals and nursing note reviewed.  Constitutional:      Appearance: Normal appearance. She is obese.  HENT:      Head: Normocephalic and atraumatic.  Cardiovascular:     Rate and Rhythm: Normal rate and regular rhythm.     Pulses: Normal pulses.     Heart sounds: Normal heart sounds.  Pulmonary:     Effort: Pulmonary effort is normal.     Breath sounds: Wheezing and rhonchi present.  Skin:    General: Skin is warm and dry.  Neurological:     General: No focal deficit present.  Mental Status: She is alert and oriented to person, place, and time. Mental status is at baseline.  Psychiatric:        Mood and Affect: Mood normal.        Behavior: Behavior normal.        Thought Content: Thought content normal.        Judgment: Judgment normal.     Assessment & Plan:  Controlled type 2 diabetes mellitus with complication, without long-term current use of insulin  (HCC) Assessment & Plan: A1c and uACR UTD. Foot exam UTD. Vaccines UTD. Retinal eye exam UTD. Recommend heart healthy diet such as Mediterranean diet with whole grains, fruits, vegetable, fish, lean meats, nuts, and olive oil. Limit salt. Encouraged moderate walking, 3-5 times/week for 30-50 minutes each session. Aim for at least 150 minutes.week. Goal should be pace of 3 miles/hours, or walking 1.5 miles in 30 minutes. Seek medical care for urinary frequency, extreme thirst, vision changes, lightheadedness, dizziness.  Will recheck in 3 months. Discussed benefits of GLP and will consider  Orders: -     Microalbumin / creatinine urine ratio  Encounter for screening mammogram for malignant neoplasm of breast -     Digital Screening Mammogram, Left and Right; Future  Hyperlipidemia associated with type 2 diabetes mellitus (HCC) Assessment & Plan: Increase Rosuvastatin  to 40mg  daily. I recommend consuming a heart healthy diet such as Mediterranean diet or DASH diet with whole grains, fruits, vegetable, fish, lean meats, nuts, and olive oil. Limit sweets and processed foods. I also encourage moderate intensity exercise 150 minutes weekly.  This is 3-5 times weekly for 30-50 minutes each session. Goal should be pace of 3 miles/hours, or walking 1.5 miles in 30 minutes. The 10-year ASCVD risk score (Arnett DK, et al., 2019) is: 12%    Low serum vitamin D  Assessment & Plan: Start Vitamin D  600-800 units daily   Other orders -     Rosuvastatin  Calcium ; Take 1 tablet (40 mg total) by mouth daily.  Dispense: 90 tablet; Refill: 1     Follow up plan: Return if symptoms worsen or fail to improve, for as scheduled in 2 months.  Jeoffrey GORMAN Barrio, FNP

## 2024-08-05 NOTE — Assessment & Plan Note (Signed)
 Increase Rosuvastatin  to 40mg  daily. I recommend consuming a heart healthy diet such as Mediterranean diet or DASH diet with whole grains, fruits, vegetable, fish, lean meats, nuts, and olive oil. Limit sweets and processed foods. I also encourage moderate intensity exercise 150 minutes weekly. This is 3-5 times weekly for 30-50 minutes each session. Goal should be pace of 3 miles/hours, or walking 1.5 miles in 30 minutes. The 10-year ASCVD risk score (Arnett DK, et al., 2019) is: 12%

## 2024-08-05 NOTE — Assessment & Plan Note (Signed)
 A1c and uACR UTD. Foot exam UTD. Vaccines UTD. Retinal eye exam UTD. Recommend heart healthy diet such as Mediterranean diet with whole grains, fruits, vegetable, fish, lean meats, nuts, and olive oil. Limit salt. Encouraged moderate walking, 3-5 times/week for 30-50 minutes each session. Aim for at least 150 minutes.week. Goal should be pace of 3 miles/hours, or walking 1.5 miles in 30 minutes. Seek medical care for urinary frequency, extreme thirst, vision changes, lightheadedness, dizziness.  Will recheck in 3 months. Discussed benefits of GLP and will consider

## 2024-08-05 NOTE — Telephone Encounter (Signed)
 Copied from CRM #[id number]. Topic: Clinical - Prescription Issue >> Aug 05, 2024  1:48 PM Emylou G wrote: Reason for CRM: Patient called.SABRA adv rcvd call from pharmacy.. Vitamin D  order isn't covered.. do we have alternative.SABRA

## 2024-08-06 ENCOUNTER — Other Ambulatory Visit

## 2024-08-06 LAB — COMP. METABOLIC PANEL (12)
AST: 39 IU/L (ref 0–40)
Albumin: 3.8 g/dL — ABNORMAL LOW (ref 3.9–4.9)
Alkaline Phosphatase: 114 IU/L (ref 44–121)
BUN/Creatinine Ratio: 6 — ABNORMAL LOW (ref 9–23)
BUN: 5 mg/dL — ABNORMAL LOW (ref 6–24)
Bilirubin Total: <0.2 mg/dL (ref 0.0–1.2)
Calcium: 9.1 mg/dL (ref 8.7–10.2)
Chloride: 101 mmol/L (ref 96–106)
Creatinine, Ser: 0.84 mg/dL (ref 0.57–1.00)
Globulin, Total: 2.9 g/dL (ref 1.5–4.5)
Glucose: 127 mg/dL — ABNORMAL HIGH (ref 70–99)
Potassium: 4.7 mmol/L (ref 3.5–5.2)
Sodium: 140 mmol/L (ref 134–144)
Total Protein: 6.7 g/dL (ref 6.0–8.5)
eGFR: 86 mL/min/1.73 (ref >59)

## 2024-08-06 LAB — 25-HYDROXY VITAMIN D LCMS D2+D3
25-Hydroxy, Vitamin D-2: <1 ng/mL
25-Hydroxy, Vitamin D-3: 20 ng/mL
25-Hydroxy, Vitamin D: 20 ng/mL — AB

## 2024-08-06 LAB — VITAMIN B12: Vitamin B-12: 154 pg/mL — ABNORMAL LOW (ref 232–1245)

## 2024-08-06 LAB — MICROALBUMIN / CREATININE URINE RATIO
Creatinine, Urine: 116 mg/dL (ref 20–275)
Microalb, Ur: <0.2 mg/dL

## 2024-08-06 LAB — C-REACTIVE PROTEIN: CRP: 12 mg/L — ABNORMAL HIGH (ref 0–10)

## 2024-08-06 LAB — SEDIMENTATION RATE: Sed Rate: 44 mm/h — ABNORMAL HIGH (ref 0–32)

## 2024-08-06 LAB — MAGNESIUM: Magnesium: 2.1 mg/dL (ref 1.6–2.3)

## 2024-08-07 ENCOUNTER — Other Ambulatory Visit: Payer: Self-pay | Admitting: Family Medicine

## 2024-08-12 ENCOUNTER — Ambulatory Visit
Admission: RE | Admit: 2024-08-12 | Discharge: 2024-08-12 | Disposition: A | Discharge status: Home / Self Care | Source: Ambulatory Visit | Attending: Pain Medicine | Admitting: Pain Medicine

## 2024-08-12 ENCOUNTER — Ambulatory Visit
Admission: RE | Admit: 2024-08-12 | Discharge: 2024-08-12 | Disposition: A | Discharge status: Home / Self Care | Attending: Pain Medicine | Admitting: Pain Medicine

## 2024-08-12 DIAGNOSIS — M25511 Pain in right shoulder: Secondary | ICD-10-CM | POA: Diagnosis present

## 2024-08-12 DIAGNOSIS — G8929 Other chronic pain: Secondary | ICD-10-CM | POA: Diagnosis present

## 2024-08-12 DIAGNOSIS — M25551 Pain in right hip: Secondary | ICD-10-CM | POA: Insufficient documentation

## 2024-08-12 DIAGNOSIS — M25512 Pain in left shoulder: Secondary | ICD-10-CM | POA: Insufficient documentation

## 2024-08-12 DIAGNOSIS — M545 Low back pain, unspecified: Secondary | ICD-10-CM | POA: Insufficient documentation

## 2024-08-12 DIAGNOSIS — M542 Cervicalgia: Secondary | ICD-10-CM | POA: Insufficient documentation

## 2024-08-12 DIAGNOSIS — M25552 Pain in left hip: Secondary | ICD-10-CM | POA: Insufficient documentation

## 2024-08-12 DIAGNOSIS — M79605 Pain in left leg: Secondary | ICD-10-CM

## 2024-08-12 DIAGNOSIS — M79601 Pain in right arm: Secondary | ICD-10-CM | POA: Insufficient documentation

## 2024-08-12 DIAGNOSIS — M79604 Pain in right leg: Secondary | ICD-10-CM | POA: Insufficient documentation

## 2024-08-12 DIAGNOSIS — M5412 Radiculopathy, cervical region: Secondary | ICD-10-CM | POA: Insufficient documentation

## 2024-08-19 ENCOUNTER — Other Ambulatory Visit

## 2024-08-19 ENCOUNTER — Other Ambulatory Visit: Payer: Self-pay | Admitting: Family Medicine

## 2024-08-20 ENCOUNTER — Ambulatory Visit: Admitting: Pain Medicine

## 2024-08-20 ENCOUNTER — Encounter: Payer: Self-pay | Admitting: Pain Medicine

## 2024-08-20 ENCOUNTER — Other Ambulatory Visit
Admission: RE | Admit: 2024-08-20 | Discharge: 2024-08-20 | Disposition: A | Discharge status: Home / Self Care | Source: Ambulatory Visit | Attending: Pain Medicine | Admitting: Pain Medicine

## 2024-08-20 ENCOUNTER — Encounter: Payer: Self-pay | Admitting: Family Medicine

## 2024-08-20 ENCOUNTER — Ambulatory Visit: Admitting: Family Medicine

## 2024-08-20 VITALS — BP 125/83 | HR 88 | Ht 63.0 in | Wt 252.2 lb

## 2024-08-20 VITALS — BP 154/59 | HR 112 | Temp 97.3°F | Resp 20 | Ht 63.0 in | Wt 244.0 lb

## 2024-08-20 DIAGNOSIS — E538 Deficiency of other specified B group vitamins: Secondary | ICD-10-CM | POA: Insufficient documentation

## 2024-08-20 DIAGNOSIS — Z9981 Dependence on supplemental oxygen: Secondary | ICD-10-CM | POA: Diagnosis not present

## 2024-08-20 DIAGNOSIS — M25511 Pain in right shoulder: Secondary | ICD-10-CM

## 2024-08-20 DIAGNOSIS — R700 Elevated erythrocyte sedimentation rate: Secondary | ICD-10-CM | POA: Insufficient documentation

## 2024-08-20 DIAGNOSIS — G8929 Other chronic pain: Secondary | ICD-10-CM | POA: Insufficient documentation

## 2024-08-20 DIAGNOSIS — M79641 Pain in right hand: Secondary | ICD-10-CM | POA: Insufficient documentation

## 2024-08-20 DIAGNOSIS — G894 Chronic pain syndrome: Secondary | ICD-10-CM

## 2024-08-20 DIAGNOSIS — M542 Cervicalgia: Secondary | ICD-10-CM | POA: Insufficient documentation

## 2024-08-20 DIAGNOSIS — R110 Nausea: Secondary | ICD-10-CM | POA: Diagnosis not present

## 2024-08-20 DIAGNOSIS — M25552 Pain in left hip: Secondary | ICD-10-CM | POA: Insufficient documentation

## 2024-08-20 DIAGNOSIS — E559 Vitamin D deficiency, unspecified: Secondary | ICD-10-CM | POA: Insufficient documentation

## 2024-08-20 DIAGNOSIS — R7309 Other abnormal glucose: Secondary | ICD-10-CM | POA: Insufficient documentation

## 2024-08-20 DIAGNOSIS — M5090 Cervical disc disorder, unspecified, unspecified cervical region: Secondary | ICD-10-CM

## 2024-08-20 DIAGNOSIS — Z79899 Other long term (current) drug therapy: Secondary | ICD-10-CM | POA: Diagnosis not present

## 2024-08-20 DIAGNOSIS — M25512 Pain in left shoulder: Secondary | ICD-10-CM | POA: Insufficient documentation

## 2024-08-20 DIAGNOSIS — M25551 Pain in right hip: Secondary | ICD-10-CM | POA: Diagnosis not present

## 2024-08-20 DIAGNOSIS — M79605 Pain in left leg: Secondary | ICD-10-CM | POA: Insufficient documentation

## 2024-08-20 DIAGNOSIS — R7982 Elevated C-reactive protein (CRP): Secondary | ICD-10-CM | POA: Insufficient documentation

## 2024-08-20 DIAGNOSIS — J449 Chronic obstructive pulmonary disease, unspecified: Secondary | ICD-10-CM | POA: Insufficient documentation

## 2024-08-20 DIAGNOSIS — M5116 Intervertebral disc disorders with radiculopathy, lumbar region: Secondary | ICD-10-CM | POA: Insufficient documentation

## 2024-08-20 DIAGNOSIS — M79601 Pain in right arm: Secondary | ICD-10-CM | POA: Diagnosis not present

## 2024-08-20 DIAGNOSIS — M51379 Other intervertebral disc degeneration, lumbosacral region without mention of lumbar back pain or lower extremity pain: Secondary | ICD-10-CM | POA: Insufficient documentation

## 2024-08-20 DIAGNOSIS — M545 Low back pain, unspecified: Secondary | ICD-10-CM | POA: Insufficient documentation

## 2024-08-20 DIAGNOSIS — F17210 Nicotine dependence, cigarettes, uncomplicated: Secondary | ICD-10-CM | POA: Insufficient documentation

## 2024-08-20 DIAGNOSIS — E119 Type 2 diabetes mellitus without complications: Secondary | ICD-10-CM | POA: Insufficient documentation

## 2024-08-20 DIAGNOSIS — M79604 Pain in right leg: Secondary | ICD-10-CM

## 2024-08-20 DIAGNOSIS — R937 Abnormal findings on diagnostic imaging of other parts of musculoskeletal system: Secondary | ICD-10-CM

## 2024-08-20 LAB — URIC ACID: Uric Acid, Serum: 5.5 mg/dL (ref 2.5–7.1)

## 2024-08-20 MED ORDER — ERGOCALCIFEROL 1.25 MG (50000 UT) PO CAPS
50000.0000 [IU] | ORAL_CAPSULE | ORAL | 0 refills | Status: AC
Start: 2024-08-21 — End: 2024-10-02

## 2024-08-20 MED ORDER — VITAMIN B-12 5000 MCG SL SUBL: 5000.0000 ug | SUBLINGUAL_TABLET | Freq: Every day | SUBLINGUAL | 0 refills | Status: AC

## 2024-08-20 MED ORDER — VITAMIN D3 125 MCG (5000 UT) PO CAPS: 5000.0000 [IU] | ORAL_CAPSULE | Freq: Every day | ORAL | 0 refills | Status: AC

## 2024-08-20 MED ORDER — ONDANSETRON 4 MG PO TBDP: 4.0000 mg | ORAL_TABLET | Freq: Three times a day (TID) | ORAL | 0 refills | Status: AC | PRN

## 2024-08-20 NOTE — Assessment & Plan Note (Signed)
 Likely viral. Start Zofran  4mg  q8h PRN. Most recent EKG NSR with normal QTc. Encouraged to push electrolyte fluids. CMP today. Return to office if symptoms persist or worsen.

## 2024-08-20 NOTE — Progress Notes (Signed)
 Subjective:  HPI: Lindsay Harris is a 48 y.o. female presenting on 08/20/2024 for Acute Visit ( Having trouble keeping liquids down and feels dehydrated constant nausea since Monday )   HPI Patient is in today for 3 days GI upset with nausea. Similar symptoms in her daughter. Denies vomiting, diarrhea. Has not been able to tolerate PO intake.  Denies fever, chills, body aches, abdominal pain, melena, hematochezia. Has tried nothing.    Review of Systems  All other systems reviewed and are negative.   Relevant past medical history reviewed and updated as indicated.   Past Medical History:  Diagnosis Date   Asthma 03/1987   COPD (chronic obstructive pulmonary disease) (HCC)    Dyspnea    GERD (gastroesophageal reflux disease)    Headache    migraines   Thyroid disease [id number]     Past Surgical History:  Procedure Laterality Date   COLONOSCOPY WITH PROPOFOL  N/A 08/09/2023   Procedure: COLONOSCOPY WITH BIOPSY;  Surgeon: Unk Corinn Skiff, MD;  Location: [facility];  Service: Endoscopy;  Laterality: N/A;   POLYPECTOMY N/A 08/09/2023   Procedure: POLYPECTOMY;  Surgeon: Unk Corinn Skiff, MD;  Location: [facility];  Service: Endoscopy;  Laterality: N/A;    Allergies and medications reviewed and updated.   Current Outpatient Medications:    albuterol  (PROVENTIL ) (2.5 MG/3ML) 0.083% nebulizer solution, Take 2.5 mg by nebulization every 4 (four) hours as needed., Disp: , Rfl:    albuterol  (VENTOLIN  HFA) 108 (90 Base) MCG/ACT inhaler, Inhale 2 puffs into the lungs every 6 (six) hours as needed., Disp: 3 each, Rfl: 3   Cholecalciferol (VITAMIN D3) 125 MCG (5000 UT) CAPS, Take 1 capsule (5,000 Units total) by mouth daily with breakfast. Take along with calcium  and magnesium ., Disp: 90 capsule, Rfl: 0   Cyanocobalamin (VITAMIN B-12) 5000 MCG SUBL, Place 1 tablet (5,000 mcg total) under the tongue daily., Disp: 30 tablet, Rfl: 0   cyclobenzaprine (FLEXERIL) 5 MG  tablet, Take 5 mg by mouth 3 (three) times daily as needed for muscle spasms., Disp: , Rfl:    DULoxetine  (CYMBALTA ) 60 MG capsule, Take 60 mg by mouth 2 (two) times daily., Disp: , Rfl:    Dupilumab  (DUPIXENT ) 300 MG/2ML SOAJ, Inject 300 mg into the skin every 14 (fourteen) days., Disp: 12 mL, Rfl: 1   Elagolix Sodium  (ORILISSA ) 200 MG TABS, Take 1 tablet (200 mg total) by mouth 2 (two) times daily., Disp: 60 tablet, Rfl: 6   EPINEPHrine 0.3 mg/0.3 mL IJ SOAJ injection, Inject 0.3 mg into the muscle as needed for anaphylaxis., Disp: , Rfl:    [START ON 08/21/2024] ergocalciferol  (VITAMIN D2) 1.25 MG (50000 UT) capsule, Take 1 capsule (50,000 Units total) by mouth 2 (two) times a week. X 6 weeks., Disp: 12 capsule, Rfl: 0   eszopiclone (LUNESTA) 1 MG TABS tablet, Take 1 mg by mouth at bedtime as needed., Disp: , Rfl:    furosemide (LASIX) 20 MG tablet, Take 20 mg by mouth daily as needed., Disp: , Rfl:    gabapentin  (NEURONTIN ) 100 MG capsule, Take 400 mg by mouth 2 (two) times daily. Take 400mg  in the morning and 400mg  in the afternoon., Disp: , Rfl:    gabapentin  (NEURONTIN ) 600 MG tablet, Take 600 mg by mouth at bedtime., Disp: , Rfl:    lactulose  (CHRONULAC ) 10 GM/15ML solution, Take 30 mLs (20 g total) by mouth daily., Disp: 900 mL, Rfl: 3   linaclotide  (LINZESS ) 290 MCG CAPS capsule, TAKE  1 CAPSULE BY MOUTH DAILY BEFORE BREAKFAST., Disp: 90 capsule, Rfl: 0   montelukast  (SINGULAIR ) 10 MG tablet, Take 10 mg by mouth at bedtime., Disp: , Rfl:    ondansetron  (ZOFRAN -ODT) 4 MG disintegrating tablet, Take 1 tablet (4 mg total) by mouth every 8 (eight) hours as needed for nausea., Disp: 30 tablet, Rfl: 0   pantoprazole  (PROTONIX ) 40 MG tablet, Take 1 tablet (40 mg total) by mouth 2 (two) times daily before a meal., Disp: 60 tablet, Rfl: 0   propranolol  (INDERAL ) 20 MG tablet, Take 1 tablet (20 mg total) by mouth 3 (three) times daily as needed., Disp: 90 tablet, Rfl: 3   propranolol  ER (INDERAL  LA) 120  MG 24 hr capsule, Take 120 mg by mouth daily., Disp: , Rfl:    rosuvastatin  (CRESTOR ) 40 MG tablet, Take 1 tablet (40 mg total) by mouth daily., Disp: 90 tablet, Rfl: 1   Simethicone 180 MG CAPS, Take 1 capsule by mouth 3 (three) times daily as needed., Disp: , Rfl:    Spacer/Aero-Holding Chambers (AEROCHAMBER MV) inhaler, Use as instructed, Disp: 1 each, Rfl: 0   SPIRIVA  HANDIHALER 18 MCG inhalation capsule, Place 1 capsule (18 mcg total) into inhaler and inhale daily., Disp: 90 capsule, Rfl: 3   SUMAtriptan (IMITREX) 25 MG tablet, Take 25 mg by mouth every 2 (two) hours as needed., Disp: , Rfl:    SYMBICORT  160-4.5 MCG/ACT inhaler, INHALE 2 PUFFS INTO THE LUNGS IN THE MORNING AND AT BEDTIME., Disp: 30.6 each, Rfl: 4   Vibegron  (GEMTESA ) 75 MG TABS, Take 1 tablet (75 mg total) by mouth daily., Disp: 42 tablet, Rfl:   Allergies  Allergen Reactions   Amoxicillin Anaphylaxis and Hives    Objective:   BP 125/83   Pulse 88   Ht 5' 3 (1.6 m)   Wt 252 lb 3.2 oz (114.4 kg)   LMP 08/19/2023 (Approximate)   SpO2 95%   BMI 44.68 kg/m      08/20/2024    3:56 PM 08/20/2024   10:18 AM 08/05/2024   11:54 AM  Vitals with BMI  Height 5' 3 5' 3 5' 3  Weight 252 lbs 3 oz 244 lbs 244 lbs 13 oz  BMI 44.69 43.23 43.38  Systolic 125 154 877  Diastolic 83 59 79  Pulse 88 112 90     Physical Exam Vitals and nursing note reviewed.  Constitutional:      Appearance: Normal appearance. She is normal weight.  HENT:     Head: Normocephalic and atraumatic.  Abdominal:     General: Bowel sounds are normal. There is no distension.     Palpations: Abdomen is soft.  Skin:    General: Skin is warm and dry.  Neurological:     General: No focal deficit present.     Mental Status: She is alert and oriented to person, place, and time. Mental status is at baseline.  Psychiatric:        Mood and Affect: Mood normal.        Behavior: Behavior normal.        Thought Content: Thought content normal.         Judgment: Judgment normal.     Assessment & Plan:  Nausea Assessment & Plan: Likely viral. Start Zofran  4mg  q8h PRN. Most recent EKG NSR with normal QTc. Encouraged to push electrolyte fluids. CMP today. Return to office if symptoms persist or worsen.   Orders: -     Comprehensive metabolic panel with  GFR  Other orders -     Ondansetron ; Take 1 tablet (4 mg total) by mouth every 8 (eight) hours as needed for nausea.  Dispense: 30 tablet; Refill: 0     Follow up plan: Return if symptoms worsen or fail to improve.  Jeoffrey GORMAN Barrio, FNP

## 2024-08-20 NOTE — Progress Notes (Signed)
 PROVIDER NOTE: Interpretation of information contained herein should be left to medically-trained personnel. Specific patient instructions are provided elsewhere under Patient Instructions section of medical record. This document was created in part using AI and STT-dictation technology, any transcriptional errors that may result from this process are unintentional.  Patient: Lindsay Harris  Service: E/M   PCP: Kayla Jeoffrey RAMAN, FNP  DOB: 1976/12/09  DOS: 08/20/2024  Provider: Eric DELENA Como, MD  MRN: [id number]  Delivery: Face-to-face  Specialty: Interventional Pain Management  Type: Established Patient  Setting: Ambulatory outpatient facility  Specialty designation: 09  Referring Prov.: Kayla Jeoffrey RAMAN, FNP  Location: Outpatient office facility       Primary Reason(s) for Visit: Encounter for evaluation before starting new chronic pain management plan of care (Level of risk: moderate) CC: Back Pain and Neck Pain (Shoulders bilateral,down arm to hands bilateral, effects all fingers, right is the worse, unable to hold things/)  HPI  Lindsay Harris is a 48 y.o. year old, female patient, who comes today for a follow-up evaluation to review the test results and decide on a treatment plan. She has Tobacco dependence; Dyspareunia in female; Adenomatous polyp of sigmoid colon; Cervical neck pain with evidence of disc disease; Generalized anxiety disorder; Hyperthyroidism; COPD (chronic obstructive pulmonary disease) (HCC); Disc degeneration, lumbar; Endometriosis; GERD (gastroesophageal reflux disease); High cholesterol; Hyperglycemia; Migraines; Chronic pain syndrome; Obesity, Class III, BMI 40-49.9 (morbid obesity); Palpitations; OSA (obstructive sleep apnea); Encounter to establish care with new provider; Chronic low back pain (Midline) (1ry area of Pain) w/ sciatica (Right); IBS (irritable bowel syndrome); OAB (overactive bladder); Abnormal MRI, lumbar spine (11/12/2023) ([facility]); Chronic lower extremity  pain (Right); Cervicalgia; Chronic neck pain (Bilateral); Chronic shoulder pain (Bilateral) (R>L); Chronic lower extremity pain (Right); Cervical radiculitis (Right); Chronic low back pain (Bilateral) w/o sciatica; Low back pain of over 3 months duration; Low back pain radiating to both legs; Multifactorial low back pain; 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 Her primarily concern today is the Back Pain and Neck Pain (Shoulders bilateral,down arm to hands bilateral, effects all fingers, right is the worse, unable to hold things/)  Pain Assessment: Location: Lower, Right, Left Back Radiating: hips bilateral , down back of legs bilateral to bottom of feet bilateral effects all toes (numb) Onset: More than a month ago Duration: Chronic pain Quality: Aching, Constant, Tender, Shooting, Nagging, Tiring Severity: 8 /10 (subjective, self-reported pain score)  Effect on ADL: limits adl's Timing:   Modifying factors: meds, rest BP: (!) 154/59  HR: (!) 112  Ms. Falotico comes in today for a follow-up visit after her initial evaluation on 07/30/2024. Today we went over the results of her tests. These were explained in Layman's terms. During today's appointment we went over my diagnostic impression, as well as the proposed treatment plan.  Review of initial evaluation (07/30/2024): Lindsay Harris is a 48 year old female who presents with severe pain preventing completion of physical therapy. She was referred by her COPD doctor for pain  management evaluation.   She experiences severe pain due to a bulging disc at L4-L5 or L5-S1  and a pinched sciatic nerve, which began after a fall at the end of last year. This pain prevents her from completing physical therapy. Injections in her hip and lower back provide temporary relief for about a week but do not offer sustained improvement. Her pain management is further complicated by respiratory issues related to chronic obstructive pulmonary disease (COPD).     The patient primary area of pain is that of the neck (posterior aspect).  This neck pain appears to refer pain to both shoulder regions with the right being worse than the left.  The patient also indicates that the pain goes down the right upper extremity affecting the middle finger, ring finger, and pinky finger, and what appears to be the distribution of C7/C8.  She states that when the pain is really bad it tends to be referred towards the mid upper back.  The patient denies any cervical spine surgery or interventional procedures in the cervical region.   The patient has secondary area of pain is that of the lower back (Bilateral) with pain being referred towards the buttocks (Bilateral) and down the posterior aspect of the legs to the knee (Bilateral).  The patient denies any prior back surgeries or interventional procedures in the lower back region.  She does admit to having had some injections in the hip area by [facility] orthopedic department.   Medications used for analgesia include gabapentin  and Flexeril.   Medical problems are significant for COPD and been a current smoker.  She is on constant oxygen at 3 L/min.  She is using a walker to ambulate.  Review of diagnostic test ordered on 07/30/2024:  Diagnostic lab work: Lab work demonstrated a vitamin D  deficiency of 20 ng/mL (normal 30-100), and elevated CRP of 12 mg/L (normal 0-10) and elevated set rate of 44 mm/h (normal 0-32) a vitamin B 12 deficiency of 154 pg/mL  (normal [phone]) and a comprehensive metabolic panel demonstrated elevated levels of glucose at 127 mg/dL (normal 29-00) with a decreased BUN of 5 mg/dL (normal 3-75) a decreased BUN/creatinine ratio of 6 (normal 9-23) and a decrease albumin level of 3.8 g/dL (normal 6.0-5.0).  Magnesium  levels were within normal limits.  UDS was positive for carboxy-THC. Diagnostic imaging: Diagnostic x-rays of both hips, both shoulders, the lumbar spine, and the cervical spine were ordered on 08/12/2024.  All of these x-rays were completed at that time, but as of today (08/20/2024) at 8 AM, they official reports had not been posted.  Discussed the use of AI scribe software for clinical note transcription with the patient, who gave verbal consent to proceed.  History of Present Illness   Guneet Delpino Filion is a 48 year old female who presents for follow-up evaluation of neck and lower back pain after lab work and imaging studies.  Severe neck pain is localized to the posterior aspect, with radiation to both shoulders, more pronounced on the right. Pain extends down the right upper extremity, affecting the middle, ring, and pinky fingers in a C7/C8 dermatomotor distribution. Neck pain also radiates to the upper back.  Lower back pain is bilateral, with radiation to both buttocks and down the posterior aspect of both legs to the knees, indicating a referred pain pattern.  She has COPD, managed with oxygen at three liters per minute, and has experienced weight gain, which she attributes to steroid use for COPD flare-ups. She has reduced smoking to ten cigarettes a day. She is type 2 diabetic, controlled without insulin , and experiences nausea  potentially related to low oxygen levels. Her blood work shows elevated CRP and sed rate levels.      Patient presented with interventional treatment options. Ms. Branscom was informed that I will not be providing medication management. Pharmacotherapy evaluation including  recommendations may be offered, if specifically requested.   Controlled Substance Pharmacotherapy Assessment REMS (Risk Evaluation and Mitigation Strategy)  Opioid Analgesic: No chronic opioid analgesics therapy prescribed by our practice. None MME/day: 0 mg/day   Pill Count: None expected due to no prior prescriptions written by our practice. Dorlene Montie FALCON, RN  08/20/2024 10:21 AM  Sign when Signing Visit Safety precautions to be maintained throughout the outpatient stay will include: orient to surroundings, keep bed in low position, maintain call bell within reach at all times, provide assistance with transfer out of bed and ambulation.     Pharmacokinetics: Liberation and absorption (onset of action): WNL Distribution (time to peak effect): WNL Metabolism and excretion (duration of action): WNL         Pharmacodynamics: Desired effects: Analgesia: Ms. Alviar reports >50% benefit. Functional ability: Patient reports that medication allows her to accomplish basic ADLs Clinically meaningful improvement in function (CMIF): Sustained CMIF goals met Perceived effectiveness: Described as relatively effective, allowing for increase in activities of daily living (ADL) Undesirable effects: Side-effects or Adverse reactions: None reported Monitoring: [place] PMP: PDMP reviewed during this encounter. Online review of the past 70-month period previously conducted. Not applicable at this point since we have not taken over the patient's medication management yet. List of other Serum/Urine Drug Screening Test(s):  Lab Results  Component Value Date   [facility] <15 04/20/2024   List of all UDS test(s) done:  Lab Results  Component Value Date   SUMMARY FINAL 07/30/2024   Last UDS on record: Summary  Date Value Ref Range Status  07/30/2024 FINAL  Final    Comment:    ==================================================================== Compliance Drug Analysis,  Ur ==================================================================== Test                             Result       Flag       Units  Drug Present   Carboxy-THC                    81                      ng/mg creat    Carboxy-THC is a metabolite of tetrahydrocannabinol (THC). Source of    THC is most commonly herbal marijuana or marijuana-based products,    but THC is also present in a scheduled prescription medication.    Trace amounts of THC can be present in hemp and cannabidiol (CBD)    products. This test is not intended to distinguish between delta-9-    tetrahydrocannabinol, the predominant form of THC in most herbal or    marijuana-based products, and delta-8-tetrahydrocannabinol.    Gabapentin                      PRESENT   Cyclobenzaprine                PRESENT   Desmethylcyclobenzaprine       PRESENT    Desmethylcyclobenzaprine is an expected metabolite of    cyclobenzaprine.    Duloxetine   PRESENT   Propranolol                     PRESENT ==================================================================== Test                      Result    Flag   Units      Ref Range   Creatinine              187              mg/dL      >=79 ==================================================================== Declared Medications:  Medication list was not provided. ==================================================================== For clinical consultation, please call [phone]. ====================================================================    UDS interpretation: No unexpected findings.          Medication Assessment Form: Not applicable. No opioids. Treatment compliance: Not applicable Risk Assessment Profile: Aberrant behavior: See initial evaluations. None observed or detected today Comorbid factors increasing risk of overdose: See initial evaluation. No additional risks detected today Opioid risk tool (ORT):     08/20/2024   10:24 AM  Opioid  Risk   Alcohol 0  Illegal Drugs 0  Rx Drugs 0  Psychological Disease 0  Depression 0  Opioid Risk Tool Scoring 0  Opioid Risk Interpretation Low Risk    ORT Scoring interpretation table:  Score <3 = Low Risk for SUD  Score between 4-7 = Moderate Risk for SUD  Score >8 = High Risk for Opioid Abuse   Risk of substance use disorder (SUD): Low  Risk Mitigation Strategies:  Patient opioid safety counseling: No controlled substances prescribed. Patient-Prescriber Agreement (PPA): No agreement signed.  Controlled substance notification to other providers: None required. No opioid therapy.  Pharmacologic Plan: Non-opioid analgesic therapy offered. Interventional alternatives discussed.             Laboratory Chemistry Profile   Renal Lab Results  Component Value Date   BUN 5 (L) 07/30/2024   CREATININE 0.84 07/30/2024   LABCREA 116 08/05/2024   BCR 6 (L) 07/30/2024   GFRAA >60 12/30/2015   GFRNONAA >60 05/08/2024   SPECGRAV 1.015 07/21/2024   PHUR 6.5 07/21/2024   PROTEINUR Negative 07/21/2024     Electrolytes Lab Results  Component Value Date   NA 140 07/30/2024   K 4.7 07/30/2024   CL 101 07/30/2024   CALCIUM  9.1 07/30/2024   MG 2.1 07/30/2024     Hepatic Lab Results  Component Value Date   AST 39 07/30/2024   ALT 43 (H) 07/24/2024   ALBUMIN 3.8 (L) 07/30/2024   ALKPHOS 114 07/30/2024   LIPASE 38 06/07/2023     ID Lab Results  Component Value Date   HIV Non Reactive 04/21/2024   SARSCOV2NAA NEGATIVE 05/07/2024   PREGTESTUR NEGATIVE 08/09/2023     Bone Lab Results  Component Value Date   VD25OH 26 (L) 07/24/2024   25OHVITD1 20 (L) 07/30/2024   25OHVITD2 <1.0 07/30/2024   25OHVITD3 20 07/30/2024     Endocrine Lab Results  Component Value Date   GLUCOSE 127 (H) 07/30/2024   GLUCOSEU Trace (A) 07/21/2024   HGBA1C 6.8 (H) 07/24/2024   TSH 3.27 07/24/2024   FREET4 0.95 09/04/2023     Neuropathy Lab Results  Component Value Date   VITAMINB12  154 (L) 07/30/2024   HGBA1C 6.8 (H) 07/24/2024   HIV Non Reactive 04/21/2024     CNS No results found for: COLORCSF, APPEARCSF, RBCCOUNTCSF, WBCCSF, POLYSCSF, LYMPHSCSF, EOSCSF, PROTEINCSF, GLUCCSF, JCVIRUS, CSFOLI, IGGCSF, LABACHR, ACETBL  Inflammation (CRP: Acute  ESR: Chronic) Lab Results  Component Value Date   CRP 12 (H) 07/30/2024   ESRSEDRATE 44 (H) 07/30/2024   LATICACIDVEN 1.4 08/23/2022     Rheumatology No results found for: RF, ANA, LABURIC, URICUR, LYMEIGGIGMAB, LYMEABIGMQN, HLAB27   Coagulation Lab Results  Component Value Date   INR 0.9 04/20/2024   LABPROT 12.6 04/20/2024   APTT 28 04/20/2024   PLT 258 07/24/2024     Cardiovascular Lab Results  Component Value Date   BNP 55.6 05/07/2024   HGB 12.8 07/24/2024   HCT 38.9 07/24/2024     Screening Lab Results  Component Value Date   SARSCOV2NAA NEGATIVE 05/07/2024   COVIDSOURCE NASOPHARYNGEAL 07/14/2019   HIV Non Reactive 04/21/2024   PREGTESTUR NEGATIVE 08/09/2023     Cancer No results found for: CEA, CA125, LABCA2   Allergens No results found for: ALMOND, APPLE, ASPARAGUS, AVOCADO, BANANA, BARLEY, BASIL, BAYLEAF, GREENBEAN, LIMABEAN, WHITEBEAN, BEEFIGE, REDBEET, BLUEBERRY, BROCCOLI, CABBAGE, MELON, CARROT, CASEIN, CASHEWNUT, CAULIFLOWER, CELERY     Note: Lab results reviewed.  Recent Diagnostic Imaging Review  Cervical Imaging: Cervical DG Bending/F/E views: Results for orders placed during the hospital encounter of 08/12/24 DG Cervical Spine With Flex & Extend  Narrative CLINICAL DATA:  Chronic neck pain.  EXAM: DG CERVICAL SPINE COMP WITH FLEX  COMPARISON:  None Available.  FINDINGS: No fracture or spondylolisthesis is noted. No change in vertebral body alignment is noted on flexion or extension views. Mild degenerative disc disease is noted at C4-5. Moderate degenerative disc disease is noted  at C5-6. Moderate bilateral neural foraminal stenosis is noted at C5-6 secondary to uncovertebral spurring.  IMPRESSION: Multilevel degenerative disc disease as described above. No acute abnormality seen.   Electronically Signed By: Lynwood Landy Raddle M.D. On: 08/20/2024 08:22  Shoulder Imaging: Shoulder-R DG: Results for orders placed during the hospital encounter of 08/12/24 DG Shoulder Right  Narrative CLINICAL DATA:  Chronic right shoulder pain.  EXAM: RIGHT SHOULDER - 2+ VIEW  COMPARISON:  None Available.  FINDINGS: There is no evidence of fracture or dislocation. There is no evidence of arthropathy or other focal bone abnormality. Soft tissues are unremarkable.  IMPRESSION: Negative.   Electronically Signed By: Lynwood Landy Raddle M.D. On: 08/20/2024 08:24  Shoulder-L DG: Results for orders placed during the hospital encounter of 08/12/24 DG Shoulder Left  Narrative CLINICAL DATA:  Chronic left shoulder pain.  EXAM: LEFT SHOULDER - 2+ VIEW  COMPARISON:  None Available.  FINDINGS: There is no evidence of fracture or dislocation. There is no evidence of arthropathy or other focal bone abnormality. Soft tissues are unremarkable.  IMPRESSION: Negative.   Electronically Signed By: Lynwood Landy Raddle M.D. On: 08/20/2024 08:26  Lumbosacral Imaging: Lumbar DG Bending views: Results for orders placed during the hospital encounter of 08/12/24 DG Lumbar Spine Complete W/Bend  Narrative CLINICAL DATA:  Chronic lower back pain.  EXAM: LUMBAR SPINE - COMPLETE WITH BENDING VIEWS  COMPARISON:  None Available.  FINDINGS: There is no evidence of lumbar spine fracture. Alignment is normal. Intervertebral disc spaces are maintained.  IMPRESSION: Negative.   Electronically Signed By: Lynwood Landy Raddle M.D. On: 08/20/2024 08:23  Hip Imaging: Hip-B DG Bilateral (5V): Results for orders placed during the hospital encounter of 08/12/24 DG HIPS BILAT W OR W/O  PELVIS MIN 5 VIEWS  Narrative CLINICAL DATA:  Chronic bilateral hip pain.  EXAM: DG HIP (WITH OR WITHOUT PELVIS) 5+V BILAT  COMPARISON:  None Available.  FINDINGS: There  is no evidence of hip fracture or dislocation. There is no evidence of arthropathy or other focal bone abnormality.  IMPRESSION: Negative.   Electronically Signed By: Lynwood Landy Raddle M.D. On: 08/20/2024 08:27  Hand Imaging: Hand-R DG Complete: Results for orders placed in visit on 02/23/01 DG Hand Complete Right  Narrative FINDINGS CLINICAL DATA:  LACERATION WITH GLASS. RIGHT HAND, (THREE VIEWS): NO COMPARISON.  THERE IS NO EVIDENCE OF ACUTE FRACTURE, SUBLUXATION OR RADIOPAQUE FOREIGN BODY. IMPRESSION NEGATIVE.  Complexity Note: Imaging results reviewed. Results shared with Ms. Schwimmer, using Layman's terms.                         Meds   Current Outpatient Medications:    albuterol  (PROVENTIL ) (2.5 MG/3ML) 0.083% nebulizer solution, Take 2.5 mg by nebulization every 4 (four) hours as needed., Disp: , Rfl:    albuterol  (VENTOLIN  HFA) 108 (90 Base) MCG/ACT inhaler, Inhale 2 puffs into the lungs every 6 (six) hours as needed., Disp: 3 each, Rfl: 3   Cholecalciferol (VITAMIN D3) 125 MCG (5000 UT) CAPS, Take 1 capsule (5,000 Units total) by mouth daily with breakfast. Take along with calcium  and magnesium ., Disp: 90 capsule, Rfl: 0   Cyanocobalamin (VITAMIN B-12) 5000 MCG SUBL, Place 1 tablet (5,000 mcg total) under the tongue daily., Disp: 30 tablet, Rfl: 0   cyclobenzaprine (FLEXERIL) 5 MG tablet, Take 5 mg by mouth 3 (three) times daily as needed for muscle spasms., Disp: , Rfl:    DULoxetine  (CYMBALTA ) 60 MG capsule, Take 60 mg by mouth 2 (two) times daily., Disp: , Rfl:    Dupilumab  (DUPIXENT ) 300 MG/2ML SOAJ, Inject 300 mg into the skin every 14 (fourteen) days., Disp: 12 mL, Rfl: 1   Elagolix Sodium  (ORILISSA ) 200 MG TABS, Take 1 tablet (200 mg total) by mouth 2 (two) times daily., Disp: 60 tablet,  Rfl: 6   EPINEPHrine 0.3 mg/0.3 mL IJ SOAJ injection, Inject 0.3 mg into the muscle as needed for anaphylaxis., Disp: , Rfl:    [START ON 08/21/2024] ergocalciferol  (VITAMIN D2) 1.25 MG (50000 UT) capsule, Take 1 capsule (50,000 Units total) by mouth 2 (two) times a week. X 6 weeks., Disp: 12 capsule, Rfl: 0   eszopiclone (LUNESTA) 1 MG TABS tablet, Take 1 mg by mouth at bedtime as needed., Disp: , Rfl:    furosemide (LASIX) 20 MG tablet, Take 20 mg by mouth daily as needed., Disp: , Rfl:    gabapentin  (NEURONTIN ) 100 MG capsule, Take 400 mg by mouth 2 (two) times daily. Take 400mg  in the morning and 400mg  in the afternoon., Disp: , Rfl:    gabapentin  (NEURONTIN ) 600 MG tablet, Take 600 mg by mouth at bedtime., Disp: , Rfl:    lactulose  (CHRONULAC ) 10 GM/15ML solution, Take 30 mLs (20 g total) by mouth daily., Disp: 900 mL, Rfl: 3   linaclotide  (LINZESS ) 290 MCG CAPS capsule, TAKE 1 CAPSULE BY MOUTH DAILY BEFORE BREAKFAST., Disp: 90 capsule, Rfl: 0   montelukast  (SINGULAIR ) 10 MG tablet, Take 10 mg by mouth at bedtime., Disp: , Rfl:    ondansetron  (ZOFRAN ) 4 mg TABS tablet, Take by mouth every 8 (eight) hours as needed., Disp: , Rfl:    pantoprazole  (PROTONIX ) 40 MG tablet, Take 1 tablet (40 mg total) by mouth 2 (two) times daily before a meal., Disp: 60 tablet, Rfl: 0   propranolol  (INDERAL ) 20 MG tablet, Take 1 tablet (20 mg total) by mouth 3 (three) times daily as  needed., Disp: 90 tablet, Rfl: 3   propranolol  ER (INDERAL  LA) 120 MG 24 hr capsule, Take 120 mg by mouth daily., Disp: , Rfl:    rosuvastatin  (CRESTOR ) 40 MG tablet, Take 1 tablet (40 mg total) by mouth daily., Disp: 90 tablet, Rfl: 1   Simethicone 180 MG CAPS, Take 1 capsule by mouth 3 (three) times daily as needed., Disp: , Rfl:    Spacer/Aero-Holding Chambers (AEROCHAMBER MV) inhaler, Use as instructed, Disp: 1 each, Rfl: 0   SPIRIVA  HANDIHALER 18 MCG inhalation capsule, Place 1 capsule (18 mcg total) into inhaler and inhale daily.,  Disp: 90 capsule, Rfl: 3   SUMAtriptan (IMITREX) 25 MG tablet, Take 25 mg by mouth every 2 (two) hours as needed., Disp: , Rfl:    SYMBICORT  160-4.5 MCG/ACT inhaler, INHALE 2 PUFFS INTO THE LUNGS IN THE MORNING AND AT BEDTIME., Disp: 30.6 each, Rfl: 4   Vibegron  (GEMTESA ) 75 MG TABS, Take 1 tablet (75 mg total) by mouth daily., Disp: 42 tablet, Rfl:   ROS  Constitutional: Denies any fever or chills Gastrointestinal: No reported hemesis, hematochezia, vomiting, or acute GI distress Musculoskeletal: Denies any acute onset joint swelling, redness, loss of ROM, or weakness Neurological: No reported episodes of acute onset apraxia, aphasia, dysarthria, agnosia, amnesia, paralysis, loss of coordination, or loss of consciousness  Allergies  Ms. Heninger is allergic to amoxicillin.  PFSH  Drug: Ms. Heinemann  reports no history of drug use. Alcohol:  reports no history of alcohol use. Tobacco:  reports that she has been smoking cigarettes. She started smoking about 38 years ago. She has a 38.7 pack-year smoking history. She does not have any smokeless tobacco history on file. Medical:  has a past medical history of Asthma (03/1987), COPD (chronic obstructive pulmonary disease) (HCC), Dyspnea, GERD (gastroesophageal reflux disease), Headache, and Thyroid disease ([id number]). Surgical: Ms. Verhagen  has a past surgical history that includes Colonoscopy with propofol  (N/A, 08/09/2023) and polypectomy (N/A, 08/09/2023). Family: family history includes Anemia in her mother; Asthma in her maternal grandfather and mother; Breast cancer in her maternal grandmother; Colon cancer in her maternal uncle; Colon cancer (age of onset: 9) in her mother; Heart attack in her maternal grandfather; Heart disease in her maternal grandfather; Heart failure in her maternal grandfather; Hypertension in her maternal grandfather; Ovarian cancer (age of onset: 57) in her sister.  Constitutional Exam  General appearance: Well nourished,  well developed, and well hydrated. In no apparent acute distress Vitals:   08/20/24 1018  BP: (!) 154/59  Pulse: (!) 112  Resp: 20  Temp: (!) 97.3 F (36.3 C)  SpO2: 100%  Weight: 244 lb (110.7 kg)  Height: 5' 3 (1.6 m)   BMI Assessment: Estimated body mass index is 43.22 kg/m as calculated from the following:   Height as of this encounter: 5' 3 (1.6 m).   Weight as of this encounter: 244 lb (110.7 kg).  BMI interpretation table: BMI level Category Range association with higher incidence of chronic pain  <18 kg/m2 Underweight   18.5-24.9 kg/m2 Ideal body weight   25-29.9 kg/m2 Overweight Increased incidence by 20%  30-34.9 kg/m2 Obese (Class I) Increased incidence by 68%  35-39.9 kg/m2 Severe obesity (Class II) Increased incidence by 136%  >40 kg/m2 Extreme obesity (Class III) Increased incidence by 254%   Patient's current BMI Ideal Body weight  Body mass index is 43.22 kg/m. Ideal body weight: 52.4 kg (115 lb 8.3 oz) Adjusted ideal body weight: 75.7 kg (166 lb 14.6 oz)  BMI Readings from Last 4 Encounters:  08/20/24 43.22 kg/m  08/05/24 43.36 kg/m  07/30/24 45.81 kg/m  07/24/24 45.17 kg/m   Wt Readings from Last 4 Encounters:  08/20/24 244 lb (110.7 kg)  08/05/24 244 lb 12.8 oz (111 kg)  07/30/24 258 lb 9.6 oz (117.3 kg)  07/24/24 255 lb (115.7 kg)    Psych/Mental status: Alert, oriented x 3 (person, place, & time)       Eyes: PERLA Respiratory: No evidence of acute respiratory distress  Assessment & Plan  Primary Diagnosis & Pertinent Problem List: The primary encounter diagnosis was Chronic low back pain (Bilateral) w/o sciatica. Diagnoses of Chronic lower extremity pain (Bilateral), Chronic neck pain (Bilateral), Chronic shoulder pain (Bilateral) (R>L), Low back pain of over 3 months duration, Low back pain radiating to both legs, Multifactorial low back pain, Cervical neck pain with evidence of disc disease, Abnormal MRI, lumbar spine (11/12/2023)  ([facility]), Chronic pain syndrome, Vitamin D  deficiency, Elevated sed rate, Elevated C-reactive protein (CRP), Vitamin B12 deficiency, and Elevated hemoglobin A1c were also pertinent to this visit. Visit Diagnosis: 1. Chronic low back pain (Bilateral) w/o sciatica   2. Chronic lower extremity pain (Bilateral)   3. Chronic neck pain (Bilateral)   4. Chronic shoulder pain (Bilateral) (R>L)   5. Low back pain of over 3 months duration   6. Low back pain radiating to both legs   7. Multifactorial low back pain   8. Cervical neck pain with evidence of disc disease   9. Abnormal MRI, lumbar spine (11/12/2023) ([facility])   10. Chronic pain syndrome   11. Vitamin D  deficiency   12. Elevated sed rate   13. Elevated C-reactive protein (CRP)   14. Vitamin B12 deficiency   15. 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 Moderate bilateral neural foraminal stenosis at C5-6 may affect the C6 nerve, correlating with right-sided radiculopathy symptoms. She experiences neck pain radiating to the right shoulder and down the right upper extremity, affecting the middle, ring, and pinky fingers in a C7/C8 dermatomotor distribution. Schedule a cervical epidural steroid injection, focusing on the right side. Discussed the need to lie on her stomach during the procedure and limited sedation due to breathing issues.  Chronic pain syndrome   Chronic pain syndrome primarily affects the neck with secondary pain in the  lower back. Pain management is complicated by vitamin deficiencies and elevated inflammatory markers. Consider a cervical epidural steroid injection as part of the management plan.  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 Lumbar spine x-rays show no fracture or dislocation, with normal alignment and maintained intervertebral disc spaces. Pain may be referred from the lumbar spine or related to soft tissue issues.  Vitamin D  deficiency   Vitamin D  deficiency identified, important for pain modulation, may exacerbate pain perception. Prescribe  vitamin D3 5000 IU daily for lifelong supplementation and vitamin D2 twice weekly for six weeks.  Vitamin B12 deficiency   Vitamin B12 deficiency can lead to peripheral neuropathy with symptoms such as pain, numbness, or burning sensations in the extremities. Deficiency is reversible with supplementation. Prescribe vitamin B12 supplements.  Elevated inflammatory markers (CRP and ESR)   Elevated CRP and ESR indicate acute and chronic inflammation, respectively. Potential differential diagnoses include autoimmune diseases such as rheumatoid arthritis or lupus. Elevated markers may also indicate increased cardiovascular risk. Order additional blood work to investigate potential autoimmune disease.  Chronic obstructive pulmonary disease (COPD) on oxygen   COPD is managed with oxygen therapy at three liters per minute. Breathing concerns noted, potentially exacerbated by tobacco use.  Tobacco use disorder   Tobacco use disorder involves smoking approximately ten cigarettes per day, reduced from two packs per day. Smoking cessation is critical to improve respiratory function and reduce the risk of further degenerative changes.  Type 2 diabetes mellitus, controlled   Type 2 diabetes mellitus is  currently controlled without insulin . Discussed awareness of potential blood sugar elevation with steroid use.  Nausea   Nausea is potentially related to low oxygen levels.  Weight gain   Recent weight gain noted, possibly related to steroid use for COPD  management.        Pharmacotherapy (Medications Ordered): Meds ordered this encounter  Medications   ergocalciferol  (VITAMIN D2) 1.25 MG (50000 UT) capsule    Sig: Take 1 capsule (50,000 Units total) by mouth 2 (two) times a week. X 6 weeks.    Dispense:  12 capsule    Refill:  0    Fill one day early if pharmacy is closed on scheduled refill date. May substitute for generic, or similar, if available.   Cholecalciferol (VITAMIN D3) 125 MCG (5000 UT) CAPS    Sig: Take 1 capsule (5,000 Units total) by mouth daily with breakfast. Take along with calcium  and magnesium .    Dispense:  90 capsule    Refill:  0    Fill 1 day early if pharmacy is closed on scheduled refill date. Generic permitted. Do not send renewal requests.   Cyanocobalamin (VITAMIN B-12) 5000 MCG SUBL    Sig: Place 1 tablet (5,000 mcg total) under the tongue daily.    Dispense:  30 tablet    Refill:  0    Fill 1 day early if pharmacy is closed on scheduled refill date. Generic permitted. Do not send renewal requests.   Procedure Orders         Cervical Epidural Injection     Orders Placed This Encounter  Procedures   Cervical Epidural Injection    Sedation: Patient's choice. Purpose: Diagnostic/Therapeutic Indication(s): Radiculitis and cervicalgia associater with cervical degenerative disc disease.    Standing Status:   Future    Expiration Date:   11/19/2024    Scheduling Instructions:     Procedure: Cervical Epidural Steroid Injection/Block     Level(s): C7-T1     Laterality: Right-sided     Timeframe: As soon as schedule allows.    Where will this procedure be performed?:   [facility]             by Dr. Tanya   Rheumatoid factor    Cc PCP:  Kayla Jeoffrey RAMAN, FNP    CC Results:   PCP-NURSE [phone]    Release to patient:   Immediate   ANA w/Reflex if Positive    Cc  PCP: Kayla Jeoffrey RAMAN, FNP    CC Results:   PCP-NURSE [phone]    Release to patient:   Immediate   Uric acid    Cc PCP: Kayla Jeoffrey RAMAN, FNP    CC Results:   PCP-NURSE [phone]    Release to patient:   Immediate   Lab Orders         Rheumatoid factor         ANA w/Reflex if Positive         Uric acid     Imaging Orders  No imaging studies ordered today   Referral Orders  No referral(s) requested today    Pharmacological management:  Opioid Analgesics: I will not be prescribing any opioids at this time Membrane stabilizer: I will not be prescribing any at this time Muscle relaxant: I will not be prescribing any at this time NSAID: I will not be prescribing any at this time Other analgesic(s): I will not be prescribing any at this time      Interventional Therapies  Risk Factors  Considerations  Medical Comorbidities:  (07/30/2024) UDS (+) carboxy-THC  MO (BMI>40)  COPD on O2 @ 3 L/min  Smoker  GERD  IBS  OSA  GAD  T2NIDDM  HTN     Planned  Pending:   Diagnostic/therapeutic right cervical ESI #1    Under consideration:   Diagnostic/therapeutic right cervical ESI #1    Completed: (Analgesic benefit)1  None at this time   Therapeutic  Palliative (PRN) options:   None established   Completed by other providers:   Therapeutic right greater trochanteric bursa inj. x1 by Debby Amber, PA (11/30/2023) [facility])  Therapeutic left greater trochanteric bursa inj. x1 by Debby Amber, PA (03/26/2024) ([facility])   1(Analgesic benefit): Expressed in percentage (%). (Local anesthetic[LA] +/- sedation  L.A.Local Anesthetic  Steroid benefit  Ongoing benefit)      Provider-requested follow-up: Return for [facility]): (R) CESI #1. Recent Visits Date Type Provider Dept  07/30/24 Office Visit Tanya Glisson, MD [facility]  Showing recent visits within past 90 days and meeting all other requirements Today's Visits Date Type Provider Dept  08/20/24 Office Visit Tanya Glisson, MD [facility]  Showing today's visits and meeting all other requirements Future Appointments No visits were found meeting these conditions. Showing future appointments within next 90 days and meeting all other requirements   Primary Care Physician: Kayla Jeoffrey RAMAN, FNP  Duration of encounter: 76 minutes.  Total time on encounter, as per AMA guidelines included both the face-to-face and non-face-to-face time personally spent by the physician and/or other qualified health care professional(s) on the day of the encounter (includes time in activities that require the physician or other qualified health care professional and does not include time in activities normally performed by clinical staff). Physician's time may include the following activities when performed: Preparing to see the patient (e.g., pre-charting review of records, searching for previously ordered imaging, lab work, and nerve conduction tests) Review of prior analgesic pharmacotherapies. Reviewing PMP Interpreting ordered tests (e.g., lab work, imaging, nerve conduction tests) Performing post-procedure evaluations, including interpretation of diagnostic procedures Obtaining and/or reviewing separately obtained history Performing a medically appropriate examination and/or evaluation Counseling and educating the patient/family/caregiver Ordering medications, tests, or procedures Referring and communicating with other health care professionals (when not separately reported) Documenting clinical information in the electronic or other health record Independently interpreting results (not separately reported) and communicating results to the patient/ family/caregiver  Care coordination (not separately reported)  Note by: Eric DELENA Como, MD (TTS  technology used. I apologize for any typographical errors that were not detected and corrected.) Date: 08/20/2024; Time: 11:43 AM

## 2024-08-20 NOTE — Progress Notes (Signed)
 Safety precautions to be maintained throughout the outpatient stay will include: orient to surroundings, keep bed in low position, maintain call bell within reach at all times, provide assistance with transfer out of bed and ambulation.

## 2024-08-20 NOTE — Patient Instructions (Addendum)
 Epidural Steroid Injection Patient Information  Description: The epidural space surrounds the nerves as they exit the spinal cord.  In some patients, the nerves can be compressed and inflamed by a bulging disc or a tight spinal canal (spinal stenosis).  By injecting steroids into the epidural space, we can bring irritated nerves into direct contact with a potentially helpful medication.  These steroids act directly on the irritated nerves and can reduce swelling and inflammation which often leads to decreased pain.  Epidural steroids may be injected anywhere along the spine and from the neck to the low back depending upon the location of your pain.   After numbing the skin with local anesthetic (like Novocaine), a small needle is passed into the epidural space slowly.  You may experience a sensation of pressure while this is being done.  The entire block usually last less than 10 minutes.  Conditions which may be treated by epidural steroids:  Low back and leg pain Neck and arm pain Spinal stenosis Post-laminectomy syndrome Herpes zoster (shingles) pain Pain from compression fractures  Preparation for the injection:  Do not eat any solid food or dairy products within 8 hours of your appointment.  You may drink clear liquids up to 3 hours before appointment.  Clear liquids include water , black coffee, juice or soda.  No milk or cream please. You may take your regular medication, including pain medications, with a sip of water  before your appointment  Diabetics should hold regular insulin  (if taken separately) and take 1/2 normal NPH dos the morning of the procedure.  Carry some sugar containing items with you to your appointment. A driver must accompany you and be prepared to drive you home after your procedure.  Bring all your current medications with your. An IV may be inserted and sedation may be given at the discretion of the physician.   A blood pressure cuff, EKG and other monitors will  often be applied during the procedure.  Some patients may need to have extra oxygen administered for a short period. You will be asked to provide medical information, including your allergies, prior to the procedure.  We must know immediately if you are taking blood thinners (like Coumadin/Warfarin)  Or if you are allergic to IV iodine contrast (dye). We must know if you could possible be pregnant.  Possible side-effects: Bleeding from needle site Infection (rare, may require surgery) Nerve injury (rare) Numbness & tingling (temporary) Difficulty urinating (rare, temporary) Spinal headache ( a headache worse with upright posture) Light -headedness (temporary) Pain at injection site (several days) Decreased blood pressure (temporary) Weakness in arm/leg (temporary) Pressure sensation in back/neck (temporary)  Call if you experience: Fever/chills associated with headache or increased back/neck pain. Headache worsened by an upright position. New onset weakness or numbness of an extremity below the injection site Hives or difficulty breathing (go to the emergency room) Inflammation or drainage at the infection site Severe back/neck pain Any new symptoms which are concerning to you  Please note:  Although the local anesthetic injected can often make your back or neck feel good for several hours after the injection, the pain will likely return.  It takes 3-7 days for steroids to work in the epidural space.  You may not notice any pain relief for at least that one week.  If effective, we will often do a series of three injections spaced 3-6 weeks apart to maximally decrease your pain.  After the initial series, we generally will wait several months before  considering a repeat injection of the same type.  If you have any questions, please call [phone] [facility] ______________________________________________________________________    Procedure  instructions  Stop blood-thinners  Do not eat or drink fluids (other than water ) for 6 hours before your procedure  No water  for 2 hours before your procedure  Take your blood pressure medicine with a sip of water   Arrive 30 minutes before your appointment  If sedation is planned, bring suitable driver. [place], [place], & public transportation are NOT APPROVED)  Carefully read the Preparing for your procedure detailed instructions  If you have questions call us  at [phone]  Procedure appointments are for procedures only.   NO medication refills or new problem evaluations will be done on procedure days.   Only the scheduled, pre-approved procedure and side will be done.   ______________________________________________________________________     ______________________________________________________________________    Preparing for your procedure  Appointments: If you think you may not be able to keep your appointment, call 24-48 hours in advance to cancel. We need time to make it available to others.  Procedure visits are for procedures only. During your procedure appointment there will be: NO Prescription Refills*. NO medication changes or discussions*. NO discussion of disability issues*. NO unrelated pain problem evaluations*. NO evaluations to order other pain procedures*. *These will be addressed at a separate and distinct evaluation encounter on the provider's evaluation schedule and not during procedure days.  Instructions: Food intake: Avoid eating anything solid for at least 8 hours prior to your procedure. Clear liquid intake: You may take clear liquids such as water  up to 2 hours prior to your procedure. (No carbonated drinks. No soda.) Transportation: Unless otherwise stated by your physician, bring a driver. (Driver cannot be a Market researcher, Pharmacist, community, or any other form of public transportation.) Morning Medicines: Except for blood thinners, take all of your other  morning medications with a sip of water . Make sure to take your heart and blood pressure medicines. If your blood pressure's lower number is above 100, the case will be rescheduled. Blood thinners: Make sure to stop your blood thinners as instructed.  If you take a blood thinner, but were not instructed to stop it, call our office [phone] and ask to talk to a nurse. Not stopping a blood thinner prior to certain procedures could lead to serious complications. Diabetics on insulin : Notify the staff so that you can be scheduled 1st case in the morning. If your diabetes requires high dose insulin , take only  of your normal insulin  dose the morning of the procedure and notify the staff that you have done so. Preventing infections: Shower with an antibacterial soap the morning of your procedure.  Build-up your immune system: Take 1000 mg of Vitamin C with every meal (3 times a day) the day prior to your procedure. Antibiotics: Inform the nursing staff if you are taking any antibiotics or if you have any conditions that may require antibiotics prior to procedures. (Example: recent joint implants)   Pregnancy: If you are pregnant make sure to notify the nursing staff. Not doing so may result in injury to the fetus, including death.  Sickness: If you have a cold, fever, or any active infections, call and cancel or reschedule your procedure. Receiving steroids while having an infection may result in complications. Arrival: You must be in the facility at least 30 minutes prior to your scheduled procedure. Tardiness: Your scheduled time is also the cutoff time.  If you do not arrive at least 15 minutes prior to your procedure, you will be rescheduled.  Children: Do not bring any children with you. Make arrangements to keep them home. Dress appropriately: There is always a possibility that your clothing may get soiled. Avoid long dresses. Valuables: Do not bring any jewelry or valuables.  Reasons to call and  reschedule or cancel your procedure: (Following these recommendations will minimize the risk of a serious complication.) Surgeries: Avoid having procedures within 2 weeks of any surgery. (Avoid for 2 weeks before or after any surgery). Flu Shots: Avoid having procedures within 2 weeks of a flu shots or . (Avoid for 2 weeks before or after immunizations). Barium: Avoid having a procedure within 7-10 days after having had a radiological study involving the use of radiological contrast. (Myelograms, Barium swallow or enema study). Heart attacks: Avoid any elective procedures or surgeries for the initial 6 months after a Myocardial Infarction (Heart Attack). Blood thinners: It is imperative that you stop these medications before procedures. Let us  know if you if you take any blood thinner.  Infection: Avoid procedures during or within two weeks of an infection (including chest colds or gastrointestinal problems). Symptoms associated with infections include: Localized redness, fever, chills, night sweats or profuse sweating, burning sensation when voiding, cough, congestion, stuffiness, runny nose, sore throat, diarrhea, nausea, vomiting, cold or Flu symptoms, recent or current infections. It is specially important if the infection is over the area that we intend to treat. Heart and lung problems: Symptoms that may suggest an active cardiopulmonary problem include: cough, chest pain, breathing difficulties or shortness of breath, dizziness, ankle swelling, uncontrolled high or unusually low blood pressure, and/or palpitations. If you are experiencing any of these symptoms, cancel your procedure and contact your primary care physician for an evaluation.  Remember:  Regular Business hours are:  Monday to Thursday 8:00 AM to 4:00 PM  Provider's Schedule: Eric Como, MD:  Procedure days: Tuesday and Thursday 7:30 AM to 4:00 PM  Wallie Sherry, MD:  Procedure days: Monday and Wednesday 7:30 AM to 4:00  PM Last  Updated: 11/27/2023 ______________________________________________________________________     ______________________________________________________________________    General Risks and Possible Complications  Patient Responsibilities: It is important that you read this as it is part of your informed consent. It is our duty to inform you of the risks and possible complications associated with treatments offered to you. It is your responsibility as a patient to read this and to ask questions about anything that is not clear or that you believe was not covered in this document.  Patient's Rights: You have the right to refuse treatment. You also have the right to change your mind, even after initially having agreed to have the treatment done. However, under this last option, if you wait until the last second to change your mind, you may be charged for the materials used up to that point.  Introduction: Medicine is not an Visual merchandiser. Everything in Medicine, including the lack of treatment(s), carries the potential for danger, harm, or loss (which is by definition: Risk). In Medicine, a complication is a secondary problem, condition, or disease that can aggravate an already existing one. All treatments carry the risk of possible complications. The fact that a side effects or complications occurs, does not imply that the treatment was conducted incorrectly. It must be clearly understood that these can happen even when everything is done following the highest safety standards.  No treatment: You can choose  not to proceed with the proposed treatment alternative. The "PRO(s)" would include: avoiding the risk of complications associated with the therapy. The "CON(s)" would include: not getting any of the treatment benefits. These benefits fall under one of three categories: diagnostic; therapeutic; and/or palliative. Diagnostic benefits include: getting information which can ultimately lead to  improvement of the disease or symptom(s). Therapeutic benefits are those associated with the successful treatment of the disease. Finally, palliative benefits are those related to the decrease of the primary symptoms, without necessarily curing the condition (example: decreasing the pain from a flare-up of a chronic condition, such as incurable terminal cancer).  General Risks and Complications: These are associated to most interventional treatments. They can occur alone, or in combination. They fall under one of the following six (6) categories: no benefit or worsening of symptoms; bleeding; infection; nerve damage; allergic reactions; and/or death. No benefits or worsening of symptoms: In Medicine there are no guarantees, only probabilities. No healthcare provider can ever guarantee that a medical treatment will work, they can only state the probability that it may. Furthermore, there is always the possibility that the condition may worsen, either directly, or indirectly, as a consequence of the treatment. Bleeding: This is more common if the patient is taking a blood thinner, either prescription or over the counter (example: Goody Powders, Fish oil, Aspirin, Garlic, etc.), or if suffering a condition associated with impaired coagulation (example: Hemophilia, cirrhosis of the liver, low platelet counts, etc.). However, even if you do not have one on these, it can still happen. If you have any of these conditions, or take one of these drugs, make sure to notify your treating physician. Infection: This is more common in patients with a compromised immune system, either due to disease (example: diabetes, cancer, human immunodeficiency virus [HIV], etc.), or due to medications or treatments (example: therapies used to treat cancer and rheumatological diseases). However, even if you do not have one on these, it can still happen. If you have any of these conditions, or take one of these drugs, make sure to notify  your treating physician. Nerve Damage: This is more common when the treatment is an invasive one, but it can also happen with the use of medications, such as those used in the treatment of cancer. The damage can occur to small secondary nerves, or to large primary ones, such as those in the spinal cord and brain. This damage may be temporary or permanent and it may lead to impairments that can range from temporary numbness to permanent paralysis and/or brain death. Allergic Reactions: Any time a substance or material comes in contact with our body, there is the possibility of an allergic reaction. These can range from a mild skin rash (contact dermatitis) to a severe systemic reaction (anaphylactic reaction), which can result in death. Death: In general, any medical intervention can result in death, most of the time due to an unforeseen complication. ______________________________________________________________________      ______________________________________________________________________    Steroid injections  Common steroids for injections Triamcinolone: Used by many sports medicine physicians for large joint and bursal injections, often combined with a local anesthetic like lidocaine . A study focusing on coccydynia (tailbone pain) found triamcinolone was more effective than betamethasone, suggesting it may also be preferable for other localized inflammation conditions. Methylprednisolone : A common alternative to triamcinolone that is also a strong anti-inflammatory. It is available in different formulations, with the acetate suspension being the long-acting option for intra-articular injections. Dexamethasone: This is a non-particulate steroid,  meaning it has a lower risk of tissue damage compared to particulate steroids like triamcinolone and methylprednisolone . While less common for this specific use, it is an option for targeted injections.   Considerations for physicians Particulate vs.  non-particulate steroids: Triamcinolone and methylprednisolone  are particulate, meaning they can clump together. Dexamethasone is non-particulate. Particulate steroids are often preferred for their longer-lasting effects but carry a theoretical higher risk for certain injections (though this is less of a concern in the costochondral joints). Combined injectate: Corticosteroids are typically mixed with a local anesthetic like lidocaine  to provide both immediate pain relief (from the anesthetic) and longer-term inflammation reduction (from the steroid). Imaging guidance: To ensure accurate placement of the needle and medication, physicians may use ultrasound or fluoroscopic guidance for the injection, especially in complex or refractory cases.   Patient guidance Before undergoing a steroid injection, discuss the options with your physician. They will determine the best steroid, dosage, and procedure for your specific case based on factors like: Severity of your condition History of response to other treatments Your overall health status Experience and preference of the physician  Last  Updated: 08/12/2024 ______________________________________________________________________

## 2024-08-21 ENCOUNTER — Ambulatory Visit: Payer: Self-pay | Admitting: Family Medicine

## 2024-08-21 LAB — ANA W/REFLEX IF POSITIVE: Anti Nuclear Antibody (ANA): NEGATIVE

## 2024-08-22 ENCOUNTER — Encounter (INDEPENDENT_AMBULATORY_CARE_PROVIDER_SITE_OTHER): Payer: Self-pay

## 2024-08-22 LAB — COMPREHENSIVE METABOLIC PANEL WITH GFR
AG Ratio: 1.5 (calc) (ref 1.0–2.5)
ALT: 74 U/L — ABNORMAL HIGH (ref 6–29)
AST: 52 U/L — ABNORMAL HIGH (ref 10–35)
Albumin: 4.3 g/dL (ref 3.6–5.1)
Alkaline phosphatase (APISO): 91 U/L (ref 31–125)
BUN/Creatinine Ratio: 6 (calc) (ref 6–22)
BUN: 5 mg/dL — ABNORMAL LOW (ref 7–25)
CO2: 28 mmol/L (ref 20–32)
Calcium: 9.5 mg/dL (ref 8.6–10.2)
Chloride: 103 mmol/L (ref 98–110)
Creat: 0.78 mg/dL (ref 0.50–0.99)
Globulin: 2.8 g/dL (ref 1.9–3.7)
Glucose, Bld: 135 mg/dL — ABNORMAL HIGH (ref 65–99)
Potassium: 4 mmol/L (ref 3.5–5.3)
Sodium: 139 mmol/L (ref 135–146)
Total Bilirubin: 0.3 mg/dL (ref 0.2–1.2)
Total Protein: 7.1 g/dL (ref 6.1–8.1)
eGFR: 94 mL/min/1.73m2 (ref >=60)

## 2024-08-22 LAB — HEPATITIS PANEL, ACUTE
Hep A IgM: NONREACTIVE
Hep B C IgM: NONREACTIVE
Hepatitis B Surface Ag: NONREACTIVE
Hepatitis C Ab: NONREACTIVE

## 2024-08-22 LAB — TEST AUTHORIZATION

## 2024-08-22 LAB — RHEUMATOID FACTOR: Rheumatoid fact SerPl-aCnc: <10 [IU]/mL (ref <14.0)

## 2024-08-25 ENCOUNTER — Other Ambulatory Visit (HOSPITAL_COMMUNITY): Payer: Self-pay

## 2024-08-25 ENCOUNTER — Other Ambulatory Visit: Payer: Self-pay

## 2024-08-25 NOTE — Progress Notes (Signed)
 Specialty Pharmacy Refill Coordination Note  MyChart Questionnaire Submission  Lindsay Harris is a 48 y.o. female contacted today regarding refills of specialty medication(s) Dupixent .  Doses on hand: 1 for 08/27/24   Injection date: 09/10/24  Patient requested: Delivery   Delivery date: 08/27/24  Verified address: [address] [place] [address]  Medication will be filled on 08/26/24.

## 2024-08-25 NOTE — Progress Notes (Signed)
 Clinical Intervention Note  Clinical Intervention Notes: Patient reported starting a Vitamin D  suuplement and Vitamin B12. No DDIs identified with Dupixent    Clinical Intervention Outcomes: Prevention of an adverse drug event   Advertising account planner

## 2024-08-26 ENCOUNTER — Other Ambulatory Visit: Payer: Self-pay

## 2024-08-26 DIAGNOSIS — E118 Type 2 diabetes mellitus with unspecified complications: Secondary | ICD-10-CM

## 2024-08-26 DIAGNOSIS — E7800 Pure hypercholesterolemia, unspecified: Secondary | ICD-10-CM

## 2024-08-26 DIAGNOSIS — E66813 Obesity, class 3: Secondary | ICD-10-CM

## 2024-09-03 ENCOUNTER — Other Ambulatory Visit

## 2024-09-08 ENCOUNTER — Other Ambulatory Visit: Payer: Self-pay | Admitting: Family Medicine

## 2024-09-08 ENCOUNTER — Other Ambulatory Visit: Payer: Self-pay | Admitting: Obstetrics & Gynecology

## 2024-09-08 DIAGNOSIS — R1013 Epigastric pain: Secondary | ICD-10-CM

## 2024-09-09 ENCOUNTER — Encounter: Payer: Self-pay | Admitting: Pain Medicine

## 2024-09-09 ENCOUNTER — Ambulatory Visit (HOSPITAL_BASED_OUTPATIENT_CLINIC_OR_DEPARTMENT_OTHER): Admitting: Pain Medicine

## 2024-09-09 ENCOUNTER — Ambulatory Visit
Admission: RE | Admit: 2024-09-09 | Discharge: 2024-09-09 | Disposition: A | Discharge status: Home / Self Care | Source: Ambulatory Visit | Attending: Pain Medicine | Admitting: Pain Medicine

## 2024-09-09 VITALS — BP 79/29 | HR 83 | Temp 96.6°F | Resp 18 | Ht 63.0 in | Wt 255.0 lb

## 2024-09-09 DIAGNOSIS — J439 Emphysema, unspecified: Secondary | ICD-10-CM | POA: Diagnosis present

## 2024-09-09 DIAGNOSIS — M549 Dorsalgia, unspecified: Secondary | ICD-10-CM | POA: Diagnosis present

## 2024-09-09 DIAGNOSIS — M25511 Pain in right shoulder: Secondary | ICD-10-CM | POA: Diagnosis present

## 2024-09-09 DIAGNOSIS — F17200 Nicotine dependence, unspecified, uncomplicated: Secondary | ICD-10-CM | POA: Insufficient documentation

## 2024-09-09 DIAGNOSIS — M542 Cervicalgia: Secondary | ICD-10-CM | POA: Diagnosis not present

## 2024-09-09 DIAGNOSIS — G8929 Other chronic pain: Secondary | ICD-10-CM | POA: Insufficient documentation

## 2024-09-09 DIAGNOSIS — M25519 Pain in unspecified shoulder: Secondary | ICD-10-CM | POA: Insufficient documentation

## 2024-09-09 DIAGNOSIS — M5030 Other cervical disc degeneration, unspecified cervical region: Secondary | ICD-10-CM | POA: Insufficient documentation

## 2024-09-09 DIAGNOSIS — M5090 Cervical disc disorder, unspecified, unspecified cervical region: Secondary | ICD-10-CM | POA: Insufficient documentation

## 2024-09-09 DIAGNOSIS — M5412 Radiculopathy, cervical region: Secondary | ICD-10-CM

## 2024-09-09 DIAGNOSIS — M25512 Pain in left shoulder: Secondary | ICD-10-CM | POA: Insufficient documentation

## 2024-09-09 MED ORDER — DEXAMETHASONE SODIUM PHOSPHATE 10 MG/ML IJ SOLN
10.0000 mg | Freq: Once | INTRAMUSCULAR | Status: AC
  Administered 2024-09-09: 10 mg
  Filled 2024-09-09: qty 1

## 2024-09-09 MED ORDER — MIDAZOLAM HCL 2 MG/2ML IJ SOLN
0.5000 mg | Freq: Once | INTRAMUSCULAR | Status: AC
  Administered 2024-09-09: 2 mg via INTRAVENOUS
  Filled 2024-09-09: qty 2

## 2024-09-09 MED ORDER — PENTAFLUOROPROP-TETRAFLUOROETH EX AERO
INHALATION_SPRAY | Freq: Once | CUTANEOUS | Status: AC
  Administered 2024-09-09: 30 via TOPICAL

## 2024-09-09 MED ORDER — LIDOCAINE HCL 2 % IJ SOLN
20.0000 mL | Freq: Once | INTRAMUSCULAR | Status: AC
  Administered 2024-09-09: 100 mg
  Filled 2024-09-09: qty 40

## 2024-09-09 MED ORDER — IOHEXOL 180 MG/ML  SOLN
10.0000 mL | Freq: Once | INTRAMUSCULAR | Status: AC
  Administered 2024-09-09: 10 mL via EPIDURAL
  Filled 2024-09-09: qty 20

## 2024-09-09 MED ORDER — SODIUM CHLORIDE 0.9% FLUSH
1.0000 mL | Freq: Once | INTRAVENOUS | Status: AC
  Administered 2024-09-09: 1 mL

## 2024-09-09 MED ORDER — ROPIVACAINE HCL 2 MG/ML IJ SOLN
1.0000 mL | Freq: Once | INTRAMUSCULAR | Status: AC
  Administered 2024-09-09: 1 mL via EPIDURAL
  Filled 2024-09-09: qty 20

## 2024-09-09 NOTE — Patient Instructions (Signed)

## 2024-09-09 NOTE — Progress Notes (Signed)
 Safety precautions to be maintained throughout the outpatient stay will include: orient to surroundings, keep bed in low position, maintain call bell within reach at all times, provide assistance with transfer out of bed and ambulation.

## 2024-09-09 NOTE — Progress Notes (Signed)
 PROVIDER NOTE: Interpretation of information contained herein should be left to medically-trained personnel. Specific patient instructions are provided elsewhere under Patient Instructions section of medical record. This document was created in part using STT-dictation technology, any transcriptional errors that may result from this process are unintentional.  Patient: Lindsay Harris Type: Established DOB: 1976/03/07 MRN: [id number] PCP: Kayla Jeoffrey RAMAN, FNP  Service: Procedure DOS: 09/09/2024 Setting: Ambulatory Location: Ambulatory outpatient facility Delivery: Face-to-face Provider: Eric DELENA Como, MD Specialty: Interventional Pain Management Specialty designation: 09 Location: Outpatient facility Ref. Prov.: Kayla Jeoffrey RAMAN, FNP       Interventional Therapy   Type: Cervical Epidural Steroid injection (CESI) (Interlaminar) #1  Laterality: Right (-RT)  Level: C7-T1 DOS: 09/09/2024  Provider: Eric DELENA Como, MD Imaging: Fluoroscopy-guided Spinal ([id number]) Anesthesia: Local anesthesia (1-2% Lidocaine ) Anxiolysis: IV Versed  2.0 mg Sedation: No Sedation                        Medical Necessity Purpose: Diagnostic/Therapeutic Rationale (medical necessity): procedure needed and proper for the diagnosis and/or treatment of Lindsay Harris's medical symptoms and needs. Indications: Cervicalgia, cervical radicular pain, degenerative disc disease, severe enough to impact quality of life or function. 1. Chronic neck pain (Bilateral)   2. Cervicalgia   3. Cervical radiculitis (Right)   4. Cervical neck pain with evidence of disc disease   5. Chronic shoulder pain (Bilateral) (R>L)   6. Neck pain of over 3 months duration   7. Neck and shoulder pain   8. Posterior neck pain   9. Chronic neck and back pain   10. DDD (degenerative disc disease), cervical    NAS-11 Pain score:   Pre-procedure: 8 /10   Post-procedure: 0-No pain/10     Position  Prep  Materials:  Location  setting: Procedure suite Position: Prone, on modified reverse trendelenburg to facilitate breathing, with head in head-cradle. Pillows positioned under chest (below chin-level) with cervical spine flexed. Safety Precautions: Patient was assessed for positional comfort and pressure points before starting the procedure. Prepping solution: DuraPrep (Iodine Povacrylex [0.7% available iodine] and Isopropyl Alcohol, 74% w/w) Prep Area: Entire  cervicothoracic region Approach: percutaneous, paramedial Intended target: Posterior cervical epidural space Materials Procedure:  Tray: Epidural Needle(s): Epidural (Tuohy) Qty: 1 Length: (90mm) 3.5-inch Gauge: 17G  H&P (Pre-op Assessment):  Lindsay Harris is a 48 y.o. (year old), female patient, seen today for interventional treatment. She  has a past surgical history that includes Colonoscopy with propofol  (N/A, 08/09/2023) and polypectomy (N/A, 08/09/2023). Lindsay Harris has a current medication list which includes the following prescription(s): albuterol , albuterol , vitamin d3, vitamin b-12, cyclobenzaprine, duloxetine , dupixent , orilissa , epinephrine, ergocalciferol , eszopiclone, furosemide, gabapentin , gabapentin , lactulose , linzess , montelukast , ondansetron , pantoprazole , propranolol , propranolol  er, rosuvastatin , simethicone, aerochamber mv, spiriva  handihaler, sumatriptan, symbicort , and gemtesa . Her primarily concern today is the Neck Pain (Right side of neck)  Initial Vital Signs:  Pulse/HCG Rate: 83ECG Heart Rate: 79 Temp: (!) 96.6 F (35.9 C) Resp: 18 BP: 111/66 SpO2: 99 % (3L/[place])  BMI: Estimated body mass index is 45.17 kg/m as calculated from the following:   Height as of this encounter: 5' 3 (1.6 m).   Weight as of this encounter: 255 lb (115.7 kg).  Risk Assessment: Allergies: Reviewed. She is allergic to amoxicillin.  Allergy Precautions: None required Coagulopathies: Reviewed. None identified.  Blood-thinner therapy: None at this  time Active Infection(s): Reviewed. None identified. Lindsay Harris is afebrile  Site Confirmation: Lindsay Harris was asked to confirm the procedure and laterality  before marking the site Procedure checklist: Completed Consent: Before the procedure and under the influence of no sedative(s), amnesic(s), or anxiolytics, the patient was informed of the treatment options, risks and possible complications. To fulfill our ethical and legal obligations, as recommended by the American Medical Association's Code of Ethics, I have informed the patient of my clinical impression; the nature and purpose of the treatment or procedure; the risks, benefits, and possible complications of the intervention; the alternatives, including doing nothing; the risk(s) and benefit(s) of the alternative treatment(s) or procedure(s); and the risk(s) and benefit(s) of doing nothing. The patient was provided information about the general risks and possible complications associated with the procedure. These may include, but are not limited to: failure to achieve desired goals, infection, bleeding, organ or nerve damage, allergic reactions, paralysis, and death. In addition, the patient was informed of those risks and complications associated to Spine-related procedures, such as failure to decrease pain; infection (i.e.: Meningitis, epidural or intraspinal abscess); bleeding (i.e.: epidural hematoma, subarachnoid hemorrhage, or any other type of intraspinal or peri-dural bleeding); organ or nerve damage (i.e.: Any type of peripheral nerve, nerve root, or spinal cord injury) with subsequent damage to sensory, motor, and/or autonomic systems, resulting in permanent pain, numbness, and/or weakness of one or several areas of the body; allergic reactions; (i.e.: anaphylactic reaction); and/or death. Furthermore, the patient was informed of those risks and complications associated with the medications. These include, but are not limited to: allergic  reactions (i.e.: anaphylactic or anaphylactoid reaction(s)); adrenal axis suppression; blood sugar elevation that in diabetics may result in ketoacidosis or comma; water  retention that in patients with history of congestive heart failure may result in shortness of breath, pulmonary edema, and decompensation with resultant heart failure; weight gain; swelling or edema; medication-induced neural toxicity; particulate matter embolism and blood vessel occlusion with resultant organ, and/or nervous system infarction; and/or aseptic necrosis of one or more joints. Finally, the patient was informed that Medicine is not an exact science; therefore, there is also the possibility of unforeseen or unpredictable risks and/or possible complications that may result in a catastrophic outcome. The patient indicated having understood very clearly. We have given the patient no guarantees and we have made no promises. Enough time was given to the patient to ask questions, all of which were answered to the patient's satisfaction. Lindsay Harris has indicated that she wanted to continue with the procedure. Attestation: I, the ordering provider, attest that I have discussed with the patient the benefits, risks, side-effects, alternatives, likelihood of achieving goals, and potential problems during recovery for the procedure that I have provided informed consent. Date  Time: 09/09/2024 12:48 PM  Pre-Procedure Preparation:  Monitoring: As per clinic protocol. Respiration, ETCO2, SpO2, BP, heart rate and rhythm monitor placed and checked for adequate function Safety Precautions: Patient was assessed for positional comfort and pressure points before starting the procedure. Time-out: I initiated and conducted the Time-out before starting the procedure, as per protocol. The patient was asked to participate by confirming the accuracy of the Time Out information. Verification of the correct person, site, and procedure were performed and  confirmed by me, the nursing staff, and the patient. Time-out conducted as per Joint Commission's Universal Protocol (UP.01.01.01). Time: 1320 Start Time: 1320 hrs.  Description  Narrative of Procedure:          Rationale (medical necessity): procedure needed and proper for the diagnosis and/or treatment of the patient's medical symptoms and needs. Start Time: 1320 hrs. Safety Precautions: Aspiration  looking for blood return was conducted prior to all injections. At no point did we inject any substances, as a needle was being advanced. No attempts were made at seeking any paresthesias. Safe injection practices and needle disposal techniques used. Medications properly checked for expiration dates. SDV (single dose vial) medications used. Description of procedure: Protocol guidelines were followed. The patient was assisted into a comfortable position. The target area was identified and the area prepped in the usual manner. Skin & deeper tissues infiltrated with local anesthetic. Appropriate amount of time allowed to pass for local anesthetics to take effect. Using fluoroscopic guidance, the epidural needle was introduced through the skin, ipsilateral to the reported pain, and advanced to the target area. Posterior laminar os was contacted and the needle walked caudad, until the lamina was cleared. The ligamentum flavum was engaged and the epidural space identified using "loss-of-resistance technique" with 2-3 ml of PF-NaCl (0.9% NSS), in a 5cc dedicated LOR syringe. (See Imaging guidance below for use of contrast details.) Once proper needle placement was secured, and negative aspiration confirmed, the solution was injected in intermittent fashion, asking for systemic symptoms every 0.5cc. The needles were then removed and the area cleansed, making sure to leave some of the prepping solution back to take advantage of its long term bactericidal properties.  Vitals:   09/09/24 1319 09/09/24 1325 09/09/24  1335 09/09/24 1400  BP: (!) 96/44 (!) 103/53 (!) 106/33 (!) 79/29  Pulse:      Resp: 18 18 18    Temp:      SpO2: 100% 99% 99%   Weight:      Height:         End Time: 1325 hrs.  Imaging Guidance (Spinal):          Type of Imaging Technique: Fluoroscopy Guidance (Spinal) Indication(s): Fluoroscopy guidance for needle placement to enhance accuracy in procedures requiring precise needle localization for targeted delivery of medication in or near specific anatomical locations not easily accessible without such real-time imaging assistance. Exposure Time: Please see nurses notes. Contrast: Before injecting any contrast, we confirmed that the patient did not have an allergy to iodine, shellfish, or radiological contrast. Once satisfactory needle placement was completed at the desired level, radiological contrast was injected. Contrast injected under live fluoroscopy. No contrast complications. See chart for type and volume of contrast used. Fluoroscopic Guidance: I was personally present during the use of fluoroscopy. Tunnel Vision Technique used to obtain the best possible view of the target area. Parallax error corrected before commencing the procedure. Direction-depth-direction technique used to introduce the needle under continuous pulsed fluoroscopy. Once target was reached, antero-posterior, oblique, and lateral fluoroscopic projection used confirm needle placement in all planes. Images permanently stored in EMR. Interpretation: I personally interpreted the imaging intraoperatively. Adequate needle placement confirmed in multiple planes. Appropriate spread of contrast into desired area was observed. No evidence of afferent or efferent intravascular uptake. No intrathecal or subarachnoid spread observed. Permanent images saved into the patient's record.  Post-operative Assessment:  Post-procedure Vital Signs:  Pulse/HCG Rate: 8374 Temp: (!) 96.6 F (35.9 C) Resp: 18 BP: (!) 79/29  (asymptomatic.  Dr Tanya notified.  [employer].) SpO2: 99 %  EBL: None  Complications: No immediate post-treatment complications observed by team, or reported by patient.  Note: The patient tolerated the entire procedure well. A repeat set of vitals were taken after the procedure and the patient was kept under observation following institutional policy, for this type of procedure. Post-procedural neurological assessment was performed,  showing return to baseline, prior to discharge. The patient was provided with post-procedure discharge instructions, including a section on how to identify potential problems. Should any problems arise concerning this procedure, the patient was given instructions to immediately contact us , at any time, without hesitation. In any case, we plan to contact the patient by telephone for a follow-up status report regarding this interventional procedure.  Comments:  No additional relevant information.  Plan of Care (POC)  Orders:  Orders Placed This Encounter  Procedures   Cervical Epidural Injection    Indication(s): Radiculitis and cervicalgia associated with cervical degenerative disc disease. Position: Prone Imaging guidance: Fluoroscopy required. Contrast required unless contraindicated by allergy or severe CKD. Equipment & Materials: Epidural tray & needle.    Scheduling Instructions:     Procedure: Cervical Epidural Steroid Injection/Block     Planned Level(s): C7-T1     Laterality: Right-sided     Anxiolysis: Patient's choice.     Date: 09/09/2024    Where will this procedure be performed?:   [facility]             by Dr. Tanya [facility] 1-60 MIN NO REPORT    Intraoperative interpretation by procedural physician at [facility].    Standing Status:   Standing    Number of Occurrences:   1    Reason for exam::   Assistance in needle guidance and placement for procedures requiring needle placement in or near specific  anatomical locations not easily accessible without such assistance.   Informed Consent Details: Physician/Practitioner Attestation; Transcribe to consent form and obtain patient signature    Nursing instructions: Transcribe to consent form and obtain patient signature. Always confirm laterality of pain with Lindsay Harris, before procedure.    Physician/Practitioner attestation of informed consent for procedure/surgical case:   I, the physician/practitioner, attest that I have discussed with the patient the benefits, risks, side effects, alternatives, likelihood of achieving goals and potential problems during recovery for the procedure that I have provided informed consent.    Procedure:   Cervical Epidural Steroid Injection (CESI) under fluoroscopic guidance    Physician/Practitioner performing the procedure:   Kellianne Ek A. Tanya MD    Indication/Reason:   Indications: Cervicalgia (neck pain), cervical radicular pain, radiculitis (arm/shoulder pain, numbness, and/or weakness), degenerative disc disease, severe enough to greatly impact quality of life or function.   Provide equipment / supplies at bedside    Procedural tray: Epidural Tray (Disposable  single use) Skin infiltration needle: Regular 1.5-in, 25-G, (x1) Block needle size: Regular standard Catheter: No catheter required    Standing Status:   Standing    Number of Occurrences:   1    Specify:   Epidural Tray   Saline lock IV    Have LR [phone] mL available and administer at 125 mL/hr if patient becomes hypotensive.    Standing Status:   Standing    Number of Occurrences:   1     Opioid Analgesic: No chronic opioid analgesics therapy prescribed by our practice. None MME/day: 0 mg/day    Medications ordered for procedure: Meds ordered this encounter  Medications   iohexol  (OMNIPAQUE ) 180 MG/ML injection 10 mL    Must be Myelogram-compatible. If not available, you may substitute with a water -soluble, non-ionic, hypoallergenic,  myelogram-compatible radiological contrast medium.   midazolam  (VERSED ) injection 0.5-2 mg    Make sure Flumazenil is available in the pyxis when using this medication. If oversedation occurs, administer 0.2  mg IV over 15 sec. If after 45 sec no response, administer 0.2 mg again over 1 min; may repeat at 1 min intervals; not to exceed 4 doses (1 mg)   sodium chloride  flush (NS) 0.9 % injection 1 mL   ropivacaine  (PF) 2 mg/mL (0.2%) (NAROPIN ) injection 1 mL   dexamethasone  (DECADRON ) injection 10 mg   lidocaine  (XYLOCAINE ) 2 % (with pres) injection 400 mg   pentafluoroprop-tetrafluoroeth (GEBAUERS) aerosol   Medications administered: We administered iohexol , midazolam , sodium chloride  flush, ropivacaine  (PF) 2 mg/mL (0.2%), dexamethasone , lidocaine , and pentafluoroprop-tetrafluoroeth.  See the medical record for exact dosing, route, and time of administration.    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 x1 by Debby Amber, PA (11/30/2023) [facility])  Therapeutic left greater trochanteric bursa inj. x1 by Debby Amber, PA (03/26/2024) ([facility])   1(Analgesic benefit): Expressed in percentage (%). (Local anesthetic[LA] +/- sedation  L.A.Local Anesthetic  Steroid benefit  Ongoing benefit)      Follow-up plan:   Return in about 2 weeks (around 09/23/2024) for (Face2F), (PPE).     Recent Visits Date Type Provider Dept  08/20/24 Office Visit Tanya Glisson, MD [facility]  07/30/24 Office Visit Tanya Glisson, MD [facility]  Showing recent visits within past 90 days and meeting all other requirements Today's Visits Date Type Provider Dept  09/09/24 Procedure visit Tanya Glisson, MD [facility]  Showing today's visits and meeting all other requirements Future Appointments Date Type Provider Dept  09/29/24 Appointment Tanya Glisson, MD [facility]  Showing future appointments within next 90 days and meeting all other requirements   Disposition: Discharge home  Discharge (Date  Time): 09/09/2024; 1418 (patient bp has increased.  asymptomatic.  OK to dc per Dr Tanya.) hrs.   Primary Care Physician: Kayla Jeoffrey RAMAN, FNP Location: [facility] Note by: Glisson DELENA Tanya, MD (TTS technology used. I apologize for any typographical errors that were not detected and corrected.) Date: 09/09/2024; Time: 2:43 PM  Disclaimer:  Medicine is not an Visual merchandiser. The only guarantee in medicine is that nothing is guaranteed. It is important to note that the decision to proceed with this intervention was based on the information collected from the patient. The Data and conclusions were drawn from the patient's questionnaire, the interview, and the physical examination. Because the information was provided in large part by the patient, it cannot be guaranteed that it has not been purposely or unconsciously manipulated. Every effort has been made to obtain as much relevant data as possible for this evaluation. It is important to note that the conclusions that lead to this procedure are derived in large part from the available data. Always take into account that the treatment will also be dependent on availability of resources and existing treatment guidelines, considered by other Pain Management Practitioners as being common knowledge and practice, at the time of the intervention. For  Medico-Legal purposes, it is also important to point out that variation in procedural techniques and pharmacological choices are the acceptable norm. The indications, contraindications, technique, and results of the above procedure should only be interpreted and judged by a Board-Certified Interventional Pain Specialist with extensive familiarity and expertise in the same exact procedure and technique.

## 2024-09-10 ENCOUNTER — Telehealth: Payer: Self-pay

## 2024-09-10 NOTE — Telephone Encounter (Signed)
 Post procedure follow up.  Patient states she is doing good.

## 2024-09-11 ENCOUNTER — Other Ambulatory Visit: Payer: Self-pay | Admitting: Cardiovascular Disease

## 2024-09-13 ENCOUNTER — Other Ambulatory Visit: Payer: Self-pay | Admitting: Family Medicine

## 2024-09-17 ENCOUNTER — Ambulatory Visit: Admitting: Podiatry

## 2024-09-22 ENCOUNTER — Other Ambulatory Visit (HOSPITAL_COMMUNITY): Payer: Self-pay

## 2024-09-24 ENCOUNTER — Ambulatory Visit

## 2024-09-24 ENCOUNTER — Ambulatory Visit (INDEPENDENT_AMBULATORY_CARE_PROVIDER_SITE_OTHER): Admitting: Podiatry

## 2024-09-24 VITALS — Ht 63.0 in | Wt 255.0 lb

## 2024-09-24 DIAGNOSIS — M722 Plantar fascial fibromatosis: Secondary | ICD-10-CM | POA: Diagnosis not present

## 2024-09-24 DIAGNOSIS — M7752 Other enthesopathy of left foot: Secondary | ICD-10-CM | POA: Diagnosis not present

## 2024-09-24 DIAGNOSIS — M7751 Other enthesopathy of right foot: Secondary | ICD-10-CM

## 2024-09-24 HISTORY — DX: Plantar fascial fibromatosis: M72.2

## 2024-09-24 NOTE — Patient Instructions (Addendum)
 VISIT SUMMARY: Today, we discussed your ongoing bilateral foot pain, which has been severe and constant since the beginning of the year. We reviewed your history of chronic lower extremity pain and recent cervical neck injections, which did not alleviate your symptoms. We also discussed your concerns about maintaining blood sugar levels to avoid diabetes.  YOUR PLAN: -BILATERAL PLANTAR FASCIITIS WITH HEEL SPUR: Plantar fasciitis is inflammation of the tissue along the bottom of your foot, often causing heel pain. Your radiographs confirmed a heel spur on the left foot with calcifications within the plantar fascia. We will start physical therapy exercises for plantar fasciitis, and you will receive a sheet of exercises to do at home. A steroid injection was administered into your heel, but please be aware it may increase your blood sugar levels. We will reassess your progress in six weeks, and if there is no improvement, we may order an MRI to further evaluate the inflammation.  -BILATERAL FOOT PAIN: Your chronic bilateral foot pain, described as feeling like stepping on nails, is likely related to your plantar fasciitis. We will address this pain through the treatment plan for plantar fasciitis. If your symptoms persist, we will monitor for potential nerve-related issues such as peripheral neuropathy or sciatica.  INSTRUCTIONS: Please follow the physical therapy exercises provided and monitor your blood sugar levels closely, especially after the steroid injection. We will reassess your condition in six weeks. If there is no improvement, we may need to order an MRI of your foot. If you have any concerns or if your symptoms worsen, please contact our office.             Plantar Fasciitis (Heel Spur Syndrome) with Rehab The plantar fascia is a fibrous, ligament-like, soft-tissue structure that spans the bottom of the foot. Plantar fasciitis is a condition that causes pain in the foot due to  inflammation of the tissue. SYMPTOMS  Pain and tenderness on the underneath side of the foot. Pain that worsens with standing or walking. CAUSES  Plantar fasciitis is caused by irritation and injury to the plantar fascia on the underneath side of the foot. Common mechanisms of injury include: Direct trauma to bottom of the foot. Damage to a small nerve that runs under the foot where the main fascia attaches to the heel bone. Stress placed on the plantar fascia due to bone spurs. RISK INCREASES WITH:  Activities that place stress on the plantar fascia (running, jumping, pivoting, or cutting). Poor strength and flexibility. Improperly fitted shoes. Tight calf muscles. Flat feet. Failure to warm-up properly before activity. Obesity. PREVENTION Warm up and stretch properly before activity. Allow for adequate recovery between workouts. Maintain physical fitness: Strength, flexibility, and endurance. Cardiovascular fitness. Maintain a health body weight. Avoid stress on the plantar fascia. Wear properly fitted shoes, including arch supports for individuals who have flat feet.  PROGNOSIS  If treated properly, then the symptoms of plantar fasciitis usually resolve without surgery. However, occasionally surgery is necessary.  RELATED COMPLICATIONS  Recurrent symptoms that may result in a chronic condition. Problems of the lower back that are caused by compensating for the injury, such as limping. Pain or weakness of the foot during push-off following surgery. Chronic inflammation, scarring, and partial or complete fascia tear, occurring more often from repeated injections.  TREATMENT  Treatment initially involves the use of ice and medication to help reduce pain and inflammation. The use of strengthening and stretching exercises may help reduce pain with activity, especially stretches of the Achilles tendon.  These exercises may be performed at home or with a therapist. Your caregiver may  recommend that you use heel cups of arch supports to help reduce stress on the plantar fascia. Occasionally, corticosteroid injections are given to reduce inflammation. If symptoms persist for greater than 6 months despite non-surgical (conservative), then surgery may be recommended.   MEDICATION  If pain medication is necessary, then nonsteroidal anti-inflammatory medications, such as aspirin and ibuprofen, or other minor pain relievers, such as acetaminophen , are often recommended. Do not take pain medication within 7 days before surgery. Prescription pain relievers may be given if deemed necessary by your caregiver. Use only as directed and only as much as you need. Corticosteroid injections may be given by your caregiver. These injections should be reserved for the most serious cases, because they may only be given a certain number of times.  HEAT AND COLD Cold treatment (icing) relieves pain and reduces inflammation. Cold treatment should be applied for 10 to 15 minutes every 2 to 3 hours for inflammation and pain and immediately after any activity that aggravates your symptoms. Use ice packs or massage the area with a piece of ice (ice massage). Heat treatment may be used prior to performing the stretching and strengthening activities prescribed by your caregiver, physical therapist, or athletic trainer. Use a heat pack or soak the injury in warm water .  SEEK IMMEDIATE MEDICAL CARE IF: Treatment seems to offer no benefit, or the condition worsens. Any medications produce adverse side effects.  EXERCISES- RANGE OF MOTION (ROM) AND STRETCHING EXERCISES - Plantar Fasciitis (Heel Spur Syndrome) These exercises may help you when beginning to rehabilitate your injury. Your symptoms may resolve with or without further involvement from your physician, physical therapist or athletic trainer. While completing these exercises, remember:  Restoring tissue flexibility helps normal motion to return to the  joints. This allows healthier, less painful movement and activity. An effective stretch should be held for at least 30 seconds. A stretch should never be painful. You should only feel a gentle lengthening or release in the stretched tissue.  RANGE OF MOTION - Toe Extension, Flexion Sit with your right / left leg crossed over your opposite knee. Grasp your toes and gently pull them back toward the top of your foot. You should feel a stretch on the bottom of your toes and/or foot. Hold this stretch for 10 seconds. Now, gently pull your toes toward the bottom of your foot. You should feel a stretch on the top of your toes and or foot. Hold this stretch for 10 seconds. Repeat  times. Complete this stretch 3 times per day.   RANGE OF MOTION - Ankle Dorsiflexion, Active Assisted Remove shoes and sit on a chair that is preferably not on a carpeted surface. Place right / left foot under knee. Extend your opposite leg for support. Keeping your heel down, slide your right / left foot back toward the chair until you feel a stretch at your ankle or calf. If you do not feel a stretch, slide your bottom forward to the edge of the chair, while still keeping your heel down. Hold this stretch for 10 seconds. Repeat 3 times. Complete this stretch 2 times per day.   STRETCH  Gastroc, Standing Place hands on wall. Extend right / left leg, keeping the front knee somewhat bent. Slightly point your toes inward on your back foot. Keeping your right / left heel on the floor and your knee straight, shift your weight toward the wall, not  allowing your back to arch. You should feel a gentle stretch in the right / left calf. Hold this position for 10 seconds. Repeat 3 times. Complete this stretch 2 times per day.  STRETCH  Soleus, Standing Place hands on wall. Extend right / left leg, keeping the other knee somewhat bent. Slightly point your toes inward on your back foot. Keep your right / left heel on the floor,  bend your back knee, and slightly shift your weight over the back leg so that you feel a gentle stretch deep in your back calf. Hold this position for 10 seconds. Repeat 3 times. Complete this stretch 2 times per day.  STRETCH  Gastrocsoleus, Standing  Note: This exercise can place a lot of stress on your foot and ankle. Please complete this exercise only if specifically instructed by your caregiver.  Place the ball of your right / left foot on a step, keeping your other foot firmly on the same step. Hold on to the wall or a rail for balance. Slowly lift your other foot, allowing your body weight to press your heel down over the edge of the step. You should feel a stretch in your right / left calf. Hold this position for 10 seconds. Repeat this exercise with a slight bend in your right / left knee. Repeat 3 times. Complete this stretch 2 times per day.   STRENGTHENING EXERCISES - Plantar Fasciitis (Heel Spur Syndrome)  These exercises may help you when beginning to rehabilitate your injury. They may resolve your symptoms with or without further involvement from your physician, physical therapist or athletic trainer. While completing these exercises, remember:  Muscles can gain both the endurance and the strength needed for everyday activities through controlled exercises. Complete these exercises as instructed by your physician, physical therapist or athletic trainer. Progress the resistance and repetitions only as guided.  STRENGTH - Towel Curls Sit in a chair positioned on a non-carpeted surface. Place your foot on a towel, keeping your heel on the floor. Pull the towel toward your heel by only curling your toes. Keep your heel on the floor. Repeat 3 times. Complete this exercise 2 times per day.  STRENGTH - Ankle Inversion Secure one end of a rubber exercise band/tubing to a fixed object (table, pole). Loop the other end around your foot just before your toes. Place your fists between  your knees. This will focus your strengthening at your ankle. Slowly, pull your big toe up and in, making sure the band/tubing is positioned to resist the entire motion. Hold this position for 10 seconds. Have your muscles resist the band/tubing as it slowly pulls your foot back to the starting position. Repeat 3 times. Complete this exercises 2 times per day.  Document Released: 12/04/2005 Document Revised: 02/26/2012 Document Reviewed: 03/18/2009 [facility] Patient Information 2014 [place], [place].

## 2024-09-25 ENCOUNTER — Other Ambulatory Visit

## 2024-09-25 ENCOUNTER — Encounter: Payer: Self-pay | Admitting: Podiatry

## 2024-09-25 DIAGNOSIS — E118 Type 2 diabetes mellitus with unspecified complications: Secondary | ICD-10-CM

## 2024-09-25 DIAGNOSIS — R002 Palpitations: Secondary | ICD-10-CM

## 2024-09-25 DIAGNOSIS — E662 Morbid (severe) obesity with alveolar hypoventilation: Secondary | ICD-10-CM

## 2024-09-25 DIAGNOSIS — E7800 Pure hypercholesterolemia, unspecified: Secondary | ICD-10-CM

## 2024-09-25 NOTE — Progress Notes (Addendum)
 Subjective:  Patient ID: Lindsay Harris, female    DOB: 1976-06-08,  MRN: [id number]  Chief Complaint  Patient presents with   Foot Pain    Rm 4 Patient states bilateral foot pain. Burning and stepping on nails sensation on the dorsal/plantar aspect of feet after prolonged walking/standing. Patient is diabetic (A1C 7.1).     Discussed the use of AI scribe software for clinical note transcription with the patient, who gave verbal consent to proceed.  History of Present Illness Lindsay Harris is a 48 year old female who presents with bilateral foot pain. She has been under the care of a pain management specialist, Dr. Rexann.  She has been experiencing severe pain in both feet since the beginning of the year, described as 'stepping on nails' when on her feet for more than a few minutes. The pain is located all over the bottoms of her feet and is accompanied by sore spots. It is constant, worsens with prolonged standing, and affects her ability to work. The pain is present all the time, not just in the morning.  She has a history of chronic lower extremity pain, low back pain, and high back pain. She recently received cervical neck injections in September, which did not alleviate her pain. She is meticulous about monitoring her blood sugar levels, checking them four times a day, and is concerned about maintaining control to avoid diabetes.  She has been on bed rest and home stay, which she attributes to weight gain and increased need for steroids due to pulmonary issues. Reports constant pain, not worse in the morning.      Objective:    Physical Exam VASCULAR: DP and PT pulse palpable. Foot is warm and well-perfused. Capillary fill time is brisk. DERMATOLOGIC: Normal skin turgor, texture, and temperature. No open lesions, rashes, or ulcerations. NEUROLOGIC: Altered distal sensation to light touch and pressure. No paresthesias. ORTHOPEDIC: Diffuse tenderness on the bottom of the foot  bilaterally midfoot and forefoot is relatively nonspecific but she does have Sharp pain to palpation at the medial insertion of the plantar fascia bilaterally. Smooth pain-free range of motion of all examined joints. No ecchymosis or bruising. No gross deformity.   No images are attached to the encounter.    Results LABS A1c: 6.8 (07/24/2024)  RADIOLOGY Foot X-ray: No stress fracture or fracture. Calcaneal spur on the left with calcifications within the plantar fascia. (09/24/2024)   Assessment:   1. Plantar fasciitis, bilateral      Plan:  Patient was evaluated and treated and all questions answered.  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 Symptoms include diffuse tenderness on the bottom of the foot and sharp pain at the medial insertion of the plantar fascia. Differential diagnosis includes nerve issues such as peripheral neuropathy or chronic pain secondary to spinal pathology. - Initiate physical therapy exercises for plantar fasciitis.  Home physical therapy plan I recommend that she does exercises twice a day daily until her next visit with me in 6 weeks. - Provide a sheet of exercises to start at home. - Administer steroid injection into the heel, with discussion of potential for increased blood sugar levels. - Consider referral to home physical therapy if available through her home care agency. - Reassess in six weeks to evaluate progress. - If no improvement, consider ordering MRI of the  foot to assess plantar fascia inflammation.  Bilateral foot pain Chronic bilateral foot pain, described as feeling like stepping on nails, with pain exacerbated by prolonged standing. Pain is present all over the bottom of the feet. Differential diagnosis includes plantar fasciitis and  nerve-related issues such as peripheral neuropathy or sciatica. Recent cervical neck injections did not alleviate symptoms. - Address bilateral foot pain through treatment of plantar fasciitis. - Monitor for potential nerve-related issues if symptoms persist.    After sterile prep with povidone-iodine solution and alcohol, the bilateral heel was injected with 0.5cc 2% xylocaine  plain, 0.5cc 0.5% marcaine plain, 20 mg triamcinolone  acetonide, and 4 mg dexamethasone  was injected along the medial plantar fascia at the insertion on the plantar calcaneus. The patient tolerated the procedure well without complication.   Return in about 6 weeks (around 11/05/2024) for recheck plantar fasciitis.

## 2024-09-26 ENCOUNTER — Other Ambulatory Visit: Payer: Self-pay | Admitting: Pulmonary Disease

## 2024-09-26 ENCOUNTER — Other Ambulatory Visit (HOSPITAL_COMMUNITY): Payer: Self-pay

## 2024-09-26 ENCOUNTER — Other Ambulatory Visit: Payer: Self-pay

## 2024-09-26 DIAGNOSIS — J4489 Other specified chronic obstructive pulmonary disease: Secondary | ICD-10-CM

## 2024-09-26 LAB — COMPLETE METABOLIC PANEL WITHOUT GFR
AG Ratio: 1.3 (calc) (ref 1.0–2.5)
ALT: 81 U/L — ABNORMAL HIGH (ref 6–29)
AST: 42 U/L — ABNORMAL HIGH (ref 10–35)
Albumin: 4.2 g/dL (ref 3.6–5.1)
Alkaline phosphatase (APISO): 94 U/L (ref 31–125)
BUN: 8 mg/dL (ref 7–25)
CO2: 26 mmol/L (ref 20–32)
Calcium: 9.6 mg/dL (ref 8.6–10.2)
Chloride: 106 mmol/L (ref 98–110)
Creat: 0.82 mg/dL (ref 0.50–0.99)
Globulin: 3.2 g/dL (ref 1.9–3.7)
Glucose, Bld: 158 mg/dL — ABNORMAL HIGH (ref 65–99)
Potassium: 4.1 mmol/L (ref 3.5–5.3)
Sodium: 141 mmol/L (ref 135–146)
Total Bilirubin: 0.3 mg/dL (ref 0.2–1.2)
Total Protein: 7.4 g/dL (ref 6.1–8.1)

## 2024-09-26 NOTE — Progress Notes (Signed)
 Specialty Pharmacy Refill Coordination Note  Lindsay Harris is a 48 y.o. female contacted today regarding refills of specialty medication(s) Dupilumab  (Dupixent )   Patient requested Delivery   Delivery date: 09/30/24   Verified address: [address]  [place] [address]   Medication will be filled on 09/29/24.

## 2024-09-28 NOTE — Progress Notes (Unsigned)
 PROVIDER NOTE: Interpretation of information contained herein should be left to medically-trained personnel. Specific patient instructions are provided elsewhere under Patient Instructions section of medical record. This document was created in part using AI and STT-dictation technology, any transcriptional errors that may result from this process are unintentional.  Patient: Lindsay Harris  Service: E/M   PCP: Kayla Jeoffrey RAMAN, FNP  DOB: [birth year]  DOS: 09/29/2024  Provider: Eric DELENA Como, MD  MRN: [id number]  Delivery: Face-to-face  Specialty: Interventional Pain Management  Type: Established Patient  Setting: Ambulatory outpatient facility  Specialty designation: 09  Referring Prov.: Kayla Jeoffrey RAMAN, FNP  Location: Outpatient office facility       History of present illness (HPI) Lindsay Harris, a 48 y.o. year old female, is here today because of her Cervicalgia [M54.2]. Lindsay Harris primary complain today is No chief complaint on file.  Pertinent problems: Lindsay Harris has Cervical neck pain with evidence of disc disease; Disc degeneration, lumbar; Migraines; Chronic pain syndrome; Chronic low back pain (Midline) (1ry area of Pain) w/ sciatica (Right); Abnormal MRI, lumbar spine (11/12/2023) ([facility]); Chronic lower extremity pain (Right); Cervicalgia; Chronic neck pain (Bilateral); Chronic shoulder pain (Bilateral) (R>L); Chronic lower extremity pain (Right); Cervical radiculitis (Right); Chronic low back pain (Bilateral) w/o sciatica; Low back pain of over 3 months duration; Low back pain radiating to both legs; Multifactorial low back pain; Chronic hip pain (Bilateral); Chronic lower extremity pain (Bilateral); Neck pain of over 3 months duration; Neck and shoulder pain; Posterior neck pain; Chronic neck and back pain; and DDD (degenerative disc disease), cervical on their pertinent problem list.  Pain Assessment: Severity of   is reported as a  /10. Location:    / . Onset:  . Quality:   . Timing:  . Modifying factor(s):  SABRA Vitals:  vitals were not taken for this visit.  BMI: Estimated body mass index is 45.17 kg/m as calculated from the following:   Height as of 09/24/24: 5' 3 (1.6 m).   Weight as of 09/24/24: 255 lb (115.7 kg).  Last encounter: 08/20/2024. Last procedure: 09/09/2024.  Reason for encounter: post-procedure evaluation and assessment.   Discussed the use of AI scribe software for clinical note transcription with the patient, who gave verbal consent to proceed.  History of Present Illness          Post-Procedure Evaluation   Type: Cervical Epidural Steroid injection (CESI) (Interlaminar) #1  Laterality: Right (-RT)  Level: C7-T1 DOS: 09/09/2024  Provider: Eric DELENA Como, MD Imaging: Fluoroscopy-guided Spinal ([id number]) Anesthesia: Local anesthesia (1-2% Lidocaine ) Anxiolysis: IV Versed  2.0 mg Sedation: No Sedation                        Medical Necessity Purpose: Diagnostic/Therapeutic Rationale (medical necessity): procedure needed and proper for the diagnosis and/or treatment of Lindsay Harris's medical symptoms and needs. Indications: Cervicalgia, cervical radicular pain, degenerative disc disease, severe enough to impact quality of life or function. 1. Chronic neck pain (Bilateral)   2. Cervicalgia   3. Cervical radiculitis (Right)   4. Cervical neck pain with evidence of disc disease   5. Chronic shoulder pain (Bilateral) (R>L)   6. Neck pain of over 3 months duration   7. Neck and shoulder pain   8. Posterior neck pain   9. Chronic neck and back pain   10. DDD (degenerative disc disease), cervical    NAS-11 Pain score:   Pre-procedure: 8 /10   Post-procedure: 0-No  pain/10     Effectiveness:  Initial hour after procedure:   ***. Subsequent 4-6 hours post-procedure:   ***. Analgesia past initial 6 hours:   ***. Ongoing improvement:  Analgesic:  *** Function:    ***    ROM:    ***     Pharmacotherapy Assessment   Opioid  Analgesic: No chronic opioid analgesics therapy prescribed by our practice. None MME/day: 0 mg/day   Monitoring: [place] PMP: PDMP reviewed during this encounter.       Pharmacotherapy: No side-effects or adverse reactions reported. Compliance: No problems identified. Effectiveness: Clinically acceptable.  No notes on file  UDS:  Summary  Date Value Ref Range Status  07/30/2024 FINAL  Final    Comment:    ==================================================================== Compliance Drug Analysis, Ur ==================================================================== Test                             Result       Flag       Units  Drug Present   Carboxy-THC                    81                      ng/mg creat    Carboxy-THC is a metabolite of tetrahydrocannabinol (THC). Source of    THC is most commonly herbal marijuana or marijuana-based products,    but THC is also present in a scheduled prescription medication.    Trace amounts of THC can be present in hemp and cannabidiol (CBD)    products. This test is not intended to distinguish between delta-9-    tetrahydrocannabinol, the predominant form of THC in most herbal or    marijuana-based products, and delta-8-tetrahydrocannabinol.    Gabapentin                      PRESENT   Cyclobenzaprine                PRESENT   Desmethylcyclobenzaprine       PRESENT    Desmethylcyclobenzaprine is an expected metabolite of    cyclobenzaprine.    Duloxetine                      PRESENT   Propranolol                     PRESENT ==================================================================== Test                      Result    Flag   Units      Ref Range   Creatinine              187              mg/dL      >=79 ==================================================================== Declared Medications:  Medication list was not provided. ==================================================================== For clinical consultation,  please call [phone]. ====================================================================     No results found for: CBDTHCR No results found for: D8THCCBX No results found for: D9THCCBX  ROS  Constitutional: Denies any fever or chills Gastrointestinal: No reported hemesis, hematochezia, vomiting, or acute GI distress Musculoskeletal: Denies any acute onset joint swelling, redness, loss of ROM, or weakness Neurological: No reported episodes of acute onset apraxia, aphasia, dysarthria, agnosia, amnesia, paralysis, loss of coordination, or loss of consciousness  Medication Review  AeroChamber MV, DULoxetine ,  Dupilumab , EPINEPHrine, Elagolix Sodium , SUMAtriptan, Simethicone, Tiotropium Bromide , Vibegron , Vitamin D3, albuterol , budesonide -formoterol , cyclobenzaprine, ergocalciferol , eszopiclone, furosemide, gabapentin , lactulose , linaclotide , montelukast , ondansetron , pantoprazole , propranolol , propranolol  ER, and rosuvastatin   History Review  Allergy: Lindsay Harris is allergic to amoxicillin. Drug: Lindsay Harris  reports no history of drug use. Alcohol:  reports no history of alcohol use. Tobacco:  reports that she has been smoking cigarettes. She started smoking about 38 years ago. She has a 38.8 pack-year smoking history. She does not have any smokeless tobacco history on file. Social: Lindsay Harris  reports that she has been smoking cigarettes. She started smoking about 38 years ago. She has a 38.8 pack-year smoking history. She does not have any smokeless tobacco history on file. She reports that she does not drink alcohol and does not use drugs. Medical:  has a past medical history of Asthma (03/1987), COPD (chronic obstructive pulmonary disease) (HCC), Dyspnea, GERD (gastroesophageal reflux disease), Headache, and Thyroid disease ([id number]). Surgical: Lindsay Harris  has a past surgical history that includes Colonoscopy with propofol  (N/A, 08/09/2023) and polypectomy (N/A,  08/09/2023). Family: family history includes Anemia in her mother; Asthma in her maternal grandfather and mother; Breast cancer in her maternal grandmother; Colon cancer in her maternal uncle; Colon cancer (age of onset: 64) in her mother; Heart attack in her maternal grandfather; Heart disease in her maternal grandfather; Heart failure in her maternal grandfather; Hypertension in her maternal grandfather; Ovarian cancer (age of onset: 11) in her sister.  Laboratory Chemistry Profile   Renal Lab Results  Component Value Date   BUN 8 09/25/2024   CREATININE 0.82 09/25/2024   LABCREA 116 08/05/2024   BCR SEE NOTE: 09/25/2024   GFRAA >60 12/30/2015   GFRNONAA >60 05/08/2024    Hepatic Lab Results  Component Value Date   AST 42 (H) 09/25/2024   ALT 81 (H) 09/25/2024   ALBUMIN 3.8 (L) 07/30/2024   ALKPHOS 114 07/30/2024   LIPASE 38 06/07/2023    Electrolytes Lab Results  Component Value Date   NA 141 09/25/2024   K 4.1 09/25/2024   CL 106 09/25/2024   CALCIUM  9.6 09/25/2024   MG 2.1 07/30/2024    Bone Lab Results  Component Value Date   VD25OH 26 (L) 07/24/2024   25OHVITD1 20 (L) 07/30/2024   25OHVITD2 <1.0 07/30/2024   25OHVITD3 20 07/30/2024    Inflammation (CRP: Acute Phase) (ESR: Chronic Phase) Lab Results  Component Value Date   CRP 12 (H) 07/30/2024   ESRSEDRATE 44 (H) 07/30/2024   LATICACIDVEN 1.4 08/23/2022         Note: Above Lab results reviewed.  Recent Imaging Review  DG Foot Complete Left Please see detailed radiograph report in office note. DG Foot Complete Right Please see detailed radiograph report in office note. Note: Reviewed        Physical Exam  Vitals: There were no vitals taken for this visit. BMI: Estimated body mass index is 45.17 kg/m as calculated from the following:   Height as of 09/24/24: 5' 3 (1.6 m).   Weight as of 09/24/24: 255 lb (115.7 kg). Ideal: Ideal body weight: 52.4 kg (115 lb 8.3 oz) Adjusted ideal body weight: 77.7 kg  (171 lb 5 oz) General appearance: Well nourished, well developed, and well hydrated. In no apparent acute distress Mental status: Alert, oriented x 3 (person, place, & time)       Respiratory: No evidence of acute respiratory distress Eyes: PERLA   Assessment   Diagnosis Status  1.  Cervicalgia   2. Chronic neck pain (Bilateral)   3. Chronic neck and back pain   4. Chronic shoulder pain (Bilateral) (R>L)   5. Neck and shoulder pain   6. Neck pain of over 3 months duration   7. Posterior neck pain   8. Postop check    Controlled Controlled Controlled   Updated Problems: No problems updated.  Plan of Care  Problem-specific:  Assessment and Plan            Lindsay Harris has a current medication list which includes the following long-term medication(s): albuterol , albuterol , duloxetine , dupixent , eszopiclone, furosemide, linzess , montelukast , pantoprazole , propranolol , propranolol  er, rosuvastatin , simethicone, spiriva  handihaler, sumatriptan, and symbicort .  Pharmacotherapy (Medications Ordered): No orders of the defined types were placed in this encounter.  Orders:  No orders of the defined types were placed in this encounter.    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 x1 by Debby Amber, PA (11/30/2023) [facility])  Therapeutic left greater trochanteric bursa inj. x1 by Debby Amber, PA (03/26/2024) ([facility])   1(Analgesic benefit): Expressed in  percentage (%). (Local anesthetic[LA] +/- sedation  L.A.Local Anesthetic  Steroid benefit  Ongoing benefit)     No follow-ups on file.    Recent Visits Date Type Provider Dept  09/09/24 Procedure visit Tanya Glisson, MD [facility]  08/20/24 Office Visit Tanya Glisson, MD [facility]  07/30/24 Office Visit Tanya Glisson, MD [facility]  Showing recent visits within past 90 days and meeting all other requirements Future Appointments Date Type Provider Dept  09/29/24 Appointment Tanya Glisson, MD [facility]  Showing future appointments within next 90 days and meeting all other requirements  I discussed the assessment and treatment plan with the patient. The patient was provided an opportunity to ask questions and all were answered. The patient agreed with the plan and demonstrated an understanding of the instructions.  Patient advised to call back or seek an in-person evaluation if the symptoms or condition worsens.  Duration of encounter: *** minutes.  Total time on encounter, as per AMA guidelines included both the face-to-face and non-face-to-face time personally spent by the physician and/or other qualified health care professional(s) on the day of the encounter (includes time in activities that require the physician or other qualified health care professional and does not include time in activities normally performed by clinical staff). Physician's time may include the following activities when performed: Preparing to see the patient (e.g., pre-charting review of records, searching for previously ordered imaging, lab work, and nerve conduction tests) Review of prior analgesic pharmacotherapies. Reviewing PMP Interpreting ordered tests (e.g., lab work, imaging, nerve conduction tests) Performing post-procedure evaluations, including interpretation of diagnostic procedures Obtaining and/or reviewing separately obtained  history Performing a medically appropriate examination and/or evaluation Counseling and educating the patient/family/caregiver Ordering medications, tests, or procedures Referring and communicating with other health care professionals (when not separately reported) Documenting clinical  information in the electronic or other health record Independently interpreting results (not separately reported) and communicating results to the patient/ family/caregiver Care coordination (not separately reported)  Note by: Eric DELENA Como, MD (TTS and AI technology used. I apologize for any typographical errors that were not detected and corrected.) Date: 09/29/2024; Time: 2:37 PM

## 2024-09-28 NOTE — Patient Instructions (Signed)
 ______________________________________________________________________    Procedure instructions  Stop blood-thinners  Do not eat or drink fluids (other than water ) for 6 hours before your procedure  No water  for 2 hours before your procedure  Take your blood pressure medicine with a sip of water   Arrive 30 minutes before your appointment  If sedation is planned, bring suitable driver. [place], [place], & public transportation are NOT APPROVED)  Carefully read the Preparing for your procedure detailed instructions  If you have questions call us  at [phone]  Procedure appointments are for procedures only.   NO medication refills or new problem evaluations will be done on procedure days.   Only the scheduled, pre-approved procedure and side will be done.   ______________________________________________________________________     ______________________________________________________________________    Preparing for your procedure  Appointments: If you think you may not be able to keep your appointment, call 24-48 hours in advance to cancel. We need time to make it available to others.  Procedure visits are for procedures only. During your procedure appointment there will be: NO Prescription Refills*. NO medication changes or discussions*. NO discussion of disability issues*. NO unrelated pain problem evaluations*. NO evaluations to order other pain procedures*. *These will be addressed at a separate and distinct evaluation encounter on the provider's evaluation schedule and not during procedure days.  Instructions: Food intake: Avoid eating anything solid for at least 8 hours prior to your procedure. Clear liquid intake: You may take clear liquids such as water  up to 2 hours prior to your procedure. (No carbonated drinks. No soda.) Transportation: Unless otherwise stated by your physician, bring a driver. (Driver cannot be a Market researcher, Pharmacist, community, or any other form of public  transportation.) Morning Medicines: Except for blood thinners, take all of your other morning medications with a sip of water . Make sure to take your heart and blood pressure medicines. If your blood pressure's lower number is above 100, the case will be rescheduled. Blood thinners: Make sure to stop your blood thinners as instructed.  If you take a blood thinner, but were not instructed to stop it, call our office [phone] and ask to talk to a nurse. Not stopping a blood thinner prior to certain procedures could lead to serious complications. Diabetics on insulin : Notify the staff so that you can be scheduled 1st case in the morning. If your diabetes requires high dose insulin , take only  of your normal insulin  dose the morning of the procedure and notify the staff that you have done so. Preventing infections: Shower with an antibacterial soap the morning of your procedure.  Build-up your immune system: Take 1000 mg of Vitamin C with every meal (3 times a day) the day prior to your procedure. Antibiotics: Inform the nursing staff if you are taking any antibiotics or if you have any conditions that may require antibiotics prior to procedures. (Example: recent joint implants)   Pregnancy: If you are pregnant make sure to notify the nursing staff. Not doing so may result in injury to the fetus, including death.  Sickness: If you have a cold, fever, or any active infections, call and cancel or reschedule your procedure. Receiving steroids while having an infection may result in complications. Arrival: You must be in the facility at least 30 minutes prior to your scheduled procedure. Tardiness: Your scheduled time is also the cutoff time. If you do not arrive at least 15 minutes prior to your procedure, you will be rescheduled.  Children: Do not bring any children with  you. Make arrangements to keep them home. Dress appropriately: There is always a possibility that your clothing may get soiled. Avoid  long dresses. Valuables: Do not bring any jewelry or valuables.  Reasons to call and reschedule or cancel your procedure: (Following these recommendations will minimize the risk of a serious complication.) Surgeries: Avoid having procedures within 2 weeks of any surgery. (Avoid for 2 weeks before or after any surgery). Flu Shots: Avoid having procedures within 2 weeks of a flu shots or . (Avoid for 2 weeks before or after immunizations). Barium: Avoid having a procedure within 7-10 days after having had a radiological study involving the use of radiological contrast. (Myelograms, Barium swallow or enema study). Heart attacks: Avoid any elective procedures or surgeries for the initial 6 months after a Myocardial Infarction (Heart Attack). Blood thinners: It is imperative that you stop these medications before procedures. Let us  know if you if you take any blood thinner.  Infection: Avoid procedures during or within two weeks of an infection (including chest colds or gastrointestinal problems). Symptoms associated with infections include: Localized redness, fever, chills, night sweats or profuse sweating, burning sensation when voiding, cough, congestion, stuffiness, runny nose, sore throat, diarrhea, nausea, vomiting, cold or Flu symptoms, recent or current infections. It is specially important if the infection is over the area that we intend to treat. Heart and lung problems: Symptoms that may suggest an active cardiopulmonary problem include: cough, chest pain, breathing difficulties or shortness of breath, dizziness, ankle swelling, uncontrolled high or unusually low blood pressure, and/or palpitations. If you are experiencing any of these symptoms, cancel your procedure and contact your primary care physician for an evaluation.  Remember:  Regular Business hours are:  Monday to Thursday 8:00 AM to 4:00 PM  Provider's Schedule: Eric Como, MD:  Procedure days: Tuesday and Thursday 7:30  AM to 4:00 PM  Wallie Sherry, MD:  Procedure days: Monday and Wednesday 7:30 AM to 4:00 PM Last  Updated: 11/27/2023 ______________________________________________________________________     ______________________________________________________________________    General Risks and Possible Complications  Patient Responsibilities: It is important that you read this as it is part of your informed consent. It is our duty to inform you of the risks and possible complications associated with treatments offered to you. It is your responsibility as a patient to read this and to ask questions about anything that is not clear or that you believe was not covered in this document.  Patient's Rights: You have the right to refuse treatment. You also have the right to change your mind, even after initially having agreed to have the treatment done. However, under this last option, if you wait until the last second to change your mind, you may be charged for the materials used up to that point.  Introduction: Medicine is not an Visual merchandiser. Everything in Medicine, including the lack of treatment(s), carries the potential for danger, harm, or loss (which is by definition: Risk). In Medicine, a complication is a secondary problem, condition, or disease that can aggravate an already existing one. All treatments carry the risk of possible complications. The fact that a side effects or complications occurs, does not imply that the treatment was conducted incorrectly. It must be clearly understood that these can happen even when everything is done following the highest safety standards.  No treatment: You can choose not to proceed with the proposed treatment alternative. The "PRO(s)" would include: avoiding the risk of complications associated with the therapy. The "CON(s)" would include:  not getting any of the treatment benefits. These benefits fall under one of three categories: diagnostic; therapeutic; and/or  palliative. Diagnostic benefits include: getting information which can ultimately lead to improvement of the disease or symptom(s). Therapeutic benefits are those associated with the successful treatment of the disease. Finally, palliative benefits are those related to the decrease of the primary symptoms, without necessarily curing the condition (example: decreasing the pain from a flare-up of a chronic condition, such as incurable terminal cancer).  General Risks and Complications: These are associated to most interventional treatments. They can occur alone, or in combination. They fall under one of the following six (6) categories: no benefit or worsening of symptoms; bleeding; infection; nerve damage; allergic reactions; and/or death. No benefits or worsening of symptoms: In Medicine there are no guarantees, only probabilities. No healthcare provider can ever guarantee that a medical treatment will work, they can only state the probability that it may. Furthermore, there is always the possibility that the condition may worsen, either directly, or indirectly, as a consequence of the treatment. Bleeding: This is more common if the patient is taking a blood thinner, either prescription or over the counter (example: Goody Powders, Fish oil, Aspirin, Garlic, etc.), or if suffering a condition associated with impaired coagulation (example: Hemophilia, cirrhosis of the liver, low platelet counts, etc.). However, even if you do not have one on these, it can still happen. If you have any of these conditions, or take one of these drugs, make sure to notify your treating physician. Infection: This is more common in patients with a compromised immune system, either due to disease (example: diabetes, cancer, human immunodeficiency virus [HIV], etc.), or due to medications or treatments (example: therapies used to treat cancer and rheumatological diseases). However, even if you do not have one on these, it can still  happen. If you have any of these conditions, or take one of these drugs, make sure to notify your treating physician. Nerve Damage: This is more common when the treatment is an invasive one, but it can also happen with the use of medications, such as those used in the treatment of cancer. The damage can occur to small secondary nerves, or to large primary ones, such as those in the spinal cord and brain. This damage may be temporary or permanent and it may lead to impairments that can range from temporary numbness to permanent paralysis and/or brain death. Allergic Reactions: Any time a substance or material comes in contact with our body, there is the possibility of an allergic reaction. These can range from a mild skin rash (contact dermatitis) to a severe systemic reaction (anaphylactic reaction), which can result in death. Death: In general, any medical intervention can result in death, most of the time due to an unforeseen complication. ______________________________________________________________________      ______________________________________________________________________    Steroid injections  Common steroids for injections Triamcinolone: Used by many sports medicine physicians for large joint and bursal injections, often combined with a local anesthetic like lidocaine . A study focusing on coccydynia (tailbone pain) found triamcinolone was more effective than betamethasone , suggesting it may also be preferable for other localized inflammation conditions. Methylprednisolone: A common alternative to triamcinolone that is also a strong anti-inflammatory. It is available in different formulations, with the acetate suspension being the long-acting option for intra-articular injections. Dexamethasone : This is a non-particulate steroid, meaning it has a lower risk of tissue damage compared to particulate steroids like triamcinolone and methylprednisolone. While less common for this specific  use,  it is an option for targeted injections.   Considerations for physicians Particulate vs. non-particulate steroids: Triamcinolone and methylprednisolone are particulate, meaning they can clump together. Dexamethasone  is non-particulate. Particulate steroids are often preferred for their longer-lasting effects but carry a theoretical higher risk for certain injections (though this is less of a concern in the costochondral joints). Combined injectate: Corticosteroids are typically mixed with a local anesthetic like lidocaine  to provide both immediate pain relief (from the anesthetic) and longer-term inflammation reduction (from the steroid). Imaging guidance: To ensure accurate placement of the needle and medication, physicians may use ultrasound or fluoroscopic guidance for the injection, especially in complex or refractory cases.   Patient guidance Before undergoing a steroid injection, discuss the options with your physician. They will determine the best steroid, dosage, and procedure for your specific case based on factors like: Severity of your condition History of response to other treatments Your overall health status Experience and preference of the physician  Last  Updated: 08/12/2024 ______________________________________________________________________

## 2024-09-29 ENCOUNTER — Other Ambulatory Visit: Payer: Self-pay

## 2024-09-29 ENCOUNTER — Ambulatory Visit: Admitting: Pulmonary Disease

## 2024-09-29 ENCOUNTER — Ambulatory Visit: Attending: Pain Medicine | Admitting: Pain Medicine

## 2024-09-29 ENCOUNTER — Encounter: Payer: Self-pay | Admitting: Pain Medicine

## 2024-09-29 ENCOUNTER — Encounter: Payer: Self-pay | Admitting: Pulmonary Disease

## 2024-09-29 ENCOUNTER — Ambulatory Visit: Payer: Self-pay | Admitting: Family Medicine

## 2024-09-29 VITALS — BP 156/82 | HR 102 | Temp 98.6°F | Resp 17 | Ht 63.0 in | Wt 255.0 lb

## 2024-09-29 VITALS — BP 120/78 | HR 94 | Temp 97.5°F | Ht 63.0 in | Wt 241.6 lb

## 2024-09-29 DIAGNOSIS — M25519 Pain in unspecified shoulder: Secondary | ICD-10-CM | POA: Diagnosis present

## 2024-09-29 DIAGNOSIS — M25511 Pain in right shoulder: Secondary | ICD-10-CM | POA: Insufficient documentation

## 2024-09-29 DIAGNOSIS — J9611 Chronic respiratory failure with hypoxia: Secondary | ICD-10-CM | POA: Diagnosis not present

## 2024-09-29 DIAGNOSIS — J4489 Other specified chronic obstructive pulmonary disease: Secondary | ICD-10-CM | POA: Diagnosis not present

## 2024-09-29 DIAGNOSIS — G8929 Other chronic pain: Secondary | ICD-10-CM | POA: Insufficient documentation

## 2024-09-29 DIAGNOSIS — F17210 Nicotine dependence, cigarettes, uncomplicated: Secondary | ICD-10-CM

## 2024-09-29 DIAGNOSIS — G4733 Obstructive sleep apnea (adult) (pediatric): Secondary | ICD-10-CM | POA: Diagnosis not present

## 2024-09-29 DIAGNOSIS — J45909 Unspecified asthma, uncomplicated: Secondary | ICD-10-CM | POA: Diagnosis not present

## 2024-09-29 DIAGNOSIS — Z09 Encounter for follow-up examination after completed treatment for conditions other than malignant neoplasm: Secondary | ICD-10-CM | POA: Diagnosis not present

## 2024-09-29 DIAGNOSIS — R7401 Elevation of levels of liver transaminase levels: Secondary | ICD-10-CM

## 2024-09-29 DIAGNOSIS — M25512 Pain in left shoulder: Secondary | ICD-10-CM | POA: Diagnosis present

## 2024-09-29 DIAGNOSIS — M549 Dorsalgia, unspecified: Secondary | ICD-10-CM | POA: Diagnosis present

## 2024-09-29 DIAGNOSIS — M5440 Lumbago with sciatica, unspecified side: Secondary | ICD-10-CM

## 2024-09-29 DIAGNOSIS — M542 Cervicalgia: Secondary | ICD-10-CM | POA: Insufficient documentation

## 2024-09-29 MED ORDER — AZITHROMYCIN 250 MG PO TABS: 250.0000 mg | ORAL_TABLET | Freq: Every day | ORAL | 0 refills | Status: DC

## 2024-09-29 NOTE — Progress Notes (Signed)
 Safety precautions to be maintained throughout the outpatient stay will include: orient to surroundings, keep bed in low position, maintain call bell within reach at all times, provide assistance with transfer out of bed and ambulation.

## 2024-09-29 NOTE — Telephone Encounter (Signed)
 Paitent was seen in the office today.  Nothing further needed.

## 2024-09-29 NOTE — Progress Notes (Signed)
 Synopsis: Referred in by [place] HERO, MD   Subjective:   PATIENT ID: Lindsay Harris: female DOB: July 22, 1976, MRN: [id number]  Chief Complaint  Patient presents with   COPD    SOB, not increased. Wheezing, congestion and cough with yellow sputum since last week.  Using Albuterol  Neb every 6hours, Symbicort  BID, Spiriva  once a day and Dupixent .     HPI Lindsay Harris is a 48 y.o female patient with a past medical history of severe persistent asthma on triple therapy presenting to the pulmonary clinic today for follow up on her asthma/COPD overlap  I last saw earlier in August 2024 for asthma and she seemed to be in an asthma exacerbation. Referred to the ED and was managed with IV steroids and discharged on 5 days prednisone . She did better and presents today for a follow up visit.   She has had asthma for the past 32 years with multiple exacerbations one of them at age 62 requiring intubation.   Labs:  EOS 300 2024  Total IgE 136 and Allergen panel is underwhelming mostly negative and briefly + for cockroach.   PFTs 2024 - Obstruction Gold Stage II, air trapping and decreased DLCO.   FH: Strong family history of asthma in her mother and grandmother.   SH: Active smoker, smokes 1 ppd for 30 years, no alcohol use and no illicit drug use. Has 1 Dog at home.   OV 05/06/2024 - Here for follow up on her COPD/Asthma overlap. Appears to be in severe COPD/Asthma exacerbation in office. Provided subcutaneous Methylpred 120mg  and duonebs in office and referred home to continue prednisone  and zypec with clear instructions that should she not feel any better to head to the ED. Unfortunately she had to be admitted the following day. This is her second severe exacerbation in 8 months. Besides Smoking cessation I am afraid we have to step up care to [facility]. Plan is to start her on dupixent .   OV 05/14/2024 - Lindsay Harris is here to follow up regarding her COPD/Asthma exacerbation and  hospitalization. She is doing better than last week but still with significant chest tightness and wheezing. I recommended her using her duonebs Q6H around the clock for the next 7 days. She is to complete her steroid taper and will add azithromycin  for the next 7 days. She is planned to start Dupixent  as well.    OV 06/11/2024 - Lindsay Harris is here to follow up regarding her COPD Stage II gpE and asthma overlap. She is doing better than last month when she was in an acute exacerbation. I have started her on dupixent  last encounter. She is on triple inhaler therapy and compliant. Undortunately she continues to smoke and is having difficulty quitting. She does complain of back pain that is hindering her ability to participate in rehab. I discussed with her importance of quitting smoking and she will try again. She is also asking for a referral to pain clinic which I will place.   OV 09/29/2024 - Lindsay Harris is here to follow up re her COPD Stage II gpE with asthma overlap. She is doing better from a respiratory standpoint. Has tried to quit smoking but lasted only 24 hours. She is excited to try again. She is losing weight and I congratulated her on that. Unfortunately she is going through a stressful period now and separating from her husband but with that she hasn't smoked more than half a pack per day. We ambulated off oxygen and she did well  and sustained her O2 sat at 100%. She agreed to pulmonary rehab which I highly encouraged her to do.   ROS All systems were reviewed and are negative except for the above.  Objective:   Vitals:   09/29/24 1540  BP: 120/78  Pulse: 94  Temp: (!) 97.5 F (36.4 C)  SpO2: 97%  Weight: 241 lb 9.6 oz (109.6 kg)  Height: 5' 3 (1.6 m)   97% on RA BMI Readings from Last 3 Encounters:  09/29/24 42.80 kg/m  09/29/24 45.17 kg/m  09/24/24 45.17 kg/m   Wt Readings from Last 3 Encounters:  09/29/24 241 lb 9.6 oz (109.6 kg)  09/29/24 255 lb (115.7 kg)  09/24/24 255 lb  (115.7 kg)    Physical Exam GEN: Moderate respiratory distress with accessory muscle use.  HEENT: Supple Neck, Reactive Pupils, EOMI  CVS: Normal S1, Normal S2, RRR, No murmurs or ES appreciated  Lungs: diffuse wheezing. No rales/ronchi.   Abdomen: Soft, non tender, non distended, + BS  Extremities: Warm and well perfused, No edema  Skin: No suspicious lesions appreciated  Psych: Good affect.   Ancillary Information   CBC    Component Value Date/Time   WBC 6.7 07/24/2024 1444   RBC 4.10 07/24/2024 1444   HGB 12.8 07/24/2024 1444   HCT 38.9 07/24/2024 1444   PLT 258 07/24/2024 1444   MCV 94.9 07/24/2024 1444   MCH 31.2 07/24/2024 1444   MCHC 32.9 07/24/2024 1444   RDW 14.6 07/24/2024 1444   LYMPHSABS 4.2 (H) 05/07/2024 1340   MONOABS 0.6 05/07/2024 1340   EOSABS 201 07/24/2024 1444   BASOSABS 54 07/24/2024 1444        No data to display         Labs and imaging were reviewed.   Assessment & Plan:  Lindsay Harris is a 48 y.o female patient with a past medical history of severe persistent asthma on triple therapy presenting to the pulmonary clinic today for further management of her asthma.   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   []  c/w budesonide -formoterol  [Symbicort ] 160-4.5 2puffs BID. []  Spiriva  daily.  []  Albuterol /Duonebs as needed. []  Dupixent  started 05/2024   []  Refer back to pulmonary rehab    #Mild OSA (AHI 6.8 2024) Reports lound snoring and apneic episodes. Overweight and increased neck circumference with extreme fatigue during the day. Significant Daytime sleepiness and multiple naps during the day.    []  c/w Auto CPAP 5-15 with 2L O2  []  Resmed Report reviewed and compliance is adequate >= 40%. AHI improved to less than 5. Patient feels significantly better with imprved daily fatigue.   Smoking/Tobacco Cessation  Counseling Lindsay Harris is a current user of tobacco or nicotine  products. She is ready to quit at this time. Counseling provided today addressed the risks of continued use and the benefits of cessation. Discussed tobacco/nicotine  use history, readiness to quit, and evidence-based treatment options including behavioral strategies, support resources, and pharmacologic therapies. Provided encouragement and educational materials on steps and resources to quit smoking. Patient questions were addressed, and follow-up recommended for continued support. Total time spent on counseling: 4 minutes.     #Debilitating back pain with sciatica  []  Refer to pain clinic.   RTC 6 months   I spent minutes caring for this patient today, including preparing to see  the patient, obtaining a medical history , reviewing a separately obtained history, performing a medically appropriate examination and/or evaluation, documenting clinical information in the electronic health record, and independently interpreting results (not separately reported/billed) and communicating results to the patient/family/caregiver  Darrin Barn, MD [facility] 09/29/2024 3:46 PM

## 2024-10-01 ENCOUNTER — Ambulatory Visit
Admission: RE | Admit: 2024-10-01 | Discharge: 2024-10-01 | Disposition: A | Discharge status: Home / Self Care | Source: Ambulatory Visit | Attending: Family Medicine | Admitting: Family Medicine

## 2024-10-01 ENCOUNTER — Other Ambulatory Visit

## 2024-10-01 DIAGNOSIS — Z1231 Encounter for screening mammogram for malignant neoplasm of breast: Secondary | ICD-10-CM | POA: Diagnosis present

## 2024-10-01 DIAGNOSIS — R7401 Elevation of levels of liver transaminase levels: Secondary | ICD-10-CM

## 2024-10-03 MED ORDER — ORILISSA 200 MG PO TABS: 1.0000 | ORAL_TABLET | Freq: Two times a day (BID) | ORAL | 0 refills | Status: DC

## 2024-10-03 NOTE — Addendum Note (Signed)
 Addended by: ALAINA BIRMINGHAM R on: 10/03/2024 03:50 PM   Modules accepted: Orders

## 2024-10-06 ENCOUNTER — Ambulatory Visit (INDEPENDENT_AMBULATORY_CARE_PROVIDER_SITE_OTHER): Admitting: Urology

## 2024-10-06 ENCOUNTER — Ambulatory Visit: Payer: Self-pay | Admitting: Family Medicine

## 2024-10-06 VITALS — BP 111/57 | HR 82

## 2024-10-06 DIAGNOSIS — N3946 Mixed incontinence: Secondary | ICD-10-CM | POA: Diagnosis not present

## 2024-10-06 LAB — URINALYSIS, COMPLETE
Bilirubin, UA: NEGATIVE
Glucose, UA: NEGATIVE
Ketones, UA: NEGATIVE
Leukocytes,UA: NEGATIVE
Nitrite, UA: NEGATIVE
Protein,UA: NEGATIVE
Specific Gravity, UA: 1.03 (ref 1.005–1.030)
Urobilinogen, Ur: 0.2 mg/dL (ref 0.2–1.0)
pH, UA: 6 (ref 5.0–7.5)

## 2024-10-06 LAB — MICROSCOPIC EXAMINATION: Epithelial Cells (non renal): >10 /HPF — AB (ref 0–10)

## 2024-10-06 MED ORDER — GEMTESA 75 MG PO TABS: 75.0000 mg | ORAL_TABLET | Freq: Every day | ORAL | 11 refills | Status: AC

## 2024-10-06 MED ORDER — GEMTESA 75 MG PO TABS: 75.0000 mg | ORAL_TABLET | Freq: Every day | ORAL | Status: DC

## 2024-10-06 NOTE — Progress Notes (Signed)
 10/06/2024 3:16 PM   Lindsay Harris 1976-09-01 [id number]  Referring provider: Kayla Jeoffrey RAMAN, FNP [address] [place],  [place] [address]  Chief Complaint  Patient presents with   Follow-up    HPI: I was consulted to assist the patient as urinary incontinence.  She leaks with coughing sneezing bending lifting.  She has urge incontinence.  Both are significant.  She has mild to moderate bedwetting.  She wears 4-5 pads a day that are moderately wet or soaked   She voids every hour and cannot hold it for 2 hours.  She gets up almost every hour at night.  She takes a fluid pill and she has ankle edema   Flow is slower.  She does not feel empty.   She has not had a hysterectomy   She is on home oxygen for COPD      Grade 2 hypermobility bladder neck and a positive cough test.  No significant prolapse.  Blood on tip of speculum and she is on her cycle    Patient has mixed incontinence and bedwetting.  She is on home oxygen.  She has significant frequency and nocturia.  She has medical comorbidities. Recheck urinalysis next time to make sure there is no blood in the urine.  Call if culture positive.  She had a normal CT scan of the kidneys in June 2024   Patient has significant stress incontinence on pelvic examination but home oxygen and medical comorbidities will need to be taken to consideration   Urine culture negative She is smoked for almost 40 years at age 27 Patient underwent urodynamics.  She voided 322 mL with a maximal flow at 50 mL/s.  She had a prolonged pattern.  She was catheterized for 13 mL.  Maximum bladder capacity was 821 mL.  She decreased bladder sensation.  Her bladder stable.  Her cuff leak point pressure 200 mL was 39 cm of water  with severe leakage.  Cuff leak point pressure at 600 mL was 41 cm of water  with severe leakage.  Valsalva leak point pressure was 23 cmH2O at the same volume with severe leakage.  During voiding she voided 577 mL.  Maximal flow  was 15 mL/s.  Voiding pressure was approximately 46 to 65 cm of water .  EMG activity within normal limits.  Bladder neck descended approximately centimeter.    Today no blood on speculum.  She only has mild hypermobility.  Based on body habitus she has a relatively deep vaginal vault that would make Bulkamid challenging   Cystoscopy -patient underwent flexible cystoscopy.  Bladder Koza trigone were normal.  No cystitis.  No carcinoma    Patient does clinically have an overactive bladder with urge incontinence frequency and bedwetting.  She has significant stress incontinence by history physical and urodynamics.  She has medical comorbidities and is on home oxygen.  I will try to help her with medical and behavioral therapy.  Physical therapy offered.  It would be challenging to do a bulking agent in the clinic based on body habitus.  She would need medical clearance for anesthesia for Bulkamid or surgery   Reassess in Gemtesa .  She held off on physical therapy.  She hopes to be helped without surgery  Today Dramatic reduction in urge incontinence and frequency.  She is very pleased.  Clinically not infected.  Frequency stable    PMH: Past Medical History:  Diagnosis Date   Asthma 03/1987   COPD (chronic obstructive pulmonary disease) (  HCC)    Dyspnea    GERD (gastroesophageal reflux disease)    Headache    migraines   Plantar fascia syndrome 09/24/2024   B/L   Thyroid disease [id number]    Surgical History: Past Surgical History:  Procedure Laterality Date   COLONOSCOPY WITH PROPOFOL  N/A 08/09/2023   Procedure: COLONOSCOPY WITH BIOPSY;  Surgeon: Unk Corinn Skiff, MD;  Location[facility];  Service: Endoscopy;  Laterality: N/A;   POLYPECTOMY N/A 08/09/2023   Procedure: POLYPECTOMY;  Surgeon: Unk Corinn Skiff, MD;  Location: [facility];  Service: Endoscopy;  Laterality: N/A;    Home Medications:  Allergies as of 10/06/2024       Reactions   Amoxicillin  Anaphylaxis, Hives        Medication List        Accurate as of October 06, 2024  3:16 PM. If you have any questions, ask your nurse or doctor.          AeroChamber MV inhaler Use as instructed   albuterol  (2.5 MG/3ML) 0.083% nebulizer solution Commonly known as: PROVENTIL  Take 2.5 mg by nebulization every 4 (four) hours as needed.   albuterol  108 (90 Base) MCG/ACT inhaler Commonly known as: VENTOLIN  HFA Inhale 2 puffs into the lungs every 6 (six) hours as needed.   azithromycin  250 MG tablet Commonly known as: ZITHROMAX  Take 1 tablet (250 mg total) by mouth daily.   cyclobenzaprine 5 MG tablet Commonly known as: FLEXERIL Take 1 tablet (5 mg total) by mouth 3 (three) times daily as needed for muscle spasms.   DULoxetine  60 MG capsule Commonly known as: CYMBALTA  Take 60 mg by mouth 2 (two) times daily.   Dupixent  300 MG/2ML Soaj Generic drug: Dupilumab  Inject 300 mg into the skin every 14 (fourteen) days.   EPINEPHrine 0.3 mg/0.3 mL Soaj injection Commonly known as: EPI-PEN Inject 0.3 mg into the muscle as needed for anaphylaxis.   eszopiclone 1 MG Tabs tablet Commonly known as: LUNESTA Take 1 mg by mouth at bedtime as needed.   furosemide 20 MG tablet Commonly known as: LASIX Take 20 mg by mouth daily as needed.   gabapentin  100 MG capsule Commonly known as: NEURONTIN  Take 400 mg by mouth 2 (two) times daily. Take 400mg  in the morning and 400mg  in the afternoon.   gabapentin  600 MG tablet Commonly known as: NEURONTIN  Take 600 mg by mouth at bedtime.   Gemtesa  75 MG Tabs Generic drug: Vibegron  Take 1 tablet (75 mg total) by mouth daily.   lactulose  10 GM/15ML solution Commonly known as: CHRONULAC  Take 30 mLs (20 g total) by mouth daily.   Linzess  290 MCG Caps capsule Generic drug: linaclotide  TAKE 1 CAPSULE BY MOUTH DAILY BEFORE BREAKFAST.   montelukast  10 MG tablet Commonly known as: SINGULAIR  Take 10 mg by mouth at bedtime.   ondansetron   4 MG disintegrating tablet Commonly known as: ZOFRAN -ODT Take 1 tablet (4 mg total) by mouth every 8 (eight) hours as needed for nausea.   Orilissa  200 MG Tabs Generic drug: Elagolix Sodium  Take 1 tablet (200 mg total) by mouth 2 (two) times daily.   pantoprazole  40 MG tablet Commonly known as: PROTONIX  TAKE 1 TABLET BY MOUTH EVERY DAY FOR HEARTBURN, TAKE 1 TAB TWICE DAILY FOR THE FIRST 2 WEEK   propranolol  ER 120 MG 24 hr capsule Commonly known as: INDERAL  LA Take 120 mg by mouth daily.   propranolol  20 MG tablet Commonly known as: INDERAL  TAKE 1 TABLET BY MOUTH 3  TIMES DAILY AS NEEDED.   rosuvastatin  40 MG tablet Commonly known as: CRESTOR  Take 1 tablet (40 mg total) by mouth daily.   Simethicone 180 MG Caps Take 1 capsule by mouth 3 (three) times daily as needed.   Spiriva  HandiHaler 18 MCG Caps Generic drug: Tiotropium Bromide  Place 1 capsule (18 mcg total) into inhaler and inhale daily.   SUMAtriptan 25 MG tablet Commonly known as: IMITREX Take 25 mg by mouth every 2 (two) hours as needed.   Symbicort  160-4.5 MCG/ACT inhaler Generic drug: budesonide -formoterol  INHALE 2 PUFFS INTO THE LUNGS IN THE MORNING AND AT BEDTIME.   Vitamin D  (Ergocalciferol ) 1.25 MG (50000 UNIT) Caps capsule Commonly known as: DRISDOL  Take by mouth.   Vitamin D3 125 MCG (5000 UT) Caps Take 1 capsule (5,000 Units total) by mouth daily with breakfast. Take along with calcium  and magnesium .        Allergies:  Allergies  Allergen Reactions   Amoxicillin Anaphylaxis and Hives    Family History: Family History  Problem Relation Age of Onset   Colon cancer Mother 42   Anemia Mother    Asthma Mother    Ovarian cancer Sister 29       Stage 3   Colon cancer Maternal Uncle        early 30s   Breast cancer Maternal Grandmother        twice: early 58s and mid 30s   Asthma Maternal Grandfather    Heart attack Maternal Grandfather    Heart disease Maternal Grandfather    Heart  failure Maternal Grandfather    Hypertension Maternal Grandfather     Social History:  reports that she has been smoking cigarettes. She started smoking about 38 years ago. She has a 38.8 pack-year smoking history. She does not have any smokeless tobacco history on file. She reports that she does not drink alcohol and does not use drugs.  ROS:                                        Physical Exam: LMP 08/19/2023 (Approximate)   Constitutional:  Alert and oriented, No acute distress. HEENT: [place] AT, moist mucus membranes.  Trachea midline, no masses.  Laboratory Data: Lab Results  Component Value Date   WBC 6.7 07/24/2024   HGB 12.8 07/24/2024   HCT 38.9 07/24/2024   MCV 94.9 07/24/2024   PLT 258 07/24/2024    Lab Results  Component Value Date   CREATININE 0.82 09/25/2024    No results found for: PSA  No results found for: TESTOSTERONE  Lab Results  Component Value Date   HGBA1C 6.8 (H) 07/24/2024    Urinalysis    Component Value Date/Time   COLORURINE YELLOW 04/21/2024 0203   APPEARANCEUR Slightly cloudy 07/21/2024 1506   LABSPEC 1.020 04/21/2024 0203   PHURINE 6.0 04/21/2024 0203   GLUCOSEU Trace (A) 07/21/2024 1506   HGBUR NEGATIVE 04/21/2024 0203   BILIRUBINUR Negative 07/21/2024 1506   KETONESUR NEGATIVE 04/21/2024 0203   PROTEINUR Negative 07/21/2024 1506   PROTEINUR NEGATIVE 04/21/2024 0203   UROBILINOGEN 0.2 09/19/2018 1021   NITRITE Negative 07/21/2024 1506   NITRITE NEGATIVE 04/21/2024 0203   LEUKOCYTESUR Negative 07/21/2024 1506   LEUKOCYTESUR NEGATIVE 04/21/2024 0203    Pertinent Imaging:   Assessment & Plan: 2 weeks of Gemtesa  samples given.  Prescription sent.  Reevaluate in 3 months.  Hopefully she can  afford the medication.  It appears overactive bladder is the main issue.  1. Mixed incontinence (Primary)  - Urinalysis, Complete   No follow-ups on file.  Glendia DELENA Elizabeth, MD  [facility] [address], [address] [place], [place] [address] [phone]

## 2024-10-06 NOTE — Addendum Note (Signed)
 Addended byBETHA CORIE PLATER on: 10/06/2024 03:55 PM   Modules accepted: Orders

## 2024-10-08 ENCOUNTER — Ambulatory Visit (INDEPENDENT_AMBULATORY_CARE_PROVIDER_SITE_OTHER): Admitting: Obstetrics & Gynecology

## 2024-10-08 ENCOUNTER — Encounter: Payer: Self-pay | Admitting: Obstetrics & Gynecology

## 2024-10-08 VITALS — BP 121/82 | HR 99 | Ht 63.0 in | Wt 243.8 lb

## 2024-10-08 DIAGNOSIS — Z01419 Encounter for gynecological examination (general) (routine) without abnormal findings: Secondary | ICD-10-CM

## 2024-10-08 DIAGNOSIS — Z800 Family history of malignant neoplasm of digestive organs: Secondary | ICD-10-CM

## 2024-10-08 DIAGNOSIS — Z8041 Family history of malignant neoplasm of ovary: Secondary | ICD-10-CM

## 2024-10-08 DIAGNOSIS — Z803 Family history of malignant neoplasm of breast: Secondary | ICD-10-CM | POA: Diagnosis not present

## 2024-10-08 MED ORDER — ORILISSA 200 MG PO TABS: 1.0000 | ORAL_TABLET | Freq: Two times a day (BID) | ORAL | 12 refills | Status: AC

## 2024-10-08 NOTE — Progress Notes (Signed)
 GYNECOLOGY ANNUAL PHYSICAL EXAM PROGRESS NOTE  Subjective:    Lindsay Harris is a 48 y.o. married P 2  (22 and 41 yo daughters) who presents for an annual exam. She is very happy with the Orilissa , essentially no pelvic pain. She would like a refill. She ran out for about 2 weeks and noted a sudden return of the pain.  The patient is not currently sexually active. She has not had sex for at least a year, due to relationship issues. The patient participates in regular exercise: no. Has the patient ever been transfused or tattooed?: yes. The patient reports that there is not domestic violence in her life.   The patient has the following complaints today: She would like a refill of Orilissa .  Menstrual History: Menarche age: 64 Patient's last menstrual period was 08/19/2023 (approximate).   Fh- + breast cancer in maternal GM, +ovarian in some maternal relative. + cervical cancer in her mom. + colon cancer in maternal uncle  Gynecologic History:   History of STI's:  Last Pap: 2024. Results were: normal.  Denies h/o abnormal pap smears. Last mammogram: 2025. Results were: normal       OB History  No obstetric history on file.    Past Medical History:  Diagnosis Date   Asthma 03/1987   COPD (chronic obstructive pulmonary disease) (HCC)    Dyspnea    GERD (gastroesophageal reflux disease)    Headache    migraines   Plantar fascia syndrome 09/24/2024   B/L   Thyroid disease [id number]    Past Surgical History:  Procedure Laterality Date   COLONOSCOPY WITH PROPOFOL  N/A 08/09/2023   Procedure: COLONOSCOPY WITH BIOPSY;  Surgeon: Unk Corinn Skiff, MD;  [facility];  Service: Endoscopy;  Laterality: N/A;   POLYPECTOMY N/A 08/09/2023   Procedure: POLYPECTOMY;  Surgeon: Unk Corinn Skiff, MD;  Location: [facility];  Service: Endoscopy;  Laterality: N/A;    Family History  Problem Relation Age of Onset   Colon cancer Mother 13   Anemia Mother     Asthma Mother    Ovarian cancer Sister 31       Stage 3   Colon cancer Maternal Uncle        early 30s   Breast cancer Maternal Grandmother        twice: early 67s and mid 59s   Asthma Maternal Grandfather    Heart attack Maternal Grandfather    Heart disease Maternal Grandfather    Heart failure Maternal Grandfather    Hypertension Maternal Grandfather     Social History   Socioeconomic History   Marital status: Married    Spouse name: Not on file   Number of children: Not on file   Years of education: Not on file   Highest education level: 12th grade  Occupational History   Not on file  Tobacco Use   Smoking status: Every Day    Current packs/day: 1.00    Average packs/day: 1 pack/day for 38.8 years (38.8 ttl pk-yrs)    Types: Cigarettes    Start date: 1   Smokeless tobacco: Not on file   Tobacco comments:    Has smoked since age 34        Smokes 0.5 PPD- khj 09/29/2024  Vaping Use   Vaping status: Never Used  Substance and Sexual Activity   Alcohol use: No   Drug use: No   Sexual activity: Yes    Birth control/protection: Surgical  Comment: BTL  Other Topics Concern   Not on file  Social History Narrative   Not on file   Social Drivers of Health   Financial Resource Strain: Low Risk  (07/20/2024)   Overall Financial Resource Strain (CARDIA)    Difficulty of Paying Living Expenses: Not very hard  Food Insecurity: No Food Insecurity (07/20/2024)   Hunger Vital Sign    Worried About Running Out of Food in the Last Year: Never true    Ran Out of Food in the Last Year: Never true  Transportation Needs: No Transportation Needs (07/20/2024)   PRAPARE - Administrator, Civil Service (Medical): No    Lack of Transportation (Non-Medical): No  Physical Activity: Inactive (07/20/2024)   Exercise Vital Sign    Days of Exercise per Week: 0 days    Minutes of Exercise per Session: Not on file  Stress: Stress Concern Present (07/20/2024)   [employer] of Occupational [employer] - Occupational Stress Questionnaire    Feeling of Stress: Very much  Social Connections: Socially Isolated (07/20/2024)   Social Connection and Isolation Panel    Frequency of Communication with Friends and Family: Twice a week    Frequency of Social Gatherings with Friends and Family: Never    Attends Religious Services: Never    Database administrator or Organizations: No    Attends Engineer, structural: Not on file    Marital Status: Married  Catering manager Violence: Not on file    Current Outpatient Medications on File Prior to Visit  Medication Sig Dispense Refill   albuterol  (PROVENTIL ) (2.5 MG/3ML) 0.083% nebulizer solution Take 2.5 mg by nebulization every 4 (four) hours as needed.     albuterol  (VENTOLIN  HFA) 108 (90 Base) MCG/ACT inhaler Inhale 2 puffs into the lungs every 6 (six) hours as needed. 3 each 3   azithromycin  (ZITHROMAX ) 250 MG tablet Take 1 tablet (250 mg total) by mouth daily. 6 tablet 0   Cholecalciferol (VITAMIN D3) 125 MCG (5000 UT) CAPS Take 1 capsule (5,000 Units total) by mouth daily with breakfast. Take along with calcium  and magnesium . 90 capsule 0   cyclobenzaprine (FLEXERIL) 5 MG tablet Take 1 tablet (5 mg total) by mouth 3 (three) times daily as needed for muscle spasms. 90 tablet 3   DULoxetine  (CYMBALTA ) 60 MG capsule Take 60 mg by mouth 2 (two) times daily.     Dupilumab  (DUPIXENT ) 300 MG/2ML SOAJ Inject 300 mg into the skin every 14 (fourteen) days. 12 mL 1   Elagolix Sodium  (ORILISSA ) 200 MG TABS Take 1 tablet (200 mg total) by mouth 2 (two) times daily. 56 tablet 0   EPINEPHrine 0.3 mg/0.3 mL IJ SOAJ injection Inject 0.3 mg into the muscle as needed for anaphylaxis.     eszopiclone (LUNESTA) 1 MG TABS tablet Take 1 mg by mouth at bedtime as needed.     furosemide (LASIX) 20 MG tablet Take 20 mg by mouth daily as needed.     gabapentin  (NEURONTIN ) 100 MG capsule Take 400 mg by mouth 2 (two) times daily. Take  400mg  in the morning and 400mg  in the afternoon.     gabapentin  (NEURONTIN ) 600 MG tablet Take 600 mg by mouth at bedtime.     lactulose  (CHRONULAC ) 10 GM/15ML solution Take 30 mLs (20 g total) by mouth daily. 900 mL 3   linaclotide  (LINZESS ) 290 MCG CAPS capsule TAKE 1 CAPSULE BY MOUTH DAILY BEFORE BREAKFAST. 90 capsule 0   montelukast  (  SINGULAIR ) 10 MG tablet Take 10 mg by mouth at bedtime.     ondansetron  (ZOFRAN -ODT) 4 MG disintegrating tablet Take 1 tablet (4 mg total) by mouth every 8 (eight) hours as needed for nausea. 30 tablet 0   pantoprazole  (PROTONIX ) 40 MG tablet TAKE 1 TABLET BY MOUTH EVERY DAY FOR HEARTBURN, TAKE 1 TAB TWICE DAILY FOR THE FIRST 2 WEEK 90 tablet 2   propranolol  (INDERAL ) 20 MG tablet TAKE 1 TABLET BY MOUTH 3 TIMES DAILY AS NEEDED. 270 tablet 1   propranolol  ER (INDERAL  LA) 120 MG 24 hr capsule Take 120 mg by mouth daily.     rosuvastatin  (CRESTOR ) 40 MG tablet Take 1 tablet (40 mg total) by mouth daily. 90 tablet 1   Simethicone 180 MG CAPS Take 1 capsule by mouth 3 (three) times daily as needed.     Spacer/Aero-Holding Chambers (AEROCHAMBER MV) inhaler Use as instructed 1 each 0   SPIRIVA  HANDIHALER 18 MCG inhalation capsule Place 1 capsule (18 mcg total) into inhaler and inhale daily. 90 capsule 3   SUMAtriptan (IMITREX) 25 MG tablet Take 25 mg by mouth every 2 (two) hours as needed.     SYMBICORT  160-4.5 MCG/ACT inhaler INHALE 2 PUFFS INTO THE LUNGS IN THE MORNING AND AT BEDTIME. 30.6 each 4   Vibegron  (GEMTESA ) 75 MG TABS Take 1 tablet (75 mg total) by mouth daily. 42 tablet    Vibegron  (GEMTESA ) 75 MG TABS Take 1 tablet (75 mg total) by mouth daily. 42 tablet    Vibegron  (GEMTESA ) 75 MG TABS Take 1 tablet (75 mg total) by mouth daily. 30 tablet 11   Vitamin D , Ergocalciferol , (DRISDOL ) 1.25 MG (50000 UNIT) CAPS capsule Take by mouth.     No current facility-administered medications on file prior to visit.    Allergies  Allergen Reactions   Amoxicillin  Anaphylaxis and Hives     Review of Systems Constitutional: negative for chills, fatigue, fevers and sweats Eyes: negative for irritation, redness and visual disturbance Ears, nose, mouth, throat, and face: negative for hearing loss, nasal congestion, snoring and tinnitus Respiratory: negative for asthma, cough, sputum Cardiovascular: negative for chest pain, dyspnea, exertional chest pressure/discomfort, irregular heart beat, palpitations and syncope Gastrointestinal: negative for abdominal pain, change in bowel habits, nausea and vomiting Genitourinary: negative for abnormal menstrual periods, genital lesions, sexual problems and vaginal discharge, dysuria and urinary incontinence Integument/breast: negative for breast lump, breast tenderness and nipple discharge Hematologic/lymphatic: negative for bleeding and easy bruising Musculoskeletal:negative for back pain and muscle weakness Neurological: negative for dizziness, headaches, vertigo and weakness Endocrine: negative for diabetic symptoms including polydipsia, polyuria and skin dryness Allergic/Immunologic: negative for hay fever and urticaria      Objective:  Height 5' 3 (1.6 m), weight 243 lb 12.8 oz (110.6 kg), last menstrual period 08/19/2023. Body mass index is 43.19 kg/m.  Well nourished, well hydrated White female, no apparent distress She is ambulating with a cane and conversing normally.   General Appearance:    Alert, cooperative, no distress, appears stated age, multiple tatooes  Head:    Normocephalic, without obvious abnormality, atraumatic  Eyes:    PERRL, conjunctiva/corneas clear, EOM's intact, both eyes  Ears:    Normal external ear canals, both ears  Nose:   Nares normal, septum midline, mucosa normal, no drainage or sinus tenderness  Throat:   Lips, mucosa, and tongue normal; teeth and gums normal  Neck:   Supple, symmetrical, trachea midline, no adenopathy; thyroid: no enlargement/tenderness/nodules; no  carotid  bruit or JVD  Back:     Symmetric, no curvature, ROM normal, no CVA tenderness  Lungs:     Clear to auscultation bilaterally, respirations unlabored  Chest Wall:    No tenderness or deformity   Heart:    Regular rate and rhythm, S1 and S2 normal, no murmur, rub or gallop  Breast Exam:    No tenderness, masses, or nipple abnormality  Abdomen:     Soft, non-tender, bowel sounds active all four quadrants, no masses, no organomegaly.    Genitalia:    Pelvic:external genitalia normal, vagina without lesions, discharge, or tenderness,  Cervix normal in appearance, no cervical motion tenderness, no adnexal masses or tenderness.  Uterus normal size, shape, mobile, regular contours, nontender.     Extremities:   Extremities normal, atraumatic, no cyanosis or edema  Pulses:   2+ and symmetric all extremities  Skin:   Skin color, texture, turgor normal, no rashes or lesions  Lymph nodes:   Cervical, supraclavicular, and axillary nodes normal  Neurologic:   CNII-XII intact, normal strength, sensation and reflexes throughout   .  Labs:  Lab Results  Component Value Date   WBC 6.7 07/24/2024   HGB 12.8 07/24/2024   HCT 38.9 07/24/2024   MCV 94.9 07/24/2024   PLT 258 07/24/2024    Lab Results  Component Value Date   CREATININE 0.82 09/25/2024   BUN 8 09/25/2024   NA 141 09/25/2024   K 4.1 09/25/2024   CL 106 09/25/2024   CO2 26 09/25/2024    Lab Results  Component Value Date   ALT 81 (H) 09/25/2024   AST 42 (H) 09/25/2024   ALKPHOS 114 07/30/2024   BILITOT 0.3 09/25/2024    Lab Results  Component Value Date   TSH 3.27 07/24/2024     Assessment:   Well woman FH of ovarian, breast, colon cancers   Plan:  Blood tests: genetic testing due to FH of colon and ovarian cancer. Breast self exam technique reviewed and patient encouraged to perform self-exam monthly. Contraception: abstinence. Discussed healthy lifestyle modifications. Mammogram UTD and normal Pap smear  UTD and normal Flu vaccine: declined Follow up in 1 year for annual exam   Starla Harland BROCKS, MD [facility]

## 2024-10-09 ENCOUNTER — Ambulatory Visit
Admission: RE | Admit: 2024-10-09 | Discharge: 2024-10-09 | Disposition: A | Discharge status: Home / Self Care | Source: Ambulatory Visit | Attending: Pain Medicine | Admitting: Pain Medicine

## 2024-10-09 ENCOUNTER — Encounter: Payer: Self-pay | Admitting: Pain Medicine

## 2024-10-09 ENCOUNTER — Ambulatory Visit (HOSPITAL_BASED_OUTPATIENT_CLINIC_OR_DEPARTMENT_OTHER): Admitting: Pain Medicine

## 2024-10-09 VITALS — BP 117/47 | HR 101 | Temp 97.3°F | Resp 19 | Ht 63.0 in | Wt 242.0 lb

## 2024-10-09 DIAGNOSIS — M549 Dorsalgia, unspecified: Secondary | ICD-10-CM | POA: Diagnosis present

## 2024-10-09 DIAGNOSIS — M5030 Other cervical disc degeneration, unspecified cervical region: Secondary | ICD-10-CM | POA: Diagnosis present

## 2024-10-09 DIAGNOSIS — G8929 Other chronic pain: Secondary | ICD-10-CM | POA: Insufficient documentation

## 2024-10-09 DIAGNOSIS — M5412 Radiculopathy, cervical region: Secondary | ICD-10-CM | POA: Insufficient documentation

## 2024-10-09 DIAGNOSIS — M25519 Pain in unspecified shoulder: Secondary | ICD-10-CM | POA: Insufficient documentation

## 2024-10-09 DIAGNOSIS — M5090 Cervical disc disorder, unspecified, unspecified cervical region: Secondary | ICD-10-CM

## 2024-10-09 DIAGNOSIS — M542 Cervicalgia: Secondary | ICD-10-CM | POA: Diagnosis present

## 2024-10-09 LAB — CULTURE, URINE COMPREHENSIVE

## 2024-10-09 MED ORDER — LIDOCAINE HCL 2 % IJ SOLN
20.0000 mL | Freq: Once | INTRAMUSCULAR | Status: AC
  Administered 2024-10-09: 400 mg

## 2024-10-09 MED ORDER — ROPIVACAINE HCL 2 MG/ML IJ SOLN
INTRAMUSCULAR | Status: AC
  Filled 2024-10-09: qty 20

## 2024-10-09 MED ORDER — SODIUM CHLORIDE (PF) 0.9 % IJ SOLN
INTRAMUSCULAR | Status: AC
Start: 2024-10-09 — End: 2024-10-09
  Filled 2024-10-09: qty 10

## 2024-10-09 MED ORDER — SODIUM CHLORIDE 0.9% FLUSH
1.0000 mL | Freq: Once | INTRAVENOUS | Status: AC
  Administered 2024-10-09: 1 mL

## 2024-10-09 MED ORDER — IOHEXOL 180 MG/ML  SOLN
10.0000 mL | Freq: Once | INTRAMUSCULAR | Status: AC
  Administered 2024-10-09: 10 mL via EPIDURAL

## 2024-10-09 MED ORDER — PENTAFLUOROPROP-TETRAFLUOROETH EX AERO: INHALATION_SPRAY | Freq: Once | CUTANEOUS | Status: AC

## 2024-10-09 MED ORDER — DEXAMETHASONE SOD PHOSPHATE PF 10 MG/ML IJ SOLN
10.0000 mg | Freq: Once | INTRAMUSCULAR | Status: AC
  Administered 2024-10-09: 10 mg

## 2024-10-09 MED ORDER — IOHEXOL 180 MG/ML  SOLN
INTRAMUSCULAR | Status: AC
  Filled 2024-10-09: qty 10

## 2024-10-09 MED ORDER — FENTANYL CITRATE (PF) 100 MCG/2ML IJ SOLN
INTRAMUSCULAR | Status: AC
  Filled 2024-10-09: qty 2

## 2024-10-09 MED ORDER — MIDAZOLAM HCL 5 MG/5ML IJ SOLN
0.5000 mg | Freq: Once | INTRAMUSCULAR | Status: AC
  Administered 2024-10-09: 2 mg via INTRAVENOUS
  Administered 2024-10-09: 1 mg via INTRAVENOUS

## 2024-10-09 MED ORDER — LIDOCAINE HCL 2 % IJ SOLN
INTRAMUSCULAR | Status: AC
Start: 2024-10-09 — End: 2024-10-09
  Filled 2024-10-09: qty 20

## 2024-10-09 MED ORDER — FENTANYL CITRATE (PF) 100 MCG/2ML IJ SOLN: 25.0000 ug | INTRAMUSCULAR | Status: DC | PRN

## 2024-10-09 MED ORDER — MIDAZOLAM HCL 5 MG/5ML IJ SOLN
INTRAMUSCULAR | Status: AC
  Filled 2024-10-09: qty 5

## 2024-10-09 MED ORDER — ROPIVACAINE HCL 2 MG/ML IJ SOLN
1.0000 mL | Freq: Once | INTRAMUSCULAR | Status: AC
  Administered 2024-10-09: 1 mL via EPIDURAL

## 2024-10-09 NOTE — Patient Instructions (Signed)

## 2024-10-09 NOTE — Progress Notes (Signed)
 PROVIDER NOTE: Interpretation of information contained herein should be left to medically-trained personnel. Specific patient instructions are provided elsewhere under Patient Instructions section of medical record. This document was created in part using STT-dictation technology, any transcriptional errors that may result from this process are unintentional.  Patient: Lindsay Harris Type: Established DOB: 25-Nov-1976 MRN: [id number] PCP: Kayla Jeoffrey RAMAN, FNP  Service: Procedure DOS: 10/09/2024 Setting: Ambulatory Location: Ambulatory outpatient facility Delivery: Face-to-face Provider: Eric DELENA Como, MD Specialty: Interventional Pain Management Specialty designation: 09 Location: Outpatient facility Ref. Prov.: Kayla Jeoffrey RAMAN, FNP       Interventional Therapy   Type: Cervical Epidural Steroid injection (CESI) (Interlaminar) #2  Laterality: Right (-RT)  Level: C7-T1 DOS: 10/09/2024  Provider: Eric DELENA Como, MD Imaging: Fluoroscopy-guided Spinal ([id number]) Anesthesia: Local anesthesia (1-2% Lidocaine ) Anxiolysis: IV Versed  3.0 mg Sedation: Minimal Sedation None required. No Fentanyl administered.          Medical Necessity Purpose: Diagnostic/Therapeutic Rationale (medical necessity): procedure needed and proper for the diagnosis and/or treatment of Lindsay Harris's medical symptoms and needs. Indications: Cervicalgia, cervical radicular pain, degenerative disc disease, severe enough to impact quality of life or function. 1. Cervical neck pain with evidence of disc disease   2. Cervical radiculitis (Right)   3. Cervicalgia   4. Chronic neck and back pain   5. DDD (degenerative disc disease), cervical   6. Neck and shoulder pain   7. Neck pain of over 3 months duration   8. Posterior neck pain    NAS-11 Pain score:   Pre-procedure: 7 /10   Post-procedure: 0-No pain/10     Position  Prep  Materials:  Location setting: Procedure suite Position: Prone, on  modified reverse trendelenburg to facilitate breathing, with head in head-cradle. Pillows positioned under chest (below chin-level) with cervical spine flexed. Safety Precautions: Patient was assessed for positional comfort and pressure points before starting the procedure. Prepping solution: DuraPrep (Iodine Povacrylex [0.7% available iodine] and Isopropyl Alcohol, 74% w/w) Prep Area: Entire  cervicothoracic region Approach: percutaneous, paramedial Intended target: Posterior cervical epidural space Materials Procedure:  Tray: Epidural Needle(s): Epidural (Tuohy) Qty: 1 Length: (90mm) 3.5-inch Gauge: 17G  H&P (Pre-op Assessment):  Lindsay Harris is a 48 y.o. (year old), female patient, seen today for interventional treatment. She  has a past surgical history that includes Colonoscopy with propofol  (N/A, 08/09/2023) and polypectomy (N/A, 08/09/2023). Lindsay Harris has a current medication list which includes the following prescription(s): albuterol , albuterol , azithromycin , vitamin d3, cyclobenzaprine, duloxetine , dupixent , orilissa , epinephrine, eszopiclone, furosemide, gabapentin , gabapentin , lactulose , linzess , montelukast , ondansetron , pantoprazole , propranolol , propranolol  er, rosuvastatin , simethicone, aerochamber mv, spiriva  handihaler, sumatriptan, symbicort , gemtesa , gemtesa , gemtesa , and vitamin d  (ergocalciferol ), and the following Facility-Administered Medications: fentanyl. Her primarily concern today is the Neck Pain  Initial Vital Signs:  Pulse/HCG Rate: (!) 101ECG Heart Rate: 95 Temp: (!) 97.5 F (36.4 C) Resp: 18 BP: (!) 119/54 SpO2: 100 % (O2 at 3 liters)  BMI: Estimated body mass index is 42.87 kg/m as calculated from the following:   Height as of this encounter: 5' 3 (1.6 m).   Weight as of this encounter: 242 lb (109.8 kg).  Risk Assessment: Allergies: Reviewed. She is allergic to amoxicillin.  Allergy Precautions: None required Coagulopathies: Reviewed. None identified.   Blood-thinner therapy: None at this time Active Infection(s): Reviewed. None identified. Lindsay Harris is afebrile  Site Confirmation: Lindsay Harris was asked to confirm the procedure and laterality before marking the site Procedure checklist: Completed Consent: Before the procedure and under the  influence of no sedative(s), amnesic(s), or anxiolytics, the patient was informed of the treatment options, risks and possible complications. To fulfill our ethical and legal obligations, as recommended by the American Medical Association's Code of Ethics, I have informed the patient of my clinical impression; the nature and purpose of the treatment or procedure; the risks, benefits, and possible complications of the intervention; the alternatives, including doing nothing; the risk(s) and benefit(s) of the alternative treatment(s) or procedure(s); and the risk(s) and benefit(s) of doing nothing. The patient was provided information about the general risks and possible complications associated with the procedure. These may include, but are not limited to: failure to achieve desired goals, infection, bleeding, organ or nerve damage, allergic reactions, paralysis, and death. In addition, the patient was informed of those risks and complications associated to Spine-related procedures, such as failure to decrease pain; infection (i.e.: Meningitis, epidural or intraspinal abscess); bleeding (i.e.: epidural hematoma, subarachnoid hemorrhage, or any other type of intraspinal or peri-dural bleeding); organ or nerve damage (i.e.: Any type of peripheral nerve, nerve root, or spinal cord injury) with subsequent damage to sensory, motor, and/or autonomic systems, resulting in permanent pain, numbness, and/or weakness of one or several areas of the body; allergic reactions; (i.e.: anaphylactic reaction); and/or death. Furthermore, the patient was informed of those risks and complications associated with the medications. These  include, but are not limited to: allergic reactions (i.e.: anaphylactic or anaphylactoid reaction(s)); adrenal axis suppression; blood sugar elevation that in diabetics may result in ketoacidosis or comma; water  retention that in patients with history of congestive heart failure may result in shortness of breath, pulmonary edema, and decompensation with resultant heart failure; weight gain; swelling or edema; medication-induced neural toxicity; particulate matter embolism and blood vessel occlusion with resultant organ, and/or nervous system infarction; and/or aseptic necrosis of one or more joints. Finally, the patient was informed that Medicine is not an exact science; therefore, there is also the possibility of unforeseen or unpredictable risks and/or possible complications that may result in a catastrophic outcome. The patient indicated having understood very clearly. We have given the patient no guarantees and we have made no promises. Enough time was given to the patient to ask questions, all of which were answered to the patient's satisfaction. Lindsay Harris has indicated that she wanted to continue with the procedure. Attestation: I, the ordering provider, attest that I have discussed with the patient the benefits, risks, side-effects, alternatives, likelihood of achieving goals, and potential problems during recovery for the procedure that I have provided informed consent. Date  Time: 10/09/2024 10:06 AM  Pre-Procedure Preparation:  Monitoring: As per clinic protocol. Respiration, ETCO2, SpO2, BP, heart rate and rhythm monitor placed and checked for adequate function Safety Precautions: Patient was assessed for positional comfort and pressure points before starting the procedure. Time-out: I initiated and conducted the Time-out before starting the procedure, as per protocol. The patient was asked to participate by confirming the accuracy of the Time Out information. Verification of the correct  person, site, and procedure were performed and confirmed by me, the nursing staff, and the patient. Time-out conducted as per Joint Commission's Universal Protocol (UP.01.01.01). Time: 1121 Start Time: 1121 hrs.  Description  Narrative of Procedure:          Rationale (medical necessity): procedure needed and proper for the diagnosis and/or treatment of the patient's medical symptoms and needs. Start Time: 1121 hrs. Safety Precautions: Aspiration looking for blood return was conducted prior to all injections. At no point did  we inject any substances, as a needle was being advanced. No attempts were made at seeking any paresthesias. Safe injection practices and needle disposal techniques used. Medications properly checked for expiration dates. SDV (single dose vial) medications used. Description of procedure: Protocol guidelines were followed. The patient was assisted into a comfortable position. The target area was identified and the area prepped in the usual manner. Skin & deeper tissues infiltrated with local anesthetic. Appropriate amount of time allowed to pass for local anesthetics to take effect. Using fluoroscopic guidance, the epidural needle was introduced through the skin, ipsilateral to the reported pain, and advanced to the target area. Posterior laminar os was contacted and the needle walked caudad, until the lamina was cleared. The ligamentum flavum was engaged and the epidural space identified using "loss-of-resistance technique" with 2-3 ml of PF-NaCl (0.9% NSS), in a 5cc dedicated LOR syringe. (See Imaging guidance below for use of contrast details.) Once proper needle placement was secured, and negative aspiration confirmed, the solution was injected in intermittent fashion, asking for systemic symptoms every 0.5cc. The needles were then removed and the area cleansed, making sure to leave some of the prepping solution back to take advantage of its long term bactericidal  properties.  Vitals:   10/09/24 1116 10/09/24 1121 10/09/24 1126 10/09/24 1137  BP: (!) 119/55 124/64 138/73 (!) 131/59  Pulse:      Resp: 20 18 (!) 21 10  Temp:    (!) 97.1 F (36.2 C)  TempSrc:    Temporal  SpO2: 100% 100% 100% 100%  Weight:      Height:         End Time: 1127 hrs.  Imaging Guidance (Spinal):          Type of Imaging Technique: Fluoroscopy Guidance (Spinal) Indication(s): Fluoroscopy guidance for needle placement to enhance accuracy in procedures requiring precise needle localization for targeted delivery of medication in or near specific anatomical locations not easily accessible without such real-time imaging assistance. Exposure Time: Please see nurses notes. Contrast: Before injecting any contrast, we confirmed that the patient did not have an allergy to iodine, shellfish, or radiological contrast. Once satisfactory needle placement was completed at the desired level, radiological contrast was injected. Contrast injected under live fluoroscopy. No contrast complications. See chart for type and volume of contrast used. Fluoroscopic Guidance: I was personally present during the use of fluoroscopy. Tunnel Vision Technique used to obtain the best possible view of the target area. Parallax error corrected before commencing the procedure. Direction-depth-direction technique used to introduce the needle under continuous pulsed fluoroscopy. Once target was reached, antero-posterior, oblique, and lateral fluoroscopic projection used confirm needle placement in all planes. Images permanently stored in EMR. Interpretation: I personally interpreted the imaging intraoperatively. Adequate needle placement confirmed in multiple planes. Appropriate spread of contrast into desired area was observed. No evidence of afferent or efferent intravascular uptake. No intrathecal or subarachnoid spread observed. Permanent images saved into the patient's record.  Post-operative Assessment:   Post-procedure Vital Signs:  Pulse/HCG Rate: (!) 10185 Temp: (!) 97.1 F (36.2 C) Resp: 10 BP: (!) 131/59 SpO2: 100 %  EBL: None  Complications: No immediate post-treatment complications observed by team, or reported by patient.  Note: The patient tolerated the entire procedure well. A repeat set of vitals were taken after the procedure and the patient was kept under observation following institutional policy, for this type of procedure. Post-procedural neurological assessment was performed, showing return to baseline, prior to discharge. The patient was provided  with post-procedure discharge instructions, including a section on how to identify potential problems. Should any problems arise concerning this procedure, the patient was given instructions to immediately contact us , at any time, without hesitation. In any case, we plan to contact the patient by telephone for a follow-up status report regarding this interventional procedure.  Comments:  No additional relevant information.  Plan of Care (POC)  Orders:  Orders Placed This Encounter  Procedures   Cervical Epidural Injection    Indication(s): Radiculitis and cervicalgia associated with cervical degenerative disc disease. Position: Prone Imaging guidance: Fluoroscopy required. Contrast required unless contraindicated by allergy or severe CKD. Equipment & Materials: Epidural tray & needle.    Scheduling Instructions:     Procedure: Cervical Epidural Steroid Injection/Block     Planned Level(s): C7-T1     Laterality: Right-sided     Anxiolysis: Patient's choice.     Date: 10/09/2024    Where will this procedure be performed?:   [facility]             by Dr. [facility] 1-60 MIN NO REPORT    Intraoperative interpretation by procedural physician at [facility].    Standing Status:   Standing    Number of Occurrences:   1    Reason for exam::   Assistance in needle guidance and placement  for procedures requiring needle placement in or near specific anatomical locations not easily accessible without such assistance.   Informed Consent Details: Physician/Practitioner Attestation; Transcribe to consent form and obtain patient signature    Nursing instructions: Transcribe to consent form and obtain patient signature. Always confirm laterality of pain with Lindsay Harris, before procedure.    Physician/Practitioner attestation of informed consent for procedure/surgical case:   I, the physician/practitioner, attest that I have discussed with the patient the benefits, risks, side effects, alternatives, likelihood of achieving goals and potential problems during recovery for the procedure that I have provided informed consent.    Procedure:   Cervical Epidural Steroid Injection (CESI) under fluoroscopic guidance    Physician/Practitioner performing the procedure:   Morrie Daywalt A. Tanya MD    Indication/Reason:   Indications: Cervicalgia (neck pain), cervical radicular pain, radiculitis (arm/shoulder pain, numbness, and/or weakness), degenerative disc disease, severe enough to greatly impact quality of life or function.   Provide equipment / supplies at bedside    Procedural tray: Epidural Tray (Disposable  single use) Skin infiltration needle: Regular 1.5-in, 25-G, (x1) Block needle size: Regular standard Catheter: No catheter required    Standing Status:   Standing    Number of Occurrences:   1    Specify:   Epidural Tray   Saline lock IV    Have LR [phone] mL available and administer at 125 mL/hr if patient becomes hypotensive.    Standing Status:   Standing    Number of Occurrences:   1     Opioid Analgesic: No chronic opioid analgesics therapy prescribed by our practice. None MME/day: 0 mg/day    Medications ordered for procedure: Meds ordered this encounter  Medications   iohexol  (OMNIPAQUE ) 180 MG/ML injection 10 mL    Must be Myelogram-compatible. If not available, you may  substitute with a water -soluble, non-ionic, hypoallergenic, myelogram-compatible radiological contrast medium.   lidocaine  (XYLOCAINE ) 2 % (with pres) injection 400 mg   pentafluoroprop-tetrafluoroeth (GEBAUERS) aerosol   midazolam  (VERSED ) 5 MG/5ML injection 0.5-2 mg    Make sure Flumazenil is available in the pyxis when using  this medication. If oversedation occurs, administer 0.2 mg IV over 15 sec. If after 45 sec no response, administer 0.2 mg again over 1 min; may repeat at 1 min intervals; not to exceed 4 doses (1 mg)   fentaNYL (SUBLIMAZE) injection 25-50 mcg    Make sure Narcan is available in the pyxis when using this medication. In the event of respiratory depression (RR< 8/min): Titrate NARCAN (naloxone) in increments of 0.1 to 0.2 mg IV at 2-3 minute intervals, until desired degree of reversal.   sodium chloride  flush (NS) 0.9 % injection 1 mL   ropivacaine  (PF) 2 mg/mL (0.2%) (NAROPIN ) injection 1 mL   dexamethasone  (DECADRON ) injection 10 mg   Medications administered: We administered iohexol , lidocaine , pentafluoroprop-tetrafluoroeth, midazolam , sodium chloride  flush, ropivacaine  (PF) 2 mg/mL (0.2%), and dexamethasone .  See the medical record for exact dosing, route, and time of administration.    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 x1 by Debby Amber, PA (11/30/2023) [facility])  Therapeutic left greater  trochanteric bursa inj. x1 by Debby Amber, PA (03/26/2024) ([facility])   1(Analgesic benefit): Expressed in percentage (%). (Local anesthetic[LA] +/- sedation  L.A.Local Anesthetic  Steroid benefit  Ongoing benefit)      Follow-up plan:   Return in about 2 weeks (around 10/23/2024) for (Face2F), (PPE).     Recent Visits Date Type Provider Dept  09/29/24 Office Visit Tanya Glisson, MD [facility]  09/09/24 Procedure visit Tanya Glisson, MD [facility]  08/20/24 Office Visit Tanya Glisson, MD [facility]  07/30/24 Office Visit Tanya Glisson, MD [facility]  Showing recent visits within past 90 days and meeting all other requirements Today's Visits Date Type Provider Dept  10/09/24 Procedure visit Tanya Glisson, MD [facility]  Showing today's visits and meeting all other requirements Future Appointments Date Type Provider Dept  10/29/24 Appointment Tanya Glisson, MD [facility]  Showing future appointments within next 90 days and meeting all other requirements   Disposition: Discharge home  Discharge (Date  Time): 10/09/2024;   hrs.   Primary Care Physician: Kayla Jeoffrey RAMAN, FNP Location: [facility] Note by: Glisson DELENA Tanya, MD (TTS technology used. I apologize for any typographical errors that were not detected and corrected.) Date: 10/09/2024; Time: 11:49 AM  Disclaimer:  Medicine is not an Visual merchandiser. The only guarantee in medicine is that nothing is guaranteed. It is important to note that the decision to proceed with this intervention was based on the information collected from the patient. The Data and conclusions were drawn from the patient's questionnaire, the interview, and the physical examination. Because the information was provided in large part by the patient, it cannot be guaranteed that it has not been purposely or unconsciously  manipulated. Every effort has been made to obtain as much relevant data as possible for this evaluation. It is important to note that the  conclusions that lead to this procedure are derived in large part from the available data. Always take into account that the treatment will also be dependent on availability of resources and existing treatment guidelines, considered by other Pain Management Practitioners as being common knowledge and practice, at the time of the intervention. For Medico-Legal purposes, it is also important to point out that variation in procedural techniques and pharmacological choices are the acceptable norm. The indications, contraindications, technique, and results of the above procedure should only be interpreted and judged by a Board-Certified Interventional Pain Specialist with extensive familiarity and expertise in the same exact procedure and technique.

## 2024-10-10 ENCOUNTER — Telehealth: Payer: Self-pay | Admitting: *Deleted

## 2024-10-10 NOTE — Telephone Encounter (Signed)
 Post procedure call:   no  questions or concerns.

## 2024-10-13 ENCOUNTER — Telehealth: Payer: Self-pay

## 2024-10-13 NOTE — Telephone Encounter (Signed)
 Prior auth sent to [employer] via cover my meds for gemtesa  coverage, waiting on response/

## 2024-10-14 NOTE — Telephone Encounter (Signed)
 PA was denied, sent appeal via cover my meds, waiting on determination.

## 2024-10-18 DIAGNOSIS — Z9189 Other specified personal risk factors, not elsewhere classified: Secondary | ICD-10-CM

## 2024-10-18 DIAGNOSIS — Z1371 Encounter for nonprocreative screening for genetic disease carrier status: Secondary | ICD-10-CM

## 2024-10-18 HISTORY — DX: Encounter for nonprocreative screening for genetic disease carrier status: Z13.71

## 2024-10-18 HISTORY — DX: Other specified personal risk factors, not elsewhere classified: Z91.89

## 2024-10-20 ENCOUNTER — Other Ambulatory Visit: Payer: Self-pay | Admitting: Pharmacy Technician

## 2024-10-20 ENCOUNTER — Other Ambulatory Visit (HOSPITAL_COMMUNITY): Payer: Self-pay

## 2024-10-20 ENCOUNTER — Other Ambulatory Visit: Payer: Self-pay

## 2024-10-20 ENCOUNTER — Encounter (INDEPENDENT_AMBULATORY_CARE_PROVIDER_SITE_OTHER): Payer: Self-pay

## 2024-10-20 NOTE — Progress Notes (Signed)
 Specialty Pharmacy Refill Coordination Note  Lindsay Harris is a 47 y.o. female contacted today regarding refills of specialty medication(s) Dupilumab  (Dupixent )   Patient requested (Patient-Rptd) Delivery   Delivery date: 11/05/2024 Verified address: (Patient-Rptd) [address] [address]   Medication will be filled on: 11/04/2024

## 2024-10-21 ENCOUNTER — Other Ambulatory Visit: Payer: Self-pay | Admitting: Medical Genetics

## 2024-10-21 DIAGNOSIS — Z006 Encounter for examination for normal comparison and control in clinical research program: Secondary | ICD-10-CM

## 2024-10-22 ENCOUNTER — Other Ambulatory Visit

## 2024-10-22 DIAGNOSIS — E1169 Type 2 diabetes mellitus with other specified complication: Secondary | ICD-10-CM

## 2024-10-22 DIAGNOSIS — E118 Type 2 diabetes mellitus with unspecified complications: Secondary | ICD-10-CM

## 2024-10-23 LAB — COMPREHENSIVE METABOLIC PANEL WITH GFR
AG Ratio: 1.5 (calc) (ref 1.0–2.5)
ALT: 49 U/L — ABNORMAL HIGH (ref 6–29)
AST: 31 U/L (ref 10–35)
Albumin: 4.1 g/dL (ref 3.6–5.1)
Alkaline phosphatase (APISO): 104 U/L (ref 31–125)
BUN: 10 mg/dL (ref 7–25)
CO2: 26 mmol/L (ref 20–32)
Calcium: 9.2 mg/dL (ref 8.6–10.2)
Chloride: 104 mmol/L (ref 98–110)
Creat: 0.83 mg/dL (ref 0.50–0.99)
Globulin: 2.7 g/dL (ref 1.9–3.7)
Glucose, Bld: 146 mg/dL — ABNORMAL HIGH (ref 65–99)
Potassium: 4.2 mmol/L (ref 3.5–5.3)
Sodium: 139 mmol/L (ref 135–146)
Total Bilirubin: 0.3 mg/dL (ref 0.2–1.2)
Total Protein: 6.8 g/dL (ref 6.1–8.1)
eGFR: 87 mL/min/1.73m2 (ref >=60)

## 2024-10-23 LAB — LIPID PANEL
Cholesterol: 216 mg/dL — ABNORMAL HIGH (ref <200)
HDL: 48 mg/dL — ABNORMAL LOW (ref >=50)
LDL Cholesterol (Calc): 139 mg/dL — ABNORMAL HIGH
Non-HDL Cholesterol (Calc): 168 mg/dL — ABNORMAL HIGH (ref <130)
Total CHOL/HDL Ratio: 4.5 (calc) (ref <5.0)
Triglycerides: 159 mg/dL — ABNORMAL HIGH (ref <150)

## 2024-10-27 ENCOUNTER — Ambulatory Visit: Admitting: Family Medicine

## 2024-10-27 ENCOUNTER — Encounter: Payer: Self-pay | Admitting: Family Medicine

## 2024-10-27 ENCOUNTER — Ambulatory Visit: Payer: Self-pay | Admitting: Family Medicine

## 2024-10-27 ENCOUNTER — Telehealth: Payer: Self-pay | Admitting: Pharmacy Technician

## 2024-10-27 ENCOUNTER — Other Ambulatory Visit (HOSPITAL_COMMUNITY): Payer: Self-pay

## 2024-10-27 VITALS — BP 127/82 | HR 76 | Temp 97.8°F | Ht 63.0 in | Wt 242.2 lb

## 2024-10-27 DIAGNOSIS — M5441 Lumbago with sciatica, right side: Secondary | ICD-10-CM

## 2024-10-27 DIAGNOSIS — J449 Chronic obstructive pulmonary disease, unspecified: Secondary | ICD-10-CM

## 2024-10-27 DIAGNOSIS — Z23 Encounter for immunization: Secondary | ICD-10-CM

## 2024-10-27 DIAGNOSIS — F411 Generalized anxiety disorder: Secondary | ICD-10-CM

## 2024-10-27 DIAGNOSIS — E1169 Type 2 diabetes mellitus with other specified complication: Secondary | ICD-10-CM

## 2024-10-27 DIAGNOSIS — E66813 Obesity, class 3: Secondary | ICD-10-CM

## 2024-10-27 DIAGNOSIS — K219 Gastro-esophageal reflux disease without esophagitis: Secondary | ICD-10-CM | POA: Diagnosis not present

## 2024-10-27 DIAGNOSIS — G8929 Other chronic pain: Secondary | ICD-10-CM

## 2024-10-27 DIAGNOSIS — G4733 Obstructive sleep apnea (adult) (pediatric): Secondary | ICD-10-CM

## 2024-10-27 DIAGNOSIS — E0590 Thyrotoxicosis, unspecified without thyrotoxic crisis or storm: Secondary | ICD-10-CM

## 2024-10-27 DIAGNOSIS — K589 Irritable bowel syndrome without diarrhea: Secondary | ICD-10-CM

## 2024-10-27 DIAGNOSIS — F17200 Nicotine dependence, unspecified, uncomplicated: Secondary | ICD-10-CM

## 2024-10-27 DIAGNOSIS — R7989 Other specified abnormal findings of blood chemistry: Secondary | ICD-10-CM

## 2024-10-27 DIAGNOSIS — E118 Type 2 diabetes mellitus with unspecified complications: Secondary | ICD-10-CM

## 2024-10-27 MED ORDER — OZEMPIC (0.25 OR 0.5 MG/DOSE) 2 MG/3ML ~~LOC~~ SOPN
0.2500 mg | PEN_INJECTOR | SUBCUTANEOUS | 0 refills | Status: AC
Start: 2024-10-27 — End: ?

## 2024-10-27 MED ORDER — EZETIMIBE 10 MG PO TABS: 10.0000 mg | ORAL_TABLET | Freq: Every day | ORAL | 3 refills | Status: AC

## 2024-10-27 NOTE — Progress Notes (Unsigned)
 PROVIDER NOTE: Interpretation of information contained herein should be left to medically-trained personnel. Specific patient instructions are provided elsewhere under Patient Instructions section of medical record. This document was created in part using AI and STT-dictation technology, any transcriptional errors that may result from this process are unintentional.  Patient: Lindsay Harris  Service: E/M   PCP: Kayla Jeoffrey RAMAN, FNP  DOB: 1975-12-29  DOS: 10/29/2024  Provider: Eric DELENA Como, MD  MRN: [id number]  Delivery: Face-to-face  Specialty: Interventional Pain Management  Type: Established Patient  Setting: Ambulatory outpatient facility  Specialty designation: 09  Referring Prov.: Kayla Jeoffrey RAMAN, FNP  Location: Outpatient office facility       History of present illness (HPI) [name], a 48 y.o. year old female, is here today because of her Cervical neck pain with evidence of disc disease [M50.90]. Lindsay Harris primary complain today is Shoulder Pain (right)  Pertinent problems: Lindsay Harris has Cervical neck pain with evidence of disc disease; Disc degeneration, lumbar; Migraines; Chronic pain syndrome; Chronic low back pain (Midline) (1ry area of Pain) w/ sciatica (Right); Chronic lower extremity pain (Right); Cervicalgia; Chronic neck pain (Bilateral); Chronic shoulder pain (Bilateral) (R>L); Chronic lower extremity pain (Right); Cervical radiculitis (Right); Chronic low back pain (Bilateral) w/o sciatica; Low back pain of over 3 months duration; Low back pain radiating to both legs; Multifactorial low back pain; Chronic hip pain (Bilateral); Chronic lower extremity pain (Bilateral); Neck pain of over 3 months duration; Neck and shoulder pain; Posterior neck pain; Chronic neck and back pain; and DDD (degenerative disc disease), cervical on their pertinent problem list.  Pain Assessment: Severity of Chronic pain is reported as a 7 /10. Location: Shoulder Right/radiates down  right arm to fingertips. Onset: More than a month ago. Quality: Throbbing. Timing: Constant. Modifying factor(s): denies. Vitals:  height is 5' 3 (1.6 m) and weight is 241 lb (109.3 kg). Her temperature is 97.2 F (36.2 C) (abnormal). Her blood pressure is 121/88 and her pulse is 91. Her oxygen saturation is 98%.  BMI: Estimated body mass index is 42.69 kg/m as calculated from the following:   Height as of this encounter: 5' 3 (1.6 m).   Weight as of this encounter: 241 lb (109.3 kg).  Last encounter: 09/29/2024. Last procedure: 10/09/2024.  Reason for encounter: post-procedure evaluation and assessment.   Discussed the use of AI scribe software for clinical note transcription with the patient, who gave verbal consent to proceed.  History of Present Illness   Lindsay Harris is a 48 year old female who presents for follow-up after a right-sided C7-T1 cervical epidural steroid injection.  She received her second right-sided C7-T1 cervical epidural steroid injection on October 09, 2024. She initially experienced 100% pain relief for the first hour, followed by 80% relief for the next four to six hours, with no relief after six hours. The initial complaint was neck pain and right upper extremity pain.  Numbness lasted several hours, longer than after the first injection. Pain began to return a couple of days ago, described as a stabbing sensation localized to the neck area, specifically between the neck and ear. Arm pain has not returned.  She experienced approximately 70% improvement in symptoms until attempting housework, which significantly exacerbated the pain. She is not currently taking any blood thinners. She is actively working on reducing her smoking.      Post-Procedure Evaluation   Type: Cervical Epidural Steroid injection (CESI) (Interlaminar) #2  Laterality: Right (-RT)  Level:  C7-T1 DOS: 10/09/2024  Provider: Eric DELENA Como, MD Imaging: Fluoroscopy-guided Spinal  [phone]) Anesthesia: Local anesthesia (1-2% Lidocaine ) Anxiolysis: IV Versed  3.0 mg Sedation: Minimal Sedation None required. No Fentanyl administered.          Medical Necessity Purpose: Diagnostic/Therapeutic Rationale (medical necessity): procedure needed and proper for the diagnosis and/or treatment of Lindsay Harris's medical symptoms and needs. Indications: Cervicalgia, cervical radicular pain, degenerative disc disease, severe enough to impact quality of life or function. 1. Cervical neck pain with evidence of disc disease   2. Cervical radiculitis (Right)   3. Cervicalgia   4. Chronic neck and back pain   5. DDD (degenerative disc disease), cervical   6. Neck and shoulder pain   7. Neck pain of over 3 months duration   8. Posterior neck pain    NAS-11 Pain score:   Pre-procedure: 7 /10   Post-procedure: 0-No pain/10     Effectiveness:  Initial hour after procedure: 100 %. Subsequent 4-6 hours post-procedure: 100 %. Analgesia past initial 6 hours: 80-100 %. Ongoing improvement:  Analgesic: Initially the patient had told the nurse that she attained 100% relief of the pain for the initial hour and 80% for the following 4 to 6 hours but upon further questioning it turns out that she had 100% relief of the pain that lasted for the duration of the local anesthetic and in fact the right upper extremity pain that she was experiencing (cervical radiculitis) has gone away and has not returned therefore she has attained 100% ongoing relief of the cervical radiculitis.  She points at the area of the C7-T1 spinous process as the area where she has currently having pain and she admits that it was due to the fact that she started to do more work since she felt better.  She states having done fairly well with an 80% relief of the pain that lasted until recently when this last spinous process pain came back. Function: Lindsay Harris reports improvement in function ROM: Lindsay Harris reports improvement  in ROM  Pharmacotherapy Assessment   Opioid Analgesic: No chronic opioid analgesics therapy prescribed by our practice. None MME/day: 0 mg/day   Monitoring: [place] PMP: PDMP reviewed during this encounter.       Pharmacotherapy: No side-effects or adverse reactions reported. Compliance: No problems identified. Effectiveness: Clinically acceptable.  Margrette Nathanel PARAS, RN  10/29/2024  3:10 PM  Sign when Signing Visit Safety precautions to be maintained throughout the outpatient stay will include: orient to surroundings, keep bed in low position, maintain call bell within reach at all times, provide assistance with transfer out of bed and ambulation.     UDS:  Summary  Date Value Ref Range Status  07/30/2024 FINAL  Final    Comment:    ==================================================================== Compliance Drug Analysis, Ur ==================================================================== Test                             Result       Flag       Units  Drug Present   Carboxy-THC                    81                      ng/mg creat    Carboxy-THC is a metabolite of tetrahydrocannabinol (THC). Source of    THC is most commonly herbal marijuana or marijuana-based products,  but THC is also present in a scheduled prescription medication.    Trace amounts of THC can be present in hemp and cannabidiol (CBD)    products. This test is not intended to distinguish between delta-9-    tetrahydrocannabinol, the predominant form of THC in most herbal or    marijuana-based products, and delta-8-tetrahydrocannabinol.    Gabapentin                      PRESENT   Cyclobenzaprine                PRESENT   Desmethylcyclobenzaprine       PRESENT    Desmethylcyclobenzaprine is an expected metabolite of    cyclobenzaprine.    Duloxetine                      PRESENT   Propranolol                     PRESENT ==================================================================== Test                       Result    Flag   Units      Ref Range   Creatinine              187              mg/dL      >=79 ==================================================================== Declared Medications:  Medication list was not provided. ==================================================================== For clinical consultation, please call [phone]. ====================================================================     No results found for: CBDTHCR No results found for: D8THCCBX No results found for: D9THCCBX  ROS  Constitutional: Denies any fever or chills Gastrointestinal: No reported hemesis, hematochezia, vomiting, or acute GI distress Musculoskeletal: Denies any acute onset joint swelling, redness, loss of ROM, or weakness Neurological: No reported episodes of acute onset apraxia, aphasia, dysarthria, agnosia, amnesia, paralysis, loss of coordination, or loss of consciousness  Medication Review  AeroChamber MV, B-12, DULoxetine , Dupilumab , EPINEPHrine, Elagolix Sodium , SUMAtriptan, Semaglutide(0.25 or 0.5MG /DOS), Simethicone, Tiotropium Bromide , Vibegron , Vitamin D  (Ergocalciferol ), Vitamin D3, albuterol , budesonide -formoterol , cyclobenzaprine, eszopiclone, ezetimibe, furosemide, gabapentin , lactulose , linaclotide , metFORMIN, montelukast , ondansetron , pantoprazole , propranolol , propranolol  ER, and rosuvastatin   History Review  Allergy: Lindsay Harris is allergic to amoxicillin. Drug: Lindsay Harris  reports no history of drug use. Alcohol:  reports no history of alcohol use. Tobacco:  reports that she has been smoking cigarettes. She started smoking about 38 years ago. She has a 38.9 pack-year smoking history. She does not have any smokeless tobacco history on file. Social: Lindsay Harris  reports that she has been smoking cigarettes. She started smoking about 38 years ago. She has a 38.9 pack-year smoking history. She does not have any smokeless tobacco history on file. She reports  that she does not drink alcohol and does not use drugs. Medical:  has a past medical history of Asthma (03/1987), COPD (chronic obstructive pulmonary disease) (HCC), Dyspnea, GERD (gastroesophageal reflux disease), Headache, Plantar fascia syndrome (09/24/2024), and Thyroid disease ([id number]). Surgical: Lindsay Harris  has a past surgical history that includes Colonoscopy with propofol  (N/A, 08/09/2023) and polypectomy (N/A, 08/09/2023). Family: family history includes Anemia in her mother; Asthma in her maternal grandfather and mother; Breast cancer in her maternal grandmother; Colon cancer in her maternal uncle; Colon cancer (age of onset: 13) in her mother; Heart attack in her maternal grandfather; Heart disease in her maternal grandfather; Heart failure in her maternal grandfather; Hypertension in her maternal  grandfather; Ovarian cancer (age of onset: 70) in her sister.  Laboratory Chemistry Profile   Renal Lab Results  Component Value Date   BUN 10 10/22/2024   CREATININE 0.83 10/22/2024   LABCREA 116 08/05/2024   BCR SEE NOTE: 10/22/2024   GFRAA >60 12/30/2015   GFRNONAA >60 05/08/2024    Hepatic Lab Results  Component Value Date   AST 31 10/22/2024   ALT 49 (H) 10/22/2024   ALBUMIN 3.8 (L) 07/30/2024   ALKPHOS 114 07/30/2024   LIPASE 38 06/07/2023    Electrolytes Lab Results  Component Value Date   NA 139 10/22/2024   K 4.2 10/22/2024   CL 104 10/22/2024   CALCIUM  9.2 10/22/2024   MG 2.1 07/30/2024    Bone Lab Results  Component Value Date   VD25OH 18 (L) 10/27/2024   25OHVITD1 20 (L) 07/30/2024   25OHVITD2 <1.0 07/30/2024   25OHVITD3 20 07/30/2024    Inflammation (CRP: Acute Phase) (ESR: Chronic Phase) Lab Results  Component Value Date   CRP 12 (H) 07/30/2024   ESRSEDRATE 44 (H) 07/30/2024   LATICACIDVEN 1.4 08/23/2022         Note: Above Lab results reviewed.  Recent Imaging Review  DG PAIN CLINIC C-ARM 1-60 MIN NO REPORT Fluoro was used, but no Radiologist  interpretation will be provided.  Please refer to NOTES tab for provider progress note. Note: Reviewed        Physical Exam  Vitals: BP 121/88   Pulse 91   Temp (!) 97.2 F (36.2 C)   Ht 5' 3 (1.6 m)   Wt 241 lb (109.3 kg)   LMP 08/19/2023 (Approximate)   SpO2 98% Comment: on 3L/[place]  BMI 42.69 kg/m  BMI: Estimated body mass index is 42.69 kg/m as calculated from the following:   Height as of this encounter: 5' 3 (1.6 m).   Weight as of this encounter: 241 lb (109.3 kg). Ideal: Ideal body weight: 52.4 kg (115 lb 8.3 oz) Adjusted ideal body weight: 75.2 kg (165 lb 11.4 oz) General appearance: Well nourished, well developed, and well hydrated. In no apparent acute distress Mental status: Alert, oriented x 3 (person, place, & time)       Respiratory: No evidence of acute respiratory distress Eyes: PERLA   Assessment   Diagnosis Status  1. Cervical neck pain with evidence of disc disease   2. Cervical radiculitis (Right)   3. Cervicalgia   4. Chronic neck and back pain   5. Postop check    Improving Resolved Persistent   Updated Problems: No problems updated.  Plan of Care  Problem-specific:  Assessment and Plan    Cervical disc disorder with right-sided radiculopathy and cervicalgia   The right-sided C71 cervical epidural steroid injection initially provided 100% relief for the first hour, 80% relief for the next four to six hours, and no relief after six hours. Initial relief was 80% better for four to six hours, but stabbing neck pain continued, especially during housework. Arm pain has not returned. The second injection provided longer relief, indicating reduced inflammation. A third injection is planned to further reduce inflammation and improve symptoms. Ordered third right-sided C71 cervical epidural steroid injection with sedation.  Nicotine  dependence   She has significantly reduced smoking. Nicotine  damages blood vessels, leading to degenerative disc disease  and cardiovascular issues. Complete cessation of nicotine  is advised to prevent further vascular damage. Advised complete cessation of nicotine  in all forms.       Ms. Keiera  K Harris has a current medication list which includes the following long-term medication(s): albuterol , albuterol , duloxetine , dupixent , eszopiclone, ezetimibe, furosemide, linzess , metformin, montelukast , pantoprazole , propranolol , propranolol  er, rosuvastatin , simethicone, spiriva  handihaler, sumatriptan, and symbicort .  Pharmacotherapy (Medications Ordered): No orders of the defined types were placed in this encounter.  Orders:  Orders Placed This Encounter  Procedures   Cervical Epidural Injection    Sedation: Patient's choice. Purpose: Therapeutic Indication(s): Radiculitis and cervicalgia associater with cervical degenerative disc disease.    Standing Status:   Future    Expiration Date:   01/29/2025    Scheduling Instructions:     Procedure: Cervical Epidural Steroid Injection/Block     Level(s): C7-T1     Laterality: Right-sided     Timeframe: ASAP    Where will this procedure be performed?:   [facility]             by Dr. Tanya   Nursing Instructions:    Please complete this patient's postprocedure evaluation.    Scheduling Instructions:     Please complete this patient's postprocedure evaluation.     Interventional Therapies  Risk Factors  Considerations  Medical Comorbidities:  (07/30/2024) UDS (+) carboxy-THC  MO (BMI>40)  COPD on O2 @ 3 L/min  Smoker  GERD  IBS  OSA  GAD  T2NIDDM  HTN     Planned  Pending:   Diagnostic/therapeutic right cervical ESI #3    Under consideration:   Diagnostic/therapeutic right cervical ESI #3    Completed: (Analgesic benefit)1  Diagnostic/therapeutic right cervical ESI x2 (10/09/2024) (1st:100/50/0/0) (2nd:100/100/80-100/RUE:100Neck:40)   Therapeutic  Palliative (PRN) options:   None established   Completed by other providers:    Therapeutic right greater trochanteric bursa inj. x1 by Debby Amber, PA (11/30/2023) [facility])  Therapeutic left greater trochanteric bursa inj. x1 by Debby Amber, PA (03/26/2024) ([facility])   1(Analgesic benefit): Expressed in percentage (%). (Local anesthetic[LA] +/- sedation  L.A.Local Anesthetic  Steroid benefit  Ongoing benefit)     Return for (ECT):(R) CESI #3.    Recent Visits Date Type Provider Dept  10/09/24 Procedure visit Tanya Glisson, MD [facility]  09/29/24 Office Visit Tanya Glisson, MD [facility]  09/09/24 Procedure visit Tanya Glisson, MD [facility]  08/20/24 Office Visit Tanya Glisson, MD [facility]  Showing recent visits within past 90 days and meeting all other requirements Today's Visits Date Type Provider Dept  10/29/24 Office Visit Tanya Glisson, MD [facility]  Showing today's visits and meeting all other requirements Future Appointments Date Type Provider Dept  11/06/24 Appointment Tanya Glisson, MD [facility]  Showing future appointments within next 90 days and meeting all other requirements  I discussed the assessment and treatment plan with the patient. The patient was provided an opportunity to ask questions and all were answered. The patient agreed with the plan and demonstrated an understanding of the instructions.  Patient advised to call back or seek an in-person evaluation if the symptoms or condition worsens.  Duration of encounter: 30 minutes.  Total time on encounter, as per AMA guidelines included both the face-to-face and non-face-to-face time personally spent by the physician and/or other qualified health care professional(s) on the day of the encounter (includes time in activities that require the physician or other qualified health care professional and does not include time in activities normally performed by clinical staff).    Note by: Glisson DELENA Tanya, MD (TTS and AI technology used. I  apologize for any typographical errors that were not detected and corrected.) Date: 10/29/2024; Time: 3:45 PM

## 2024-10-27 NOTE — Progress Notes (Signed)
 Acute Office Visit  Patient ID: Lindsay Harris, female    DOB: 1976-12-13, 48 y.o.   MRN: [id number]  PCP: Kayla Jeoffrey RAMAN, FNP  Chief Complaint  Patient presents with   Medical Management of Chronic Issues    3 month f/u  Sharp, tender to the touch hip pain/ the right hurts more than the left.      Subjective:     HPI  Discussed the use of AI scribe software for clinical note transcription with the patient, who gave verbal consent to proceed.  Ms Emmerich here today for chronic condition management. PMH includes palpitations and tachycardia, COPD, asthma, smoker, urinary incontinence, low back pain from herniated discs, DDD, bursitis of her right hip with referral to pain clinic authorized, seizure, IBS-C, anxiety and depression, and arthritis. Sees OBGYN for ovarian cysts, endometriosis, and PCOS. Followed by Rheumatology, Cardiology, Pulmonology, Urology, Pain Management, Gastroenterology, Podiatry, OBGYN, and Ortho Surgery.    Anxiety: uncontrolled on Duloxetine  60mg  daily, has tried Zoloft, would like psychiatry referral, panic attacks   Asthma/COPD: on albuterol , dupixent , spiriva , symbicort ; continuous O2 portable  DM: A1c 6.8%, FBG at home 150s, denies polyuria, chest pain, paresthesias, or chest pain. Endorses polydypsia.   HLD: Rosuvastatin  40mg  daily, compliant, poor diet, exercise is limited by pulm but she is doing chair exercises. Denies chest pain.   Vitamin D : on Vitamin D  5000 units daily   COPD: continuous O2, Symbicort  BID, Spiriva  daily, Albuterol  PRN, Dupixent , pulm rehab, advised to quit smoking   OSA: on CPAP   Nicotine  dependence: declines medication   Urinary incontinence: followed by Urology, on Gemtesa    Chronic back pain: has established with chronic pain; has CESI,    IBS-C: on Linzess  and Lactulose , followed by GI   History of Present Illness Lindsay Harris is a 48 year old female who presents for follow-up of chronic pain management  and weight concerns.  She experiences ongoing pain despite previous treatments, including pain medication and steroid injections, which have not provided relief. She has recently established care with urology and pain management doctors. She is on Dupixent , though she is unsure of the dose, and it is stored in the refrigerator. She is also taking Orlisa for gynecological health, with a recent normal Pap smear. For urological health, she is on Gemteza and reports no issues.  Her cholesterol management includes rosuvastatin  40 mg, which she has been taking for at least six weeks. She has lost thirteen pounds recently without changes in diet or physical activity. She describes her diet as generally healthy, avoiding high sodium and red meat, but expresses frustration with her weight gain over the past two years despite these efforts.  She has been on vitamin D  5000 units and B12 for two to three months, as her levels have been persistently abnormal. No history of pancreatitis, multiple endocrine neoplasia, or medullary thyroid cancer in herself or her family.  No chest pain, palpitations, vision changes, headaches, shortness of breath, or wheezing beyond her baseline. She is due for a flu shot, as it has been over two weeks since her last steroid injection.   Review of Systems  All other systems reviewed and are negative.   Past Medical History:  Diagnosis Date   Asthma 03/1987   COPD (chronic obstructive pulmonary disease) (HCC)    Dyspnea    GERD (gastroesophageal reflux disease)    Headache    migraines   Plantar fascia syndrome 09/24/2024   B/L   Thyroid disease  [id number]    Past Surgical History:  Procedure Laterality Date   COLONOSCOPY WITH PROPOFOL  N/A 08/09/2023   Procedure: COLONOSCOPY WITH BIOPSY;  Surgeon: Unk Corinn Skiff, MD;  Location: [facility];  Service: Endoscopy;  Laterality: N/A;   POLYPECTOMY N/A 08/09/2023   Procedure: POLYPECTOMY;  Surgeon: Unk Corinn Skiff, MD;  Location: [facility];  Service: Endoscopy;  Laterality: N/A;    Current Outpatient Medications on File Prior to Visit  Medication Sig Dispense Refill   albuterol  (VENTOLIN  HFA) 108 (90 Base) MCG/ACT inhaler Inhale 2 puffs into the lungs every 6 (six) hours as needed. 3 each 3   Cyanocobalamin (B-12) 5000 MCG CAPS Take 1 tablet by mouth daily.     cyclobenzaprine (FLEXERIL) 5 MG tablet Take 5 mg by mouth 3 (three) times daily as needed for muscle spasms.     furosemide (LASIX) 20 MG tablet Take 20 mg by mouth daily as needed.     gabapentin  (NEURONTIN ) 100 MG capsule Take 400 mg by mouth 2 (two) times daily. Take 400mg  in the morning and 400mg  in the afternoon.     gabapentin  (NEURONTIN ) 600 MG tablet Take 600 mg by mouth at bedtime.     linaclotide  (LINZESS ) 290 MCG CAPS capsule TAKE 1 CAPSULE BY MOUTH DAILY BEFORE BREAKFAST. 90 capsule 0   pantoprazole  (PROTONIX ) 40 MG tablet TAKE 1 TABLET BY MOUTH EVERY DAY FOR HEARTBURN, TAKE 1 TAB TWICE DAILY FOR THE FIRST 2 WEEK 90 tablet 2   propranolol  (INDERAL ) 20 MG tablet TAKE 1 TABLET BY MOUTH 3 TIMES DAILY AS NEEDED. 270 tablet 1   rosuvastatin  (CRESTOR ) 40 MG tablet Take 1 tablet (40 mg total) by mouth daily. 90 tablet 1   SUMAtriptan (IMITREX) 25 MG tablet Take 25 mg by mouth every 2 (two) hours as needed.     SYMBICORT  160-4.5 MCG/ACT inhaler INHALE 2 PUFFS INTO THE LUNGS IN THE MORNING AND AT BEDTIME. 30.6 each 4   Vitamin D , Ergocalciferol , (DRISDOL ) 1.25 MG (50000 UNIT) CAPS capsule Take by mouth.     albuterol  (PROVENTIL ) (2.5 MG/3ML) 0.083% nebulizer solution Take 2.5 mg by nebulization every 4 (four) hours as needed.     Cholecalciferol (VITAMIN D3) 125 MCG (5000 UT) CAPS Take 1 capsule (5,000 Units total) by mouth daily with breakfast. Take along with calcium  and magnesium . 90 capsule 0   cyclobenzaprine (FLEXERIL) 5 MG tablet Take 1 tablet (5 mg total) by mouth 3 (three) times daily as needed for muscle spasms. 90  tablet 3   DULoxetine  (CYMBALTA ) 60 MG capsule Take 60 mg by mouth 2 (two) times daily.     Dupilumab  (DUPIXENT ) 300 MG/2ML SOAJ Inject 300 mg into the skin every 14 (fourteen) days. 12 mL 1   Elagolix Sodium  (ORILISSA ) 200 MG TABS Take 1 tablet (200 mg total) by mouth 2 (two) times daily. 60 tablet 12   EPINEPHrine 0.3 mg/0.3 mL IJ SOAJ injection Inject 0.3 mg into the muscle as needed for anaphylaxis.     eszopiclone (LUNESTA) 1 MG TABS tablet Take 1 mg by mouth at bedtime as needed.     lactulose  (CHRONULAC ) 10 GM/15ML solution Take 30 mLs (20 g total) by mouth daily. 900 mL 3   montelukast  (SINGULAIR ) 10 MG tablet Take 10 mg by mouth at bedtime.     ondansetron  (ZOFRAN -ODT) 4 MG disintegrating tablet Take 1 tablet (4 mg total) by mouth every 8 (eight) hours as needed for nausea. 30 tablet 0   propranolol  ER (  INDERAL  LA) 120 MG 24 hr capsule Take 120 mg by mouth daily.     Simethicone 180 MG CAPS Take 1 capsule by mouth 3 (three) times daily as needed.     Spacer/Aero-Holding Chambers (AEROCHAMBER MV) inhaler Use as instructed 1 each 0   SPIRIVA  HANDIHALER 18 MCG inhalation capsule Place 1 capsule (18 mcg total) into inhaler and inhale daily. 90 capsule 3   Vibegron  (GEMTESA ) 75 MG TABS Take 1 tablet (75 mg total) by mouth daily. 42 tablet    Vibegron  (GEMTESA ) 75 MG TABS Take 1 tablet (75 mg total) by mouth daily. 30 tablet 11   No current facility-administered medications on file prior to visit.    Allergies  Allergen Reactions   Amoxicillin Anaphylaxis and Hives       Objective:    BP 127/82   Pulse 76   Temp 97.8 F (36.6 C)   Ht 5' 3 (1.6 m)   Wt 242 lb 3.2 oz (109.9 kg)   LMP 08/19/2023 (Approximate)   SpO2 96%   BMI 42.90 kg/m  BP Readings from Last 3 Encounters:  10/27/24 127/82  10/09/24 (!) 117/47  10/08/24 121/82   Wt Readings from Last 3 Encounters:  10/27/24 242 lb 3.2 oz (109.9 kg)  10/09/24 242 lb (109.8 kg)  10/08/24 243 lb 12.8 oz (110.6 kg)       Physical Exam Vitals and nursing note reviewed.  Constitutional:      Appearance: Normal appearance. She is obese.  HENT:     Head: Normocephalic and atraumatic.  Cardiovascular:     Rate and Rhythm: Normal rate and regular rhythm.     Pulses: Normal pulses.     Heart sounds: Normal heart sounds.  Pulmonary:     Effort: Pulmonary effort is normal.     Breath sounds: Wheezing present.  Skin:    General: Skin is warm and dry.  Neurological:     General: No focal deficit present.     Mental Status: She is alert and oriented to person, place, and time. Mental status is at baseline.  Psychiatric:        Mood and Affect: Mood normal.        Behavior: Behavior normal.        Thought Content: Thought content normal.        Judgment: Judgment normal.       No results found for any visits on 10/27/24.     Assessment & Plan:   Problem List Items Addressed This Visit     Tobacco dependence (Chronic)   Generalized anxiety disorder   Hyperthyroidism   COPD (chronic obstructive pulmonary disease) (HCC) - Primary (Chronic)   GERD (gastroesophageal reflux disease) (Chronic)   Obesity, Class III, BMI 40-49.9 (morbid obesity) (HCC) (Chronic)   Relevant Medications   Semaglutide,0.25 or 0.5MG /DOS, (OZEMPIC, 0.25 OR 0.5 MG/DOSE,) 2 MG/3ML SOPN   OSA (obstructive sleep apnea) (Chronic)   Chronic low back pain (Midline) (1ry area of Pain) w/ sciatica (Right) (Chronic)   Relevant Medications   cyclobenzaprine (FLEXERIL) 5 MG tablet   IBS (irritable bowel syndrome) (Chronic)   Controlled type 2 diabetes mellitus with complication, without long-term current use of insulin  (HCC)   Relevant Medications   Semaglutide,0.25 or 0.5MG /DOS, (OZEMPIC, 0.25 OR 0.5 MG/DOSE,) 2 MG/3ML SOPN   Other Relevant Orders   Hemoglobin A1c   Hyperlipidemia associated with type 2 diabetes mellitus (HCC)   Relevant Medications   Semaglutide,0.25 or 0.5MG /DOS, (OZEMPIC, 0.25 OR 0.5  MG/DOSE,) 2 MG/3ML SOPN    ezetimibe (ZETIA) 10 MG tablet   Low serum vitamin D    Relevant Orders   VITAMIN D  25 Hydroxy (Vit-D Deficiency, Fractures)   Other Visit Diagnoses       Need for vaccination       Relevant Orders   Flu vaccine trivalent PF, 6mos and older(Flulaval,Afluria,Fluarix,Fluzone) (Completed)       Assessment and Plan Assessment & Plan Obesity, class 3 Continued weight gain despite dietary restrictions and limited physical activity due to chronic pain. Recent 13-pound weight loss without changes in diet or activity level. Discussed potential use of Ozempic for weight management, considering no history of pancreatitis, multiple endocrine neoplasia, or medullary thyroid cancer. - Started Ozempic 0.25 mg weekly for 4 weeks, then will increase dose as tolerated.  Type 2 diabetes mellitus with unspecified and other specified complications Last A1c was 6.8. Discussed potential initiation of Ozempic for glycemic control, considering no contraindications such as pancreatitis or family history of medullary thyroid cancer or multiple endocrine neoplasia. - Ordered A1c test. - Started Ozempic 0.25 mg weekly for 4 weeks, then will increase dose as tolerated.  Hyperlipidemia Cholesterol levels not at goal despite maximum dose of rosuvastatin  40 mg. Discussed adding Zetia to improve lipid control. - Added Zetia to current regimen.  Chronic pain, hips and back Chronic pain in hips and back not relieved by steroid injections. Pain limits physical activity, contributing to weight management challenges. Referral to pain clinic for further management. - Ongoing management by pain    Meds ordered this encounter  Medications   Semaglutide,0.25 or 0.5MG /DOS, (OZEMPIC, 0.25 OR 0.5 MG/DOSE,) 2 MG/3ML SOPN    Sig: Inject 0.25 mg into the skin once a week.    Dispense:  3 mL    Refill:  0    Supervising Provider:   DUANNE LOWERS T [[id number]]   ezetimibe (ZETIA) 10 MG tablet    Sig: Take 1 tablet (10 mg total)  by mouth daily.    Dispense:  90 tablet    Refill:  3    Supervising Provider:   DUANNE LOWERS T [[id number]]    Return in about 4 weeks (around 11/24/2024) for diabetes.  Jeoffrey GORMAN Barrio, FNP [facility]

## 2024-10-27 NOTE — Telephone Encounter (Signed)
 Pharmacy Patient Advocate Encounter  Received notification from [facility] MEDICAID that Prior Authorization for Ozempic (0.25 or 0.5 MG/DOSE) 2MG /3ML pen-injectors has been CANCELLED due to  Step therapy required, patient must try and fail metformin first   PA #/Case ID/Reference #: [id number]

## 2024-10-27 NOTE — Telephone Encounter (Signed)
 Pharmacy Patient Advocate Encounter   Received notification from CoverMyMeds that prior authorization for Ozempic (0.25 or 0.5 MG/DOSE) 2MG /3ML pen-injectors is required/requested.   Insurance verification completed.   The patient is insured through [facility] MEDICAID.   Per test claim: PA required; PA submitted to above mentioned insurance via Latent Key/confirmation #/EOC [id number] Status is pending

## 2024-10-28 ENCOUNTER — Other Ambulatory Visit: Payer: Self-pay

## 2024-10-28 ENCOUNTER — Telehealth: Payer: Self-pay

## 2024-10-28 ENCOUNTER — Other Ambulatory Visit (HOSPITAL_COMMUNITY): Payer: Self-pay

## 2024-10-28 DIAGNOSIS — E118 Type 2 diabetes mellitus with unspecified complications: Secondary | ICD-10-CM

## 2024-10-28 LAB — HEMOGLOBIN A1C
Hgb A1c MFr Bld: 7 % — ABNORMAL HIGH (ref <5.7)
Mean Plasma Glucose: 154 mg/dL
eAG (mmol/L): 8.5 mmol/L

## 2024-10-28 LAB — VITAMIN D 25 HYDROXY (VIT D DEFICIENCY, FRACTURES): Vit D, 25-Hydroxy: 18 ng/mL — ABNORMAL LOW (ref 30–100)

## 2024-10-28 MED ORDER — METFORMIN HCL ER 500 MG PO TB24: 500.0000 mg | ORAL_TABLET | Freq: Every day | ORAL | 1 refills | Status: AC

## 2024-10-28 NOTE — Telephone Encounter (Signed)
 Message received from patient. Pt states insurance is requesting a Prior Auth for the Semaglutide 0.25. Thank you!

## 2024-10-28 NOTE — Telephone Encounter (Signed)
 Pt. Has already been spoken to . Insurance has denied ozempic until she has tried and failed metformin . Per provider. Ordered metformin 500 xr awaiting verbal cosign.

## 2024-10-29 ENCOUNTER — Ambulatory Visit: Attending: Pain Medicine | Admitting: Pain Medicine

## 2024-10-29 ENCOUNTER — Encounter: Payer: Self-pay | Admitting: Pain Medicine

## 2024-10-29 VITALS — BP 121/88 | HR 91 | Temp 97.2°F | Ht 63.0 in | Wt 241.0 lb

## 2024-10-29 DIAGNOSIS — M5090 Cervical disc disorder, unspecified, unspecified cervical region: Secondary | ICD-10-CM | POA: Insufficient documentation

## 2024-10-29 DIAGNOSIS — M5412 Radiculopathy, cervical region: Secondary | ICD-10-CM | POA: Diagnosis present

## 2024-10-29 DIAGNOSIS — M542 Cervicalgia: Secondary | ICD-10-CM | POA: Insufficient documentation

## 2024-10-29 DIAGNOSIS — M549 Dorsalgia, unspecified: Secondary | ICD-10-CM | POA: Insufficient documentation

## 2024-10-29 DIAGNOSIS — G8929 Other chronic pain: Secondary | ICD-10-CM | POA: Insufficient documentation

## 2024-10-29 DIAGNOSIS — Z09 Encounter for follow-up examination after completed treatment for conditions other than malignant neoplasm: Secondary | ICD-10-CM | POA: Diagnosis present

## 2024-10-29 NOTE — Patient Instructions (Signed)
 ______________________________________________________________________    Procedure instructions  Stop blood-thinners  Do not eat or drink fluids (other than water ) for 6 hours before your procedure  No water  for 2 hours before your procedure  Take your blood pressure medicine with a sip of water   Arrive 30 minutes before your appointment  If sedation is planned, bring suitable driver. [place], [place], & public transportation are NOT APPROVED)  Carefully read the Preparing for your procedure detailed instructions  If you have questions call us  at [phone]  Procedure appointments are for procedures only.   NO medication refills or new problem evaluations will be done on procedure days.   Only the scheduled, pre-approved procedure and side will be done.   ______________________________________________________________________     ______________________________________________________________________    Preparing for your procedure  Appointments: If you think you may not be able to keep your appointment, call 24-48 hours in advance to cancel. We need time to make it available to others.  Procedure visits are for procedures only. During your procedure appointment there will be: NO Prescription Refills*. NO medication changes or discussions*. NO discussion of disability issues*. NO unrelated pain problem evaluations*. NO evaluations to order other pain procedures*. *These will be addressed at a separate and distinct evaluation encounter on the provider's evaluation schedule and not during procedure days.  Instructions: Food intake: Avoid eating anything solid for at least 8 hours prior to your procedure. Clear liquid intake: You may take clear liquids such as water  up to 2 hours prior to your procedure. (No carbonated drinks. No soda.) Transportation: Unless otherwise stated by your physician, bring a driver. (Driver cannot be a Market researcher, Pharmacist, community, or any other form of public  transportation.) Morning Medicines: Except for blood thinners, take all of your other morning medications with a sip of water . Make sure to take your heart and blood pressure medicines. If your blood pressure's lower number is above 100, the case will be rescheduled. Blood thinners: Make sure to stop your blood thinners as instructed.  If you take a blood thinner, but were not instructed to stop it, call our office [phone] and ask to talk to a nurse. Not stopping a blood thinner prior to certain procedures could lead to serious complications. Diabetics on insulin : Notify the staff so that you can be scheduled 1st case in the morning. If your diabetes requires high dose insulin , take only  of your normal insulin  dose the morning of the procedure and notify the staff that you have done so. Preventing infections: Shower with an antibacterial soap the morning of your procedure.  Build-up your immune system: Take 1000 mg of Vitamin C with every meal (3 times a day) the day prior to your procedure. Antibiotics: Inform the nursing staff if you are taking any antibiotics or if you have any conditions that may require antibiotics prior to procedures. (Example: recent joint implants)   Pregnancy: If you are pregnant make sure to notify the nursing staff. Not doing so may result in injury to the fetus, including death.  Sickness: If you have a cold, fever, or any active infections, call and cancel or reschedule your procedure. Receiving steroids while having an infection may result in complications. Arrival: You must be in the facility at least 30 minutes prior to your scheduled procedure. Tardiness: Your scheduled time is also the cutoff time. If you do not arrive at least 15 minutes prior to your procedure, you will be rescheduled.  Children: Do not bring any children with  you. Make arrangements to keep them home. Dress appropriately: There is always a possibility that your clothing may get soiled. Avoid  long dresses. Valuables: Do not bring any jewelry or valuables.  Reasons to call and reschedule or cancel your procedure: (Following these recommendations will minimize the risk of a serious complication.) Surgeries: Avoid having procedures within 2 weeks of any surgery. (Avoid for 2 weeks before or after any surgery). Flu Shots: Avoid having procedures within 2 weeks of a flu shots or . (Avoid for 2 weeks before or after immunizations). Barium: Avoid having a procedure within 7-10 days after having had a radiological study involving the use of radiological contrast. (Myelograms, Barium swallow or enema study). Heart attacks: Avoid any elective procedures or surgeries for the initial 6 months after a Myocardial Infarction (Heart Attack). Blood thinners: It is imperative that you stop these medications before procedures. Let us  know if you if you take any blood thinner.  Infection: Avoid procedures during or within two weeks of an infection (including chest colds or gastrointestinal problems). Symptoms associated with infections include: Localized redness, fever, chills, night sweats or profuse sweating, burning sensation when voiding, cough, congestion, stuffiness, runny nose, sore throat, diarrhea, nausea, vomiting, cold or Flu symptoms, recent or current infections. It is specially important if the infection is over the area that we intend to treat. Heart and lung problems: Symptoms that may suggest an active cardiopulmonary problem include: cough, chest pain, breathing difficulties or shortness of breath, dizziness, ankle swelling, uncontrolled high or unusually low blood pressure, and/or palpitations. If you are experiencing any of these symptoms, cancel your procedure and contact your primary care physician for an evaluation.  Remember:  Regular Business hours are:  Monday to Thursday 8:00 AM to 4:00 PM  Provider's Schedule: Eric Como, MD:  Procedure days: Tuesday and Thursday 7:30  AM to 4:00 PM  Wallie Sherry, MD:  Procedure days: Monday and Wednesday 7:30 AM to 4:00 PM Last  Updated: 11/27/2023 ______________________________________________________________________     ______________________________________________________________________    General Risks and Possible Complications  Patient Responsibilities: It is important that you read this as it is part of your informed consent. It is our duty to inform you of the risks and possible complications associated with treatments offered to you. It is your responsibility as a patient to read this and to ask questions about anything that is not clear or that you believe was not covered in this document.  Patient's Rights: You have the right to refuse treatment. You also have the right to change your mind, even after initially having agreed to have the treatment done. However, under this last option, if you wait until the last second to change your mind, you may be charged for the materials used up to that point.  Introduction: Medicine is not an Visual merchandiser. Everything in Medicine, including the lack of treatment(s), carries the potential for danger, harm, or loss (which is by definition: Risk). In Medicine, a complication is a secondary problem, condition, or disease that can aggravate an already existing one. All treatments carry the risk of possible complications. The fact that a side effects or complications occurs, does not imply that the treatment was conducted incorrectly. It must be clearly understood that these can happen even when everything is done following the highest safety standards.  No treatment: You can choose not to proceed with the proposed treatment alternative. The "PRO(s)" would include: avoiding the risk of complications associated with the therapy. The "CON(s)" would include:  not getting any of the treatment benefits. These benefits fall under one of three categories: diagnostic; therapeutic; and/or  palliative. Diagnostic benefits include: getting information which can ultimately lead to improvement of the disease or symptom(s). Therapeutic benefits are those associated with the successful treatment of the disease. Finally, palliative benefits are those related to the decrease of the primary symptoms, without necessarily curing the condition (example: decreasing the pain from a flare-up of a chronic condition, such as incurable terminal cancer).  General Risks and Complications: These are associated to most interventional treatments. They can occur alone, or in combination. They fall under one of the following six (6) categories: no benefit or worsening of symptoms; bleeding; infection; nerve damage; allergic reactions; and/or death. No benefits or worsening of symptoms: In Medicine there are no guarantees, only probabilities. No healthcare provider can ever guarantee that a medical treatment will work, they can only state the probability that it may. Furthermore, there is always the possibility that the condition may worsen, either directly, or indirectly, as a consequence of the treatment. Bleeding: This is more common if the patient is taking a blood thinner, either prescription or over the counter (example: Goody Powders, Fish oil, Aspirin, Garlic, etc.), or if suffering a condition associated with impaired coagulation (example: Hemophilia, cirrhosis of the liver, low platelet counts, etc.). However, even if you do not have one on these, it can still happen. If you have any of these conditions, or take one of these drugs, make sure to notify your treating physician. Infection: This is more common in patients with a compromised immune system, either due to disease (example: diabetes, cancer, human immunodeficiency virus [HIV], etc.), or due to medications or treatments (example: therapies used to treat cancer and rheumatological diseases). However, even if you do not have one on these, it can still  happen. If you have any of these conditions, or take one of these drugs, make sure to notify your treating physician. Nerve Damage: This is more common when the treatment is an invasive one, but it can also happen with the use of medications, such as those used in the treatment of cancer. The damage can occur to small secondary nerves, or to large primary ones, such as those in the spinal cord and brain. This damage may be temporary or permanent and it may lead to impairments that can range from temporary numbness to permanent paralysis and/or brain death. Allergic Reactions: Any time a substance or material comes in contact with our body, there is the possibility of an allergic reaction. These can range from a mild skin rash (contact dermatitis) to a severe systemic reaction (anaphylactic reaction), which can result in death. Death: In general, any medical intervention can result in death, most of the time due to an unforeseen complication. ______________________________________________________________________      ______________________________________________________________________    Steroid injections  Common steroids for injections Triamcinolone: Used by many sports medicine physicians for large joint and bursal injections, often combined with a local anesthetic like lidocaine . A study focusing on coccydynia (tailbone pain) found triamcinolone was more effective than betamethasone , suggesting it may also be preferable for other localized inflammation conditions. Methylprednisolone: A common alternative to triamcinolone that is also a strong anti-inflammatory. It is available in different formulations, with the acetate suspension being the long-acting option for intra-articular injections. Dexamethasone : This is a non-particulate steroid, meaning it has a lower risk of tissue damage compared to particulate steroids like triamcinolone and methylprednisolone. While less common for this specific  use,  it is an option for targeted injections.   Considerations for physicians Particulate vs. non-particulate steroids: Triamcinolone and methylprednisolone are particulate, meaning they can clump together. Dexamethasone  is non-particulate. Particulate steroids are often preferred for their longer-lasting effects but carry a theoretical higher risk for certain injections (though this is less of a concern in the costochondral joints). Combined injectate: Corticosteroids are typically mixed with a local anesthetic like lidocaine  to provide both immediate pain relief (from the anesthetic) and longer-term inflammation reduction (from the steroid). Imaging guidance: To ensure accurate placement of the needle and medication, physicians may use ultrasound or fluoroscopic guidance for the injection, especially in complex or refractory cases.   Patient guidance Before undergoing a steroid injection, discuss the options with your physician. They will determine the best steroid, dosage, and procedure for your specific case based on factors like: Severity of your condition History of response to other treatments Your overall health status Experience and preference of the physician  Last  Updated: 08/12/2024 ______________________________________________________________________

## 2024-10-29 NOTE — Progress Notes (Signed)
 Safety precautions to be maintained throughout the outpatient stay will include: orient to surroundings, keep bed in low position, maintain call bell within reach at all times, provide assistance with transfer out of bed and ambulation.

## 2024-10-31 ENCOUNTER — Ambulatory Visit: Payer: Self-pay | Admitting: Family Medicine

## 2024-11-03 ENCOUNTER — Telehealth: Payer: Self-pay | Admitting: Pharmacy Technician

## 2024-11-03 ENCOUNTER — Other Ambulatory Visit (HOSPITAL_COMMUNITY): Payer: Self-pay

## 2024-11-03 ENCOUNTER — Telehealth: Payer: Self-pay

## 2024-11-03 NOTE — Telephone Encounter (Signed)
 PA request has been Received. New Encounter has been or will be created for follow up. For additional info see Pharmacy Prior Auth telephone encounter from 11/03/24.

## 2024-11-03 NOTE — Telephone Encounter (Signed)
 Jeoffrey Barrio, FNP is asking if a PA can be re-run for this patient as she is not able to tolerate Metformin. Documentation in My Chart message. I have copied and pasted the information below also. Thank you!  Barrio Jeoffrey RAMAN, FNP to Me (Selected Message)     11/03/24  7:33 AM If she is unable to tolerate due to nausea can we resend for PA for Ozempic due to this?     10/31/24  4:54 PM You routed this conversation to Barrio Jeoffrey RAMAN, FNP Jerelene MARLA Sewer to [facility] (supporting Jeoffrey RAMAN Barrio, FNP)     10/31/24  3:50 PM Tested my blood this morning when I woke up it was 168 VITAMIN D  25 Hydroxy (Vit-D Deficiency, Fractures); Hemoglobin A1c Lurie K Betterton to [facility] (supporting Jeoffrey RAMAN Barrio, FNP)     10/31/24  3:49 PM No ma'am it needs pre-approval but the nurse called and said you was sending metformin and I picked that up. It's keeping very nauseous. Like I don't want to move. And yes taking the vit d

## 2024-11-03 NOTE — Telephone Encounter (Signed)
 Pharmacy Patient Advocate Encounter  Received notification from [facility] MEDICAID that Prior Authorization for Ozempic (0.25 or 0.5 MG/DOSE) 2MG /3ML pen-injectors has been APPROVED from 11/03/24 to 11/03/25. Ran test claim, Copay is $4.00. This test claim was processed through [facility]- copay amounts may vary at other pharmacies due to pharmacy/plan contracts, or as the patient moves through the different stages of their insurance plan.   PA #/Case ID/Reference #: [id number]

## 2024-11-03 NOTE — Telephone Encounter (Signed)
 Pharmacy Patient Advocate Encounter   Received notification from Pt Calls Messages that prior authorization for Ozempic (0.25mg /dose) 2mg /67ml pen is required/requested.   Insurance verification completed.   The patient is insured through [facility] MEDICAID.   Per test claim: PA required; PA started via CoverMyMeds. KEY [id number] . Waiting for clinical questions to populate.

## 2024-11-04 ENCOUNTER — Other Ambulatory Visit: Payer: Self-pay

## 2024-11-05 ENCOUNTER — Ambulatory Visit: Admitting: Podiatry

## 2024-11-06 ENCOUNTER — Ambulatory Visit (HOSPITAL_BASED_OUTPATIENT_CLINIC_OR_DEPARTMENT_OTHER): Admitting: Pain Medicine

## 2024-11-06 ENCOUNTER — Ambulatory Visit
Admission: RE | Admit: 2024-11-06 | Discharge: 2024-11-06 | Disposition: A | Discharge status: Home / Self Care | Source: Ambulatory Visit | Attending: Pain Medicine | Admitting: Pain Medicine

## 2024-11-06 ENCOUNTER — Encounter: Payer: Self-pay | Admitting: Pain Medicine

## 2024-11-06 VITALS — BP 95/52 | HR 80 | Temp 98.1°F | Resp 16 | Ht 63.0 in | Wt 241.0 lb

## 2024-11-06 DIAGNOSIS — M5090 Cervical disc disorder, unspecified, unspecified cervical region: Secondary | ICD-10-CM | POA: Insufficient documentation

## 2024-11-06 DIAGNOSIS — M5030 Other cervical disc degeneration, unspecified cervical region: Secondary | ICD-10-CM | POA: Diagnosis present

## 2024-11-06 DIAGNOSIS — M5412 Radiculopathy, cervical region: Secondary | ICD-10-CM

## 2024-11-06 DIAGNOSIS — G8929 Other chronic pain: Secondary | ICD-10-CM

## 2024-11-06 DIAGNOSIS — M25519 Pain in unspecified shoulder: Secondary | ICD-10-CM

## 2024-11-06 DIAGNOSIS — M542 Cervicalgia: Secondary | ICD-10-CM | POA: Diagnosis present

## 2024-11-06 DIAGNOSIS — M549 Dorsalgia, unspecified: Secondary | ICD-10-CM | POA: Insufficient documentation

## 2024-11-06 MED ORDER — MIDAZOLAM HCL 5 MG/5ML IJ SOLN
0.5000 mg | Freq: Once | INTRAMUSCULAR | Status: AC
  Administered 2024-11-06: 2 mg via INTRAVENOUS
  Administered 2024-11-06: 1 mg via INTRAVENOUS

## 2024-11-06 MED ORDER — IOHEXOL 180 MG/ML  SOLN
INTRAMUSCULAR | Status: AC
  Filled 2024-11-06: qty 10

## 2024-11-06 MED ORDER — LIDOCAINE HCL 2 % IJ SOLN
INTRAMUSCULAR | Status: AC
  Filled 2024-11-06: qty 20

## 2024-11-06 MED ORDER — SODIUM CHLORIDE (PF) 0.9 % IJ SOLN
INTRAMUSCULAR | Status: AC
  Filled 2024-11-06: qty 10

## 2024-11-06 MED ORDER — IOHEXOL 180 MG/ML  SOLN
10.0000 mL | Freq: Once | INTRAMUSCULAR | Status: AC
  Administered 2024-11-06: 10 mL via EPIDURAL

## 2024-11-06 MED ORDER — SODIUM CHLORIDE 0.9% FLUSH
1.0000 mL | Freq: Once | INTRAVENOUS | Status: AC
  Administered 2024-11-06: 1 mL

## 2024-11-06 MED ORDER — FENTANYL CITRATE (PF) 100 MCG/2ML IJ SOLN
25.0000 ug | INTRAMUSCULAR | Status: DC | PRN
  Administered 2024-11-06: 50 ug via INTRAVENOUS

## 2024-11-06 MED ORDER — LIDOCAINE HCL 2 % IJ SOLN
20.0000 mL | Freq: Once | INTRAMUSCULAR | Status: AC
  Administered 2024-11-06: 400 mg

## 2024-11-06 MED ORDER — ROPIVACAINE HCL 2 MG/ML IJ SOLN
1.0000 mL | Freq: Once | INTRAMUSCULAR | Status: AC
  Administered 2024-11-06: 1 mL via EPIDURAL

## 2024-11-06 MED ORDER — DEXAMETHASONE SOD PHOSPHATE PF 10 MG/ML IJ SOLN
10.0000 mg | Freq: Once | INTRAMUSCULAR | Status: AC
  Administered 2024-11-06: 10 mg

## 2024-11-06 MED ORDER — MIDAZOLAM HCL 5 MG/5ML IJ SOLN
INTRAMUSCULAR | Status: AC
  Filled 2024-11-06: qty 5

## 2024-11-06 MED ORDER — FENTANYL CITRATE (PF) 100 MCG/2ML IJ SOLN
INTRAMUSCULAR | Status: AC
  Filled 2024-11-06: qty 2

## 2024-11-06 MED ORDER — ROPIVACAINE HCL 2 MG/ML IJ SOLN
INTRAMUSCULAR | Status: AC
  Filled 2024-11-06: qty 20

## 2024-11-06 MED ORDER — PENTAFLUOROPROP-TETRAFLUOROETH EX AERO
INHALATION_SPRAY | Freq: Once | CUTANEOUS | Status: AC
  Administered 2024-11-06: 30 via TOPICAL

## 2024-11-06 NOTE — Patient Instructions (Addendum)
 ______________________________________________________________________    Post-Procedure Discharge Instructions  INSTRUCTIONS Apply ice:  Purpose: This will minimize any swelling and discomfort after procedure.  When: Day of procedure, as soon as you get home. How: Fill a plastic sandwich bag with crushed ice. Cover it with a small towel and apply to injection site. How long: (15 min on, 15 min off) Apply for 15 minutes then remove x 15 minutes.  Repeat sequence on day of procedure, until you go to bed. Apply heat:  Purpose: To treat any soreness and discomfort from the procedure. When: Starting the next day after the procedure. How: Apply heat to procedure site starting the day following the procedure. How long: May continue to repeat daily, until discomfort goes away. Food intake: Start with clear liquids (like water ) and advance to regular food, as tolerated.  Physical activities: Keep activities to a minimum for the first 8 hours after the procedure. After that, then as tolerated. Driving: If you have received any sedation, be responsible and do not drive. You are not allowed to drive for 24 hours after having sedation. Blood thinner: (Applies only to those taking blood thinners) You may restart your blood thinner 6 hours after your procedure. Insulin : (Applies only to Diabetic patients taking insulin ) As soon as you can eat, you may resume your normal dosing schedule. Infection prevention: Keep procedure site clean and dry. Shower daily and clean area with soap and water .  PAIN DIARY Post-procedure Pain Diary: Extremely important that this be done correctly and accurately. Recorded information will be used to determine the next step in treatment. For the purpose of accuracy, follow these rules: Evaluate only the area treated. Do not report or include pain from an untreated area. For the purpose of this evaluation, ignore all other areas of pain, except for the treated area. After your  procedure, avoid taking a long nap and attempting to complete the pain diary after you wake up. Instead, set your alarm clock to go off every hour, on the hour, for the initial 8 hours after the procedure. Document the duration of the numbing medicine, and the relief you are getting from it. Do not go to sleep and attempt to complete it later. It will not be accurate. If you received sedation, it is likely that you were given a medication that may cause amnesia. Because of this, completing the diary at a later time may cause the information to be inaccurate. This information is needed to plan your care. Follow-up appointment: Keep your post-procedure follow-up evaluation appointment after the procedure (usually 2 weeks for most procedures, 6 weeks for radiofrequencies). DO NOT FORGET to bring you pain diary with you.   EXPECT... (What should I expect to see with my procedure?) From numbing medicine (AKA: Local Anesthetics): Numbness or decrease in pain. You may also experience some weakness, which if present, could last for the duration of the local anesthetic. Onset: Full effect within 15 minutes of injected. Duration: It will depend on the type of local anesthetic used. On the average, 1 to 8 hours.  From steroids (Applies only if steroids were used): Decrease in swelling or inflammation. Once inflammation is improved, relief of the pain will follow. Onset of benefits: Depends on the amount of swelling present. The more swelling, the longer it will take for the benefits to be seen. In some cases, up to 10 days. Duration: Steroids will stay in the system x 2 weeks. Duration of benefits will depend on multiple posibilities including persistent irritating  factors. Side-effects: If present, they may typically last 2 weeks (the duration of the steroids). Frequent: Cramps (if they occur, drink Gatorade and take over-the-counter Magnesium  450-500 mg once to twice a day); water  retention with temporary weight  gain; increases in blood sugar; decreased immune system response; increased appetite. Occasional: Facial flushing (red, warm cheeks); mood swings; menstrual changes. Uncommon: Long-term decrease or suppression of natural hormones; bone thinning. (These are more common with higher doses or more frequent use. This is why we prefer that our patients avoid having any injection therapies in other practices.)  Very Rare: Severe mood changes; psychosis; aseptic necrosis. From procedure: Some discomfort is to be expected once the numbing medicine wears off. This should be minimal if ice and heat are applied as instructed.  CALL IF... (When should I call?) You experience numbness and weakness that gets worse with time, as opposed to wearing off. New onset bowel or bladder incontinence. (Applies only to procedures done in the spine)  Emergency Numbers: Durning business hours (Monday - Thursday, 8:00 AM - 4:00 PM) (Friday, 9:00 AM - 12:00 Noon): [phone] After hours: [phone] NOTE: If you are having a problem and are unable connect with, or to talk to a provider, then go to your nearest urgent care or emergency department. If the problem is serious and urgent, please call 911. ______________________________________________________________________     ______________________________________________________________________    Steroid injections  Common steroids for injections Triamcinolone : Used by many sports medicine physicians for large joint and bursal injections, often combined with a local anesthetic like lidocaine . A study focusing on coccydynia (tailbone pain) found triamcinolone  was more effective than betamethasone, suggesting it may also be preferable for other localized inflammation conditions. Methylprednisolone : A common alternative to triamcinolone  that is also a strong anti-inflammatory. It is available in different formulations, with the acetate suspension being the long-acting  option for intra-articular injections. Dexamethasone : This is a non-particulate steroid, meaning it has a lower risk of tissue damage compared to particulate steroids like triamcinolone  and methylprednisolone . While less common for this specific use, it is an option for targeted injections.   Considerations for physicians Particulate vs. non-particulate steroids: Triamcinolone  and methylprednisolone  are particulate, meaning they can clump together. Dexamethasone  is non-particulate. Particulate steroids are often preferred for their longer-lasting effects but carry a theoretical higher risk for certain injections (though this is less of a concern in the costochondral joints). Combined injectate: Corticosteroids are typically mixed with a local anesthetic like lidocaine  to provide both immediate pain relief (from the anesthetic) and longer-term inflammation reduction (from the steroid). Imaging guidance: To ensure accurate placement of the needle and medication, physicians may use ultrasound or fluoroscopic guidance for the injection, especially in complex or refractory cases.   Patient guidance Before undergoing a steroid injection, discuss the options with your physician. They will determine the best steroid, dosage, and procedure for your specific case based on factors like: Severity of your condition History of response to other treatments Your overall health status Experience and preference of the physician  Last  Updated: 08/12/2024 ______________________________________________________________________    Moderate Conscious Sedation, Adult, Care After After the procedure, it is common to have: Sleepiness for a few hours. Impaired judgment for a few hours. Trouble with balance. Nausea or vomiting if you eat too soon. Follow these instructions at home: For the time period you were told by your health care provider:  Rest. Do not participate in activities where you could fall or become  injured. Do not drive or  use machinery. Do not drink alcohol. Do not take sleeping pills or medicines that cause drowsiness. Do not make important decisions or sign legal documents. Do not take care of children on your own. Eating and drinking Follow instructions from your health care provider about what you may eat and drink. Drink enough fluid to keep your urine pale yellow. If you vomit: Drink clear fluids slowly and in small amounts as you are able. Clear fluids include water , ice chips, low-calorie sports drinks, and fruit juice that has water  added to it (diluted fruit juice). Eat light and bland foods in small amounts as you are able. These foods include bananas, applesauce, rice, lean meats, toast, and crackers. General instructions Take over-the-counter and prescription medicines only as told by your health care provider. Have a responsible adult stay with you for the time you are told. Do not use any products that contain nicotine or tobacco. These products include cigarettes, chewing tobacco, and vaping devices, such as e-cigarettes. If you need help quitting, ask your health care provider. Return to your normal activities as told by your health care provider. Ask your health care provider what activities are safe for you. Your health care provider may give you more instructions. Make sure you know what you can and cannot do. Contact a health care provider if: You are still sleepy or having trouble with balance after 24 hours. You feel light-headed. You vomit every time you eat or drink. You get a rash. You have a fever. You have redness or swelling around the IV site. Get help right away if: You have trouble breathing. You start to feel confused at home. These symptoms may be an emergency. Get help right away. Call 911. Do not wait to see if the symptoms will go away. Do not drive yourself to the hospital. This information is not intended to replace advice given to you by  your health care provider. Make sure you discuss any questions you have with your health care provider. Document Revised: 06/19/2022 Document Reviewed: 06/19/2022 [employer] Patient Education  2024 [employer].

## 2024-11-06 NOTE — Progress Notes (Signed)
 PROVIDER NOTE: Interpretation of information contained herein should be left to medically-trained personnel. Specific patient instructions are provided elsewhere under Patient Instructions section of medical record. This document was created in part using STT-dictation technology, any transcriptional errors that may result from this process are unintentional.  Patient: Lindsay Harris Type: Established DOB: 26-Mar-1976 MRN: [id number] PCP: Kayla Jeoffrey RAMAN, FNP  Service: Procedure DOS: 11/06/2024 Setting: Ambulatory Location: Ambulatory outpatient facility Delivery: Face-to-face Provider: Eric DELENA Como, MD Specialty: Interventional Pain Management Specialty designation: 09 Location: Outpatient facility Ref. Prov.: Kayla Jeoffrey RAMAN, FNP       Interventional Therapy   Type: Cervical Epidural Steroid injection (CESI) (Interlaminar) #3  Laterality: Right (-RT)  Level: C7-T1 DOS: 11/06/2024  Provider: Eric DELENA Como, MD Imaging: Fluoroscopy-guided Spinal ([id number]) Anesthesia: Local anesthesia (1-2% Lidocaine ) Anxiolysis: IV Versed   3.0 mg           Sedation: Minimal Sedation Fentanyl 1 mL (50 mcg)  Medical Necessity Purpose: Diagnostic/Therapeutic Rationale (medical necessity): procedure needed and proper for the diagnosis and/or treatment of Lindsay Harris's medical symptoms and needs. Indications: Cervicalgia, cervical radicular pain, degenerative disc disease, severe enough to impact quality of life or function. 1. Cervical radiculitis (Right)   2. Cervical neck pain with evidence of disc disease   3. Cervicalgia   4. Chronic neck and back pain   5. Chronic neck pain (Bilateral)   6. DDD (degenerative disc disease), cervical   7. Neck and shoulder pain   8. Neck pain of over 3 months duration   9. Posterior neck pain    NAS-11 Pain score:   Pre-procedure: 8 /10   Post-procedure: 0-No pain/10     Position  Prep  Materials:  Location setting: Procedure  suite Position: Prone, on modified reverse trendelenburg to facilitate breathing, with head in head-cradle. Pillows positioned under chest (below chin-level) with cervical spine flexed. Safety Precautions: Patient was assessed for positional comfort and pressure points before starting the procedure. Prepping solution: DuraPrep (Iodine Povacrylex [0.7% available iodine] and Isopropyl Alcohol, 74% w/w) Prep Area: Entire  cervicothoracic region Approach: percutaneous, paramedial Intended target: Posterior cervical epidural space Materials Procedure:  Tray: Epidural Needle(s): Epidural (Tuohy) Qty: 1 Length: (90mm) 3.5-inch Gauge: 17G  H&P (Pre-op Assessment):  Lindsay Harris is a 48 y.o. (year old), female patient, seen today for interventional treatment. She  has a past surgical history that includes Colonoscopy with propofol  (N/A, 08/09/2023) and polypectomy (N/A, 08/09/2023). Lindsay Harris has a current medication list which includes the following prescription(s): albuterol , albuterol , vitamin d3, b-12, cyclobenzaprine, cyclobenzaprine, duloxetine , dupixent , orilissa , epinephrine, eszopiclone, ezetimibe, furosemide, gabapentin , gabapentin , lactulose , linzess , metformin, montelukast , ondansetron , pantoprazole , propranolol , propranolol  er, rosuvastatin , ozempic (0.25 or 0.5 mg/dose), simethicone, aerochamber mv, spiriva  handihaler, sumatriptan, symbicort , gemtesa , gemtesa , and vitamin d  (ergocalciferol ), and the following Facility-Administered Medications: fentanyl. Her primarily concern today is the Neck Pain  Initial Vital Signs:  Pulse/HCG Rate: 80ECG Heart Rate: 71 Temp: (!) 97 F (36.1 C) Resp: 18 BP: 117/73 SpO2: 96 %  BMI: Estimated body mass index is 42.69 kg/m as calculated from the following:   Height as of this encounter: 5' 3 (1.6 m).   Weight as of this encounter: 241 lb (109.3 kg).  Risk Assessment: Allergies: Reviewed. She is allergic to amoxicillin.  Allergy Precautions: None  required Coagulopathies: Reviewed. None identified.  Blood-thinner therapy: None at this time Active Infection(s): Reviewed. None identified. Lindsay Harris is afebrile  Site Confirmation: Lindsay Harris was asked to confirm the procedure and laterality before marking the  site Procedure checklist: Completed Consent: Before the procedure and under the influence of no sedative(s), amnesic(s), or anxiolytics, the patient was informed of the treatment options, risks and possible complications. To fulfill our ethical and legal obligations, as recommended by the American Medical Association's Code of Ethics, I have informed the patient of my clinical impression; the nature and purpose of the treatment or procedure; the risks, benefits, and possible complications of the intervention; the alternatives, including doing nothing; the risk(s) and benefit(s) of the alternative treatment(s) or procedure(s); and the risk(s) and benefit(s) of doing nothing. The patient was provided information about the general risks and possible complications associated with the procedure. These may include, but are not limited to: failure to achieve desired goals, infection, bleeding, organ or nerve damage, allergic reactions, paralysis, and death. In addition, the patient was informed of those risks and complications associated to Spine-related procedures, such as failure to decrease pain; infection (i.e.: Meningitis, epidural or intraspinal abscess); bleeding (i.e.: epidural hematoma, subarachnoid hemorrhage, or any other type of intraspinal or peri-dural bleeding); organ or nerve damage (i.e.: Any type of peripheral nerve, nerve root, or spinal cord injury) with subsequent damage to sensory, motor, and/or autonomic systems, resulting in permanent pain, numbness, and/or weakness of one or several areas of the body; allergic reactions; (i.e.: anaphylactic reaction); and/or death. Furthermore, the patient was informed of those risks and  complications associated with the medications. These include, but are not limited to: allergic reactions (i.e.: anaphylactic or anaphylactoid reaction(s)); adrenal axis suppression; blood sugar elevation that in diabetics may result in ketoacidosis or comma; water  retention that in patients with history of congestive heart failure may result in shortness of breath, pulmonary edema, and decompensation with resultant heart failure; weight gain; swelling or edema; medication-induced neural toxicity; particulate matter embolism and blood vessel occlusion with resultant organ, and/or nervous system infarction; and/or aseptic necrosis of one or more joints. Finally, the patient was informed that Medicine is not an exact science; therefore, there is also the possibility of unforeseen or unpredictable risks and/or possible complications that may result in a catastrophic outcome. The patient indicated having understood very clearly. We have given the patient no guarantees and we have made no promises. Enough time was given to the patient to ask questions, all of which were answered to the patient's satisfaction. Lindsay Harris has indicated that she wanted to continue with the procedure. Attestation: I, the ordering provider, attest that I have discussed with the patient the benefits, risks, side-effects, alternatives, likelihood of achieving goals, and potential problems during recovery for the procedure that I have provided informed consent. Date  Time: 11/06/2024  8:06 AM  Pre-Procedure Preparation:  Monitoring: As per clinic protocol. Respiration, ETCO2, SpO2, BP, heart rate and rhythm monitor placed and checked for adequate function Safety Precautions: Patient was assessed for positional comfort and pressure points before starting the procedure. Time-out: I initiated and conducted the Time-out before starting the procedure, as per protocol. The patient was asked to participate by confirming the accuracy of the  Time Out information. Verification of the correct person, site, and procedure were performed and confirmed by me, the nursing staff, and the patient. Time-out conducted as per Joint Commission's Universal Protocol (UP.01.01.01). Time: 0840 Start Time: 0840 hrs.  Description  Narrative of Procedure:          Rationale (medical necessity): procedure needed and proper for the diagnosis and/or treatment of the patient's medical symptoms and needs. Start Time: 0840 hrs. Safety Precautions: Aspiration looking for  blood return was conducted prior to all injections. At no point did we inject any substances, as a needle was being advanced. No attempts were made at seeking any paresthesias. Safe injection practices and needle disposal techniques used. Medications properly checked for expiration dates. SDV (single dose vial) medications used. Description of procedure: Protocol guidelines were followed. The patient was assisted into a comfortable position. The target area was identified and the area prepped in the usual manner. Skin & deeper tissues infiltrated with local anesthetic. Appropriate amount of time allowed to pass for local anesthetics to take effect. Using fluoroscopic guidance, the epidural needle was introduced through the skin, ipsilateral to the reported pain, and advanced to the target area. Posterior laminar os was contacted and the needle walked caudad, until the lamina was cleared. The ligamentum flavum was engaged and the epidural space identified using "loss-of-resistance technique" with 2-3 ml of PF-NaCl (0.9% NSS), in a 5cc dedicated LOR syringe. (See Imaging guidance below for use of contrast details.) Once proper needle placement was secured, and negative aspiration confirmed, the solution was injected in intermittent fashion, asking for systemic symptoms every 0.5cc. The needles were then removed and the area cleansed, making sure to leave some of the prepping solution back to take  advantage of its long term bactericidal properties.  Vitals:   11/06/24 0848 11/06/24 0858 11/06/24 0908 11/06/24 0918  BP: 106/62 (!) 104/52 (!) 101/53 (!) 95/52  Pulse:      Resp: 16 18 20 16   Temp:    98.1 F (36.7 C)  TempSrc:    Temporal  SpO2: 99% 97% 99% 97%  Weight:      Height:         End Time: 0845 hrs.  Imaging Guidance (Spinal):          Type of Imaging Technique: Fluoroscopy Guidance (Spinal) Indication(s): Fluoroscopy guidance for needle placement to enhance accuracy in procedures requiring precise needle localization for targeted delivery of medication in or near specific anatomical locations not easily accessible without such real-time imaging assistance. Exposure Time: Please see nurses notes. Contrast: Before injecting any contrast, we confirmed that the patient did not have an allergy to iodine, shellfish, or radiological contrast. Once satisfactory needle placement was completed at the desired level, radiological contrast was injected. Contrast injected under live fluoroscopy. No contrast complications. See chart for type and volume of contrast used. Fluoroscopic Guidance: I was personally present during the use of fluoroscopy. Tunnel Vision Technique used to obtain the best possible view of the target area. Parallax error corrected before commencing the procedure. Direction-depth-direction technique used to introduce the needle under continuous pulsed fluoroscopy. Once target was reached, antero-posterior, oblique, and lateral fluoroscopic projection used confirm needle placement in all planes. Images permanently stored in EMR. Interpretation: I personally interpreted the imaging intraoperatively. Adequate needle placement confirmed in multiple planes. Appropriate spread of contrast into desired area was observed. No evidence of afferent or efferent intravascular uptake. No intrathecal or subarachnoid spread observed. Permanent images saved into the patient's  record.  Post-operative Assessment:  Post-procedure Vital Signs:  Pulse/HCG Rate: 8077 Temp: 98.1 F (36.7 C) Resp: 16 BP: (!) 95/52 SpO2: 97 %  EBL: None  Complications: No immediate post-treatment complications observed by team, or reported by patient.  Note: The patient tolerated the entire procedure well. A repeat set of vitals were taken after the procedure and the patient was kept under observation following institutional policy, for this type of procedure. Post-procedural neurological assessment was performed, showing return  to baseline, prior to discharge. The patient was provided with post-procedure discharge instructions, including a section on how to identify potential problems. Should any problems arise concerning this procedure, the patient was given instructions to immediately contact us , at any time, without hesitation. In any case, we plan to contact the patient by telephone for a follow-up status report regarding this interventional procedure.  Comments:  No additional relevant information.  Plan of Care (POC)  Orders:  Orders Placed This Encounter  Procedures   Cervical Epidural Injection    Indication(s): Radiculitis and cervicalgia associated with cervical degenerative disc disease. Position: Prone Imaging guidance: Fluoroscopy required. Contrast required unless contraindicated by allergy or severe CKD. Equipment & Materials: Epidural tray & needle.    Scheduling Instructions:     Procedure: Cervical Epidural Steroid Injection/Block     Planned Level(s): C7-T1     Laterality: Right-sided     Anxiolysis: Patient's choice.     Date: 11/06/2024    Where will this procedure be performed?:   [facility]             by Dr. T[facility] 1-60 MIN NO REPORT    Intraoperative interpretation by procedural physician at [facility].    Standing Status:   Standing    Number of Occurrences:   1    Reason for exam::   Assistance in  needle guidance and placement for procedures requiring needle placement in or near specific anatomical locations not easily accessible without such assistance.   Informed Consent Details: Physician/Practitioner Attestation; Transcribe to consent form and obtain patient signature    Nursing instructions: Transcribe to consent form and obtain patient signature. Always confirm laterality of pain with Lindsay Harris, before procedure.    Physician/Practitioner attestation of informed consent for procedure/surgical case:   I, the physician/practitioner, attest that I have discussed with the patient the benefits, risks, side effects, alternatives, likelihood of achieving goals and potential problems during recovery for the procedure that I have provided informed consent.    Procedure:   Cervical Epidural Steroid Injection (CESI) under fluoroscopic guidance    Physician/Practitioner performing the procedure:   Jayant Kriz A. Tanya MD    Indication/Reason:   Indications: Cervicalgia (neck pain), cervical radicular pain, radiculitis (arm/shoulder pain, numbness, and/or weakness), degenerative disc disease, severe enough to greatly impact quality of life or function.   Provide equipment / supplies at bedside    Procedural tray: Epidural Tray (Disposable  single use) Skin infiltration needle: Regular 1.5-in, 25-G, (x1) Block needle size: Regular standard Catheter: No catheter required    Standing Status:   Standing    Number of Occurrences:   1    Specify:   Epidural Tray   Saline lock IV    Have LR [phone] available and administer at 125 mL/hr if patient becomes hypotensive.    Standing Status:   Standing    Number of Occurrences:   1     Opioid Analgesic: No chronic opioid analgesics therapy prescribed by our practice. None MME/day: 0 mg/day    Medications ordered for procedure: Meds ordered this encounter  Medications   iohexol  (OMNIPAQUE ) 180 MG/ML injection 10 mL    Must be  Myelogram-compatible. If not available, you may substitute with a water -soluble, non-ionic, hypoallergenic, myelogram-compatible radiological contrast medium.   lidocaine  (XYLOCAINE ) 2 % (with pres) injection 400 mg   pentafluoroprop-tetrafluoroeth (GEBAUERS) aerosol   midazolam  (VERSED ) 5 MG/5ML injection 0.5-2 mg    Make  sure Flumazenil is available in the pyxis when using this medication. If oversedation occurs, administer 0.2 mg IV over 15 sec. If after 45 sec no response, administer 0.2 mg again over 1 min; may repeat at 1 min intervals; not to exceed 4 doses (1 mg)   fentaNYL (SUBLIMAZE) injection 25-50 mcg    Make sure Narcan is available in the pyxis when using this medication. In the event of respiratory depression (RR< 8/min): Titrate NARCAN (naloxone) in increments of 0.1 to 0.2 mg IV at 2-3 minute intervals, until desired degree of reversal.   sodium chloride  flush (NS) 0.9 % injection 1 mL   ropivacaine  (PF) 2 mg/mL (0.2%) (NAROPIN ) injection 1 mL   dexamethasone  (DECADRON ) injection 10 mg   Medications administered: We administered iohexol , lidocaine , pentafluoroprop-tetrafluoroeth, midazolam , fentaNYL, sodium chloride  flush, ropivacaine  (PF) 2 mg/mL (0.2%), and dexamethasone .  See the medical record for exact dosing, route, and time of administration.    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 x1 by Debby Amber, PA (11/30/2023) [facility]  Therapeutic left greater trochanteric bursa inj. x1 by Debby Amber, PA (03/26/2024) ([facility])   1(Analgesic benefit): Expressed in percentage (%). (Local anesthetic[LA] +/- sedation  L.A.Local Anesthetic  Steroid benefit  Ongoing benefit)      Follow-up plan:   Return in about 2 weeks (around 11/20/2024) for (Face2F), (PPE).     Recent Visits Date Type Provider Dept  10/29/24 Office Visit Tanya Glisson, MD [facility]  10/09/24 Procedure visit Tanya Glisson, MD [facility]  09/29/24 Office Visit Tanya Glisson, MD [facility]  09/09/24 Procedure visit Tanya Glisson, MD [facility]  08/20/24 Office Visit Tanya Glisson, MD [facility]  Showing recent visits within past 90 days and meeting all other requirements Today's Visits Date Type Provider Dept  11/06/24 Procedure visit Tanya Glisson, MD [facility]  Showing today's visits and meeting all other requirements Future Appointments Date Type Provider Dept  12/01/24 Appointment Tanya Glisson, MD [facility]  Showing future appointments within next 90 days and meeting all other requirements   Disposition: Discharge home  Discharge (Date  Time): 11/06/2024; 0920 hrs.   Primary Care Physician: Kayla Jeoffrey RAMAN, FNP Location: [facility] Note by: Glisson DELENA Tanya, MD (TTS technology used. I apologize for any typographical errors that were not detected and corrected.) Date: 11/06/2024; Time: 9:43 AM  Disclaimer:  Medicine is not an visual merchandiser. The only guarantee in medicine is that nothing is guaranteed. It is important to note that the decision to proceed with this intervention was based on the information collected from the patient. The Data and conclusions were drawn from the patient's questionnaire, the interview, and the  physical examination. Because the information was provided in large part by the patient, it cannot be guaranteed that it has not been purposely or unconsciously manipulated. Every effort has been  made to obtain as much relevant data as possible for this evaluation. It is important to note that the conclusions that lead to this procedure are derived in large part from the available data. Always take into account that the treatment will also be dependent on availability of resources and existing treatment guidelines, considered by other Pain Management Practitioners as being common knowledge and practice, at the time of the intervention. For Medico-Legal purposes, it is also important to point out that variation in procedural techniques and pharmacological choices are the acceptable norm. The indications, contraindications, technique, and results of the above procedure should only be interpreted and judged by a Board-Certified Interventional Pain Specialist with extensive familiarity and expertise in the same exact procedure and technique.

## 2024-11-07 ENCOUNTER — Telehealth: Payer: Self-pay

## 2024-11-07 NOTE — Telephone Encounter (Signed)
 Post procedure follow up.  LM

## 2024-11-10 ENCOUNTER — Ambulatory Visit (INDEPENDENT_AMBULATORY_CARE_PROVIDER_SITE_OTHER): Admitting: Podiatry

## 2024-11-10 DIAGNOSIS — G5753 Tarsal tunnel syndrome, bilateral lower limbs: Secondary | ICD-10-CM

## 2024-11-10 NOTE — Patient Instructions (Signed)

## 2024-11-12 NOTE — Progress Notes (Addendum)
°  Subjective:  Patient ID: Lindsay Harris, female    DOB: 11/18/1976,  MRN: [id number]  Chief Complaint  Patient presents with   Plantar Fasciitis    6 week follow up bilateral feet - both heels still hurting Left>right    Discussed the use of AI scribe software for clinical note transcription with the patient, who gave verbal consent to proceed.  History of Present Illness Lindsay Harris is a 48 year old female who presents with bilateral foot pain. She has been under the care of a pain management specialist, Dr. Rexann.  She returns for follow-up with little relief in the injections.  She confirms that she did the home exercises as directed twice daily for the last few weeks and still has not had any improvement with this either.      Objective:    Physical Exam VASCULAR: DP and PT pulse palpable. Foot is warm and well-perfused. Capillary fill time is brisk. DERMATOLOGIC: Normal skin turgor, texture, and temperature. No open lesions, rashes, or ulcerations. NEUROLOGIC: Altered distal sensation to light touch and pressure. No paresthesias. ORTHOPEDIC: Diffuse tenderness on the bottom of the foot bilaterally midfoot and forefoot is relatively nonspecific but she does have Sharp pain to palpation at the medial insertion of the plantar fascia bilaterally.  Positive Tinel sign at the tarsal tunnel proximal to the plantar fascia   No images are attached to the encounter.    Results LABS A1c: 6.8 (07/24/2024)  RADIOLOGY Foot X-ray: No stress fracture or fracture. Calcaneal spur on the left with calcifications within the plantar fascia. (09/24/2024)   Assessment:   1. 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  Assessment and Plan Assessment & Plan Has had little improvement with injection therapy for plantar fasciitis she has also done home physical therapy with no improvement.  At this  point I am doubtful that true plantar fasciitis is the main etiology of her pain I discussed this may be secondary to her lumbar pathology but also could be primary tarsal tunnel syndrome she is a positive Tinel sign at the tarsal tunnel I recommend an MRI to evaluate for any space-occupying lesion or neurovascular tumor.  MRIs have been ordered and I will see her back after the studies to reevaluate and for further treatment options.      Return for after MRI to review.

## 2024-11-19 ENCOUNTER — Encounter: Payer: Self-pay | Admitting: Podiatry

## 2024-11-24 ENCOUNTER — Ambulatory Visit: Admitting: Family Medicine

## 2024-11-24 ENCOUNTER — Other Ambulatory Visit

## 2024-11-24 ENCOUNTER — Telehealth: Payer: Self-pay

## 2024-11-24 NOTE — Telephone Encounter (Signed)
 PA request for bilateral MRIs is pending approval/denial. There is additional information needed. A preliminary review is being performed. Additional information stating that recently for 4 weeks patient has taken medication, done exercises and has used a splint.

## 2024-11-25 ENCOUNTER — Other Ambulatory Visit: Payer: Self-pay

## 2024-11-26 ENCOUNTER — Other Ambulatory Visit (HOSPITAL_COMMUNITY): Payer: Self-pay

## 2024-11-26 ENCOUNTER — Telehealth: Payer: Self-pay

## 2024-11-26 NOTE — Telephone Encounter (Signed)
 Received a fax regarding Prior Authorization from [facility] MEDICAID for DUPIXENT . Authorization has been DENIED because directions need to be provided for the requested drug.  Phone# [phone]   Resubmitted pa and stated instructions. Will update when we receive a response.  Key: [id number]

## 2024-11-26 NOTE — Telephone Encounter (Signed)
 Received notification via specialty pharmacy encounter that Dupixent  requires pa. Submitted a Prior Authorization request to [employer]ICA[employer] for DUPIXENT  via CoverMyMeds. Will update once we receive a response.  Key: [id number]

## 2024-11-27 ENCOUNTER — Other Ambulatory Visit (HOSPITAL_COMMUNITY): Payer: Self-pay

## 2024-11-27 ENCOUNTER — Other Ambulatory Visit: Payer: Self-pay

## 2024-11-27 NOTE — Progress Notes (Unsigned)
 PA approved until 11/26/25

## 2024-11-27 NOTE — Telephone Encounter (Signed)
 Received notification from [facility] MEDICAID regarding a prior authorization for DUPIXENT . Authorization has been APPROVED from 11/26/2024 to 11/26/2025. Approval letter sent to scan center.

## 2024-11-28 ENCOUNTER — Other Ambulatory Visit: Payer: Self-pay

## 2024-11-28 ENCOUNTER — Other Ambulatory Visit (HOSPITAL_COMMUNITY): Payer: Self-pay

## 2024-11-28 NOTE — Progress Notes (Signed)
 Clinical Intervention Note  Clinical Intervention Notes: Patient reports initiating Ozempic . No DDIs identified with Dupixent .   Clinical Intervention Outcomes: Prevention of an adverse drug event   Lindsay Harris Specialty Pharmacist

## 2024-11-28 NOTE — Progress Notes (Signed)
 Specialty Pharmacy Refill Coordination Note  MyChart Questionnaire Submission  Lindsay Harris is a 48 y.o. female contacted today regarding refills of specialty medication(s) Dupixent .  Doses on hand: 1 for 12/17   Next inj: 12/17/24  Patient requested: (Patient-Rptd) Delivery   Delivery date: 12/10/24  Verified address: [address] [place] [address]  Medication will be filled on 12/09/24

## 2024-11-30 NOTE — Progress Notes (Unsigned)
 PROVIDER NOTE: Interpretation of information contained herein should be left to medically-trained personnel. Specific patient instructions are provided elsewhere under Patient Instructions section of medical record. This document was created in part using AI and STT-dictation technology, any transcriptional errors that may result from this process are unintentional.  Patient: Lindsay Harris  Service: E/M   PCP: Kayla Jeoffrey RAMAN, FNP  DOB: 1975/12/31  DOS: 12/01/2024  Provider: Eric DELENA Como, MD  MRN: [id number]  Delivery: Face-to-face  Specialty: Interventional Pain Management  Type: Established Patient  Setting: Ambulatory outpatient facility  Specialty designation: 09  Referring Prov.: Kayla Jeoffrey RAMAN, FNP  Location: Outpatient office facility       History of present illness (HPI) Lindsay Harris, a 48 y.o. year old female, is here today because of her Cervical radiculitis [M54.12]. [name] primary complain today is No chief complaint on file.  Pertinent problems: [name] has Cervical neck pain with evidence of disc disease; Disc degeneration, lumbar; Migraines; Chronic pain syndrome; Chronic low back pain (Midline) (1ry area of Pain) w/ sciatica (Right); Abnormal MRI, lumbar spine (11/12/2023) ([facility]); Chronic lower extremity pain (Right); Cervicalgia; Chronic neck pain (Bilateral); Chronic shoulder pain (Bilateral) (R>L); Chronic lower extremity pain (Right); Cervical radiculitis (Right); Chronic low back pain (Bilateral) w/o sciatica; Low back pain of over 3 months duration; Low back pain radiating to both legs; Multifactorial low back pain; Chronic hip pain (Bilateral); Chronic lower extremity pain (Bilateral); Neck pain of over 3 months duration; Neck and shoulder pain; Posterior neck pain; Chronic neck and back pain; and DDD (degenerative disc disease), cervical on their pertinent problem list.  Pain Assessment: Severity of   is reported as a  /10. Location:    / . Onset:  .  Quality:  . Timing:  . Modifying factor(s):  SABRA Vitals:  vitals were not taken for this visit.  BMI: Estimated body mass index is 42.69 kg/m as calculated from the following:   Height as of 11/06/24: 5' 3 (1.6 m).   Weight as of 11/06/24: 241 lb (109.3 kg).  Last encounter: 11/06/2024. Last procedure: 11/06/2024.  Reason for encounter: post-procedure evaluation and assessment.   Discussed the use of AI scribe software for clinical note transcription with the patient, who gave verbal consent to proceed.  History of Present Illness          Post-Procedure Evaluation   Type: Cervical Epidural Steroid injection (CESI) (Interlaminar) #3  Laterality: Right (-RT)  Level: C7-T1 DOS: 11/06/2024  Provider: Eric DELENA Como, MD Imaging: Fluoroscopy-guided Spinal ([id number]) Anesthesia: Local anesthesia (1-2% Lidocaine ) Anxiolysis: IV Versed   3.0 mg           Sedation: Minimal Sedation Fentanyl  1 mL (50 mcg)  Medical Necessity Purpose: Diagnostic/Therapeutic Rationale (medical necessity): procedure needed and proper for the diagnosis and/or treatment of [name]'s medical symptoms and needs. Indications: Cervicalgia, cervical radicular pain, degenerative disc disease, severe enough to impact quality of life or function. 1. Cervical radiculitis (Right)   2. Cervical neck pain with evidence of disc disease   3. Cervicalgia   4. Chronic neck and back pain   5. Chronic neck pain (Bilateral)   6. DDD (degenerative disc disease), cervical   7. Neck and shoulder pain   8. Neck pain of over 3 months duration   9. Posterior neck pain    NAS-11 Pain score:   Pre-procedure: 8 /10   Post-procedure: 0-No pain/10     Effectiveness:  Initial hour after procedure:   ***.  Subsequent 4-6 hours post-procedure:   ***. Analgesia past initial 6 hours:   ***. Ongoing improvement:  Analgesic:  *** Function:    ***    ROM:    ***    Interpretation: ***  Pharmacotherapy Assessment   Opioid  Analgesic: No chronic opioid analgesics therapy prescribed by our practice. None MME/day: 0 mg/day   Monitoring: [place] PMP: PDMP reviewed during this encounter.       Pharmacotherapy: No side-effects or adverse reactions reported. Compliance: No problems identified. Effectiveness: Clinically acceptable.  No notes on file  UDS:  Summary  Date Value Ref Range Status  07/30/2024 FINAL  Final    Comment:    ==================================================================== Compliance Drug Analysis, Ur ==================================================================== Test                             Result       Flag       Units  Drug Present   Carboxy-THC                    81                      ng/mg creat    Carboxy-THC is a metabolite of tetrahydrocannabinol (THC). Source of    THC is most commonly herbal marijuana or marijuana-based products,    but THC is also present in a scheduled prescription medication.    Trace amounts of THC can be present in hemp and cannabidiol (CBD)    products. This test is not intended to distinguish between delta-9-    tetrahydrocannabinol, the predominant form of THC in most herbal or    marijuana-based products, and delta-8-tetrahydrocannabinol.    Gabapentin                      PRESENT   Cyclobenzaprine                PRESENT   Desmethylcyclobenzaprine       PRESENT    Desmethylcyclobenzaprine is an expected metabolite of    cyclobenzaprine.    Duloxetine                      PRESENT   Propranolol                     PRESENT ==================================================================== Test                      Result    Flag   Units      Ref Range   Creatinine              187              mg/dL      >=79 ==================================================================== Declared Medications:  Medication list was not provided. ==================================================================== For clinical consultation,  please call [phone]. ====================================================================     No results found for: CBDTHCR No results found for: D8THCCBX No results found for: D9THCCBX  ROS  Constitutional: Denies any fever or chills Gastrointestinal: No reported hemesis, hematochezia, vomiting, or acute GI distress Musculoskeletal: Denies any acute onset joint swelling, redness, loss of ROM, or weakness Neurological: No reported episodes of acute onset apraxia, aphasia, dysarthria, agnosia, amnesia, paralysis, loss of coordination, or loss of consciousness  Medication Review  AeroChamber MV, B-12, DULoxetine , Dupilumab , EPINEPHrine, Elagolix Sodium , SUMAtriptan, Semaglutide (0.25 or 0.5MG /DOS), Simethicone, Tiotropium Bromide ,  Vibegron , Vitamin D  (Ergocalciferol ), albuterol , budesonide -formoterol , cyclobenzaprine, eszopiclone, ezetimibe , furosemide, gabapentin , lactulose , linaclotide , metFORMIN , montelukast , ondansetron , pantoprazole , propranolol , propranolol  ER, and rosuvastatin   History Review  Allergy: Ms. Giacobbe is allergic to amoxicillin. Drug: Ms. Parmar  reports no history of drug use. Alcohol:  reports no history of alcohol use. Tobacco:  reports that she has been smoking cigarettes. She started smoking about 38 years ago. She has a 39 pack-year smoking history. She does not have any smokeless tobacco history on file. Social: Ms. Krahn  reports that she has been smoking cigarettes. She started smoking about 38 years ago. She has a 39 pack-year smoking history. She does not have any smokeless tobacco history on file. She reports that she does not drink alcohol and does not use drugs. Medical:  has a past medical history of Asthma (03/1987), BRCA negative (10/2024), COPD (chronic obstructive pulmonary disease) (HCC), Dyspnea, Family history of breast cancer, Family history of colon cancer, Family history of ovarian cancer, GERD (gastroesophageal reflux disease), Headache,  Increased risk of breast cancer (10/2024), Plantar fascia syndrome (09/24/2024), and Thyroid disease ([id number]). Surgical: Ms. Cavitt  has a past surgical history that includes Colonoscopy with propofol  (N/A, 08/09/2023) and polypectomy (N/A, 08/09/2023). Family: family history includes Anemia in her mother; Asthma in her maternal grandfather and mother; Breast cancer in her maternal grandmother; Colon cancer in her maternal uncle; Colon cancer (age of onset: 21) in her mother; Heart attack in her maternal grandfather; Heart disease in her maternal grandfather; Heart failure in her maternal grandfather; Hypertension in her maternal grandfather; Ovarian cancer (age of onset: 75) in her sister.  Laboratory Chemistry Profile   Renal Lab Results  Component Value Date   BUN 10 10/22/2024   CREATININE 0.83 10/22/2024   LABCREA 116 08/05/2024   BCR SEE NOTE: 10/22/2024   GFRAA >60 12/30/2015   GFRNONAA >60 05/08/2024    Hepatic Lab Results  Component Value Date   AST 31 10/22/2024   ALT 49 (H) 10/22/2024   ALBUMIN 3.8 (L) 07/30/2024   ALKPHOS 114 07/30/2024   LIPASE 38 06/07/2023    Electrolytes Lab Results  Component Value Date   NA 139 10/22/2024   K 4.2 10/22/2024   CL 104 10/22/2024   CALCIUM  9.2 10/22/2024   MG 2.1 07/30/2024    Bone Lab Results  Component Value Date   VD25OH 18 (L) 10/27/2024   25OHVITD1 20 (L) 07/30/2024   25OHVITD2 <1.0 07/30/2024   25OHVITD3 20 07/30/2024    Inflammation (CRP: Acute Phase) (ESR: Chronic Phase) Lab Results  Component Value Date   CRP 12 (H) 07/30/2024   ESRSEDRATE 44 (H) 07/30/2024   LATICACIDVEN 1.4 08/23/2022         Note: Above Lab results reviewed.  Recent Imaging Review  DG PAIN CLINIC C-ARM 1-60 MIN NO REPORT Fluoro was used, but no Radiologist interpretation will be provided.  Please refer to NOTES tab for provider progress note. Note: Reviewed        Physical Exam  Vitals: LMP 08/19/2023  BMI: Estimated body mass  index is 42.69 kg/m as calculated from the following:   Height as of 11/06/24: 5' 3 (1.6 m).   Weight as of 11/06/24: 241 lb (109.3 kg). Ideal: Patient weight not recorded General appearance: Well nourished, well developed, and well hydrated. In no apparent acute distress Mental status: Alert, oriented x 3 (person, place, & time)       Respiratory: No evidence of acute respiratory distress Eyes: PERLA   Assessment  Diagnosis Status  1. Cervical radiculitis (Right)   2. Cervical neck pain with evidence of disc disease   3. Cervicalgia   4. Chronic neck and back pain   5. Chronic neck pain (Bilateral)   6. Postop check    Controlled Controlled Controlled   Updated Problems: No problems updated.  Plan of Care  Problem-specific:  Assessment and Plan            Ms. ONICA DAVIDOVICH has a current medication list which includes the following long-term medication(s): albuterol , albuterol , duloxetine , dupixent , eszopiclone, ezetimibe , furosemide, linzess , metformin , montelukast , pantoprazole , propranolol , propranolol  er, rosuvastatin , simethicone, spiriva  handihaler, sumatriptan, and symbicort .  Pharmacotherapy (Medications Ordered): No orders of the defined types were placed in this encounter.  Orders:  No orders of the defined types were placed in this encounter.    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 x1 by Debby Amber, PA (11/30/2023) [facility])  Therapeutic left greater trochanteric bursa inj. x1 by Debby Amber, PA (03/26/2024) ([facility])   1(Analgesic benefit): Expressed in percentage (%). (Local anesthetic[LA] +/- sedation  L.A.Local Anesthetic  Steroid benefit  Ongoing benefit)     No follow-ups on file.    Recent Visits Date Type Provider Dept  11/06/24 Procedure visit Tanya Glisson, MD [facility]  10/29/24 Office Visit Tanya Glisson, MD [facility]  10/09/24 Procedure visit Tanya Glisson, MD [facility]  09/29/24 Office Visit Tanya Glisson, MD [facility]  09/09/24 Procedure visit Tanya Glisson, MD [facility]  Showing recent visits within past 90 days and meeting all other requirements Future Appointments Date Type Provider Dept  12/01/24 Appointment Tanya Glisson, MD [facility]  Showing future appointments within next 90 days and meeting all other requirements  I discussed the assessment and treatment plan with the patient. The patient was provided an opportunity to ask questions and all were answered. The patient agreed with the plan and demonstrated an understanding of the instructions.  Patient advised to call back or seek an in-person evaluation if the symptoms or condition worsens.  Duration of encounter: *** minutes.  Total time on encounter, as per AMA guidelines included both the face-to-face and non-face-to-face time personally spent by the physician and/or other qualified health care professional(s) on the day of the encounter (includes time in activities that require the physician or other qualified health care professional and does not include time in activities normally performed by clinical staff). Physician's time may include the following activities when performed: Preparing to see the patient (e.g., pre-charting review of  records, searching for previously ordered imaging, lab work, and nerve conduction tests) Review of prior analgesic pharmacotherapies. Reviewing PMP Interpreting ordered tests (e.g., lab work, imaging, nerve conduction tests) Performing post-procedure evaluations, including interpretation of diagnostic procedures Obtaining and/or reviewing separately obtained history Performing a medically appropriate examination and/or evaluation Counseling and educating the patient/family/caregiver Ordering medications, tests,  or procedures Referring and communicating with other health care professionals (when not separately reported) Documenting clinical information in the electronic or other health record Independently interpreting results (not separately reported) and communicating results to the patient/ family/caregiver Care coordination (not separately reported)  Note by: Eric DELENA Como, MD (TTS and AI technology used. I apologize for any typographical errors that were not detected and corrected.) Date: 12/01/2024; Time: 12:31 PM

## 2024-12-01 ENCOUNTER — Ambulatory Visit: Admitting: Pain Medicine

## 2024-12-01 DIAGNOSIS — G8929 Other chronic pain: Secondary | ICD-10-CM

## 2024-12-01 DIAGNOSIS — M542 Cervicalgia: Secondary | ICD-10-CM

## 2024-12-01 DIAGNOSIS — Z91199 Patient's noncompliance with other medical treatment and regimen due to unspecified reason: Secondary | ICD-10-CM

## 2024-12-01 DIAGNOSIS — M5412 Radiculopathy, cervical region: Secondary | ICD-10-CM

## 2024-12-01 DIAGNOSIS — Z09 Encounter for follow-up examination after completed treatment for conditions other than malignant neoplasm: Secondary | ICD-10-CM

## 2024-12-01 DIAGNOSIS — M5090 Cervical disc disorder, unspecified, unspecified cervical region: Secondary | ICD-10-CM

## 2024-12-02 ENCOUNTER — Other Ambulatory Visit: Payer: Self-pay | Admitting: Obstetrics & Gynecology

## 2024-12-02 ENCOUNTER — Telehealth: Payer: Self-pay | Admitting: Obstetrics & Gynecology

## 2024-12-02 DIAGNOSIS — Z9189 Other specified personal risk factors, not elsewhere classified: Secondary | ICD-10-CM

## 2024-12-02 NOTE — Progress Notes (Signed)
 MRI ordered due to increased risk of lifetime breast cancer (21%).

## 2024-12-02 NOTE — Telephone Encounter (Signed)
 I called to discuss her MyRisk genetic results. She does not have a clinically significant mutation but based on her fh she does have a lifetime risk of breast cancer of 21%. This is increased risk and she will need increased surveillence. I have ordered a mammogram for 03/2025 (6 months after her mammogram). I have also rec'd SBE monthly. All questions anwered.

## 2024-12-03 ENCOUNTER — Encounter: Payer: Self-pay | Admitting: Family Medicine

## 2024-12-03 ENCOUNTER — Ambulatory Visit: Admitting: Family Medicine

## 2024-12-03 VITALS — BP 118/70 | HR 76 | Temp 98.0°F | Ht 63.0 in | Wt 233.6 lb

## 2024-12-03 DIAGNOSIS — E1169 Type 2 diabetes mellitus with other specified complication: Secondary | ICD-10-CM

## 2024-12-03 DIAGNOSIS — F339 Major depressive disorder, recurrent, unspecified: Secondary | ICD-10-CM | POA: Insufficient documentation

## 2024-12-03 DIAGNOSIS — Z7985 Long-term (current) use of injectable non-insulin antidiabetic drugs: Secondary | ICD-10-CM

## 2024-12-03 DIAGNOSIS — E119 Type 2 diabetes mellitus without complications: Secondary | ICD-10-CM | POA: Insufficient documentation

## 2024-12-03 DIAGNOSIS — Z803 Family history of malignant neoplasm of breast: Secondary | ICD-10-CM | POA: Diagnosis not present

## 2024-12-03 DIAGNOSIS — F17210 Nicotine dependence, cigarettes, uncomplicated: Secondary | ICD-10-CM | POA: Insufficient documentation

## 2024-12-03 DIAGNOSIS — E785 Hyperlipidemia, unspecified: Secondary | ICD-10-CM | POA: Diagnosis not present

## 2024-12-03 MED ORDER — OZEMPIC (0.25 OR 0.5 MG/DOSE) 2 MG/3ML ~~LOC~~ SOPN: 0.5000 mg | PEN_INJECTOR | SUBCUTANEOUS | 0 refills | Status: DC

## 2024-12-03 NOTE — Progress Notes (Signed)
 Acute Office Visit  Patient ID: Lindsay [facility], female    DOB: Jun 16, 1976, 48 y.o.   MRN: [id number]  PCP: [facility] RAMAN, FNP  Chief Complaint  Patient presents with   medication follow up    Ozempic  follow up - no C/O.     Subjective:     HPI  Lind[facility] is here today to follow up on her diabetes after initiating Ozempic  4 weeks ago. Her last A1c was 6.8%. She was also started on Zetia  10mg  daily in addition to her Rosuvastatin  40mg  daily. Will repeat lipid panel today.  Discussed the use of AI scribe software for clinical note transcription with the patient, who gave verbal consent to proceed.  History of Present Illness Lindsay Harris is a 48 year old female with type 2 diabetes and hyperlipidemia who presents for follow-up on medication management.  She has been on Ozempic  for four weeks at a dose of 0.25 mg with no side effects such as upset stomach. Blood sugars have been stable with some improvement, though her fasting blood sugar this morning was 168 mg/dL, higher than usual.  She has started Zetia  for her cholesterol and reports no side effects. Her last cholesterol check was in November, and she is due for a recheck today. She is currently on rosuvastatin  40 mg, the highest dose. Her appetite has decreased significantly since starting Ozempic .  She has a family history of breast cancer and carries genetic markers associated with increased risk. She is scheduled for an MRI due to her genetic markers associated with increased breast cancer risk. This news has caused her significant anxiety.  She is currently taking duloxetine , but it is not helping her. She feels overwhelmed and is considering seeing a therapist to help manage her stress and mental health. She is trying to quit smoking and has reduced her intake to one cigarette today.  She lives close to the clinic and prefers it due to the convenience compared to traveling to other locations. Long rides are  uncomfortable for her back. She is on Medicaid and is looking for a therapist who accepts her insurance.   Review of Systems  All other systems reviewed and are negative.   Past Medical History:  Diagnosis Date   Asthma 03/1987   BRCA negative 10/2024   MyRisk neg   COPD (chronic obstructive pulmonary disease) (HCC)    Dyspnea    Family history of breast cancer    Family history of colon cancer    Family history of ovarian cancer    GERD (gastroesophageal reflux disease)    Headache    migraines   Increased risk of breast cancer 10/2024   IBIS=21.3%/riskscore=16.8%   Plantar fascia syndrome 09/24/2024   B/L   Thyroid disease [id number]    Past Surgical History:  Procedure Laterality Date   COLONOSCOPY WITH PROPOFOL  N/A 08/09/2023   Procedure: COLONOSCOPY WITH BIOPSY;  Surgeon: Lindsay Corinn Skiff, MD;  Location: [facility];  Service: Endoscopy;  Laterality: N/A;   POLYPECTOMY N/A 08/09/2023   Procedure: POLYPECTOMY;  Surgeon: Lindsay Corinn Skiff, MD;  Location: [facility];  Service: Endoscopy;  Laterality: N/A;    Outpatient Medications Prior to Visit  Medication Sig Dispense Refill   albuterol  (PROVENTIL ) (2.5 MG/3ML) 0.083% nebulizer solution Take 2.5 mg by nebulization every 4 (four) hours as needed.     albuterol  (VENTOLIN  HFA) 108 (90 Base) MCG/ACT inhaler Inhale 2 puffs into the lungs every 6 (six) hours as needed.  3 each 3   Cyanocobalamin (B-12) 5000 MCG CAPS Take 1 tablet by mouth daily.     cyclobenzaprine (FLEXERIL) 5 MG tablet Take 1 tablet (5 mg total) by mouth 3 (three) times daily as needed for muscle spasms. 90 tablet 3   cyclobenzaprine (FLEXERIL) 5 MG tablet Take 5 mg by mouth 3 (three) times daily as needed for muscle spasms.     DULoxetine  (CYMBALTA ) 60 MG capsule Take 60 mg by mouth 2 (two) times daily.     Dupilumab  (DUPIXENT ) 300 MG/2ML SOAJ Inject 300 mg into the skin every 14 (fourteen) days. 12 mL 1   Elagolix Sodium  (ORILISSA ) 200 MG  TABS Take 1 tablet (200 mg total) by mouth 2 (two) times daily. 60 tablet 12   EPINEPHrine 0.3 mg/0.3 mL IJ SOAJ injection Inject 0.3 mg into the muscle as needed for anaphylaxis.     eszopiclone (LUNESTA) 1 MG TABS tablet Take 1 mg by mouth at bedtime as needed.     ezetimibe  (ZETIA ) 10 MG tablet Take 1 tablet (10 mg total) by mouth daily. 90 tablet 3   furosemide (LASIX) 20 MG tablet Take 20 mg by mouth daily as needed.     gabapentin  (NEURONTIN ) 100 MG capsule Take 400 mg by mouth 2 (two) times daily. Take 400mg  in the morning and 400mg  in the afternoon.     gabapentin  (NEURONTIN ) 600 MG tablet Take 600 mg by mouth at bedtime.     lactulose  (CHRONULAC ) 10 GM/15ML solution Take 30 mLs (20 g total) by mouth daily. 900 mL 3   linaclotide  (LINZESS ) 290 MCG CAPS capsule TAKE 1 CAPSULE BY MOUTH DAILY BEFORE BREAKFAST. 90 capsule 0   metFORMIN  (GLUCOPHAGE -XR) 500 MG 24 hr tablet Take 1 tablet (500 mg total) by mouth daily with breakfast. 90 tablet 1   montelukast  (SINGULAIR ) 10 MG tablet Take 10 mg by mouth at bedtime.     ondansetron  (ZOFRAN -ODT) 4 MG disintegrating tablet Take 1 tablet (4 mg total) by mouth every 8 (eight) hours as needed for nausea. 30 tablet 0   pantoprazole  (PROTONIX ) 40 MG tablet TAKE 1 TABLET BY MOUTH EVERY DAY FOR HEARTBURN, TAKE 1 TAB TWICE DAILY FOR THE FIRST 2 WEEK 90 tablet 2   propranolol  (INDERAL ) 20 MG tablet TAKE 1 TABLET BY MOUTH 3 TIMES DAILY AS NEEDED. 270 tablet 1   propranolol  ER (INDERAL  LA) 120 MG 24 hr capsule Take 120 mg by mouth daily.     rosuvastatin  (CRESTOR ) 40 MG tablet Take 1 tablet (40 mg total) by mouth daily. 90 tablet 1   Simethicone 180 MG CAPS Take 1 capsule by mouth 3 (three) times daily as needed.     Spacer/Aero-Holding Chambers (AEROCHAMBER MV) inhaler Use as instructed 1 each 0   SPIRIVA  HANDIHALER 18 MCG inhalation capsule Place 1 capsule (18 mcg total) into inhaler and inhale daily. 90 capsule 3   SUMAtriptan (IMITREX) 25 MG tablet Take 25  mg by mouth every 2 (two) hours as needed.     SYMBICORT  160-4.5 MCG/ACT inhaler INHALE 2 PUFFS INTO THE LUNGS IN THE MORNING AND AT BEDTIME. 30.6 each 4   Vibegron  (GEMTESA ) 75 MG TABS Take 1 tablet (75 mg total) by mouth daily. 42 tablet    Vibegron  (GEMTESA ) 75 MG TABS Take 1 tablet (75 mg total) by mouth daily. 30 tablet 11   Vitamin D , Ergocalciferol , (DRISDOL ) 1.25 MG (50000 UNIT) CAPS capsule Take by mouth.     Semaglutide ,0.25 or 0.5MG /DOS, (OZEMPIC , 0.25 OR  0.5 MG/DOSE,) 2 MG/3ML SOPN Inject 0.25 mg into the skin once a week. 3 mL 0   No facility-administered medications prior to visit.    Allergies[1]     Objective:    BP 118/70   Pulse 76   Temp 98 F (36.7 C)   Ht 5' 3 (1.6 m)   Wt 233 lb 9.6 oz (106 kg)   LMP 08/19/2023   SpO2 97%   BMI 41.38 kg/m  BP Readings from Last 3 Encounters:  12/03/24 118/70  11/06/24 (!) 95/52  10/29/24 121/88   Wt Readings from Last 3 Encounters:  12/03/24 233 lb 9.6 oz (106 kg)  11/06/24 241 lb (109.3 kg)  10/29/24 241 lb (109.3 kg)      Physical Exam Vitals and nursing note reviewed.  Constitutional:      Appearance: Normal appearance. She is obese.     Comments: O2 via nasal cannula  HENT:     Head: Normocephalic and atraumatic.  Cardiovascular:     Rate and Rhythm: Normal rate and regular rhythm.     Pulses: Normal pulses.     Heart sounds: Normal heart sounds.  Pulmonary:     Effort: Pulmonary effort is normal.     Breath sounds: Normal breath sounds.  Skin:    General: Skin is warm and dry.  Neurological:     General: No focal deficit present.     Mental Status: She is alert and oriented to person, place, and time. Mental status is at baseline.  Psychiatric:        Mood and Affect: Mood normal. Affect is tearful.        Behavior: Behavior normal.        Thought Content: Thought content normal.        Judgment: Judgment normal.       No results found for any visits on 12/03/24.     Assessment & Plan:    Problem List Items Addressed This Visit       Endocrine   Hyperlipidemia associated with type 2 diabetes mellitus (HCC)   Relevant Medications   Semaglutide ,0.25 or 0.5MG /DOS, (OZEMPIC , 0.25 OR 0.5 MG/DOSE,) 2 MG/3ML SOPN   Other Relevant Orders   Lipid panel   Comprehensive metabolic panel with GFR   Diabetes mellitus treated with injections of non-insulin  medication (HCC) - Primary   Relevant Medications   Semaglutide ,0.25 or 0.5MG /DOS, (OZEMPIC , 0.25 OR 0.5 MG/DOSE,) 2 MG/3ML SOPN   Other Relevant Orders   Lipid panel   Comprehensive metabolic panel with GFR     Other   Family history of breast cancer   Cigarette nicotine  dependence without complication   Depression, recurrent   Relevant Orders   Ambulatory referral to Psychology    Assessment and Plan Assessment & Plan Type 2 diabetes mellitus Blood sugars stable, fasting level 168 mg/dL, J8r 7%. No significant side effects from Ozempic . - Increase Ozempic  to 0.5 mg for four weeks, then consider further increase. - Ensure adequate protein intake to maintain muscle mass. - Recheck cholesterol and metabolic panel today.  Hyperlipidemia associated with type 2 diabetes mellitus On rosuvastatin  40 mg and Zetia . No side effects. Cholesterol levels to be rechecked today. Consider injectable cholesterol medication if levels remain high. - Recheck cholesterol levels today. - Continue rosuvastatin  40 mg and Zetia .  Major depressive disorder and generalized anxiety disorder Duloxetine  not effective. Significant stress due to health issues and family history of breast cancer. No current therapist. - Refer to a therapist  for additional support. - Encourage finding a therapist that accepts Medicaid. - Encourage engaging in hobbies and activities that bring joy.  Nicotine  dependence Smoking one cigarette today due to stress. Acknowledges need to quit smoking. - Encourage smoking cessation efforts.  Morbid obesity On Ozempic   for weight management. Reports decreased appetite and reduced food intake. Emphasis on maintaining protein intake to prevent muscle wasting. - Continue Ozempic  with dose adjustment as planned. - Ensure adequate protein intake to maintain muscle mass.    Meds ordered this encounter  Medications   Semaglutide ,0.25 or 0.5MG /DOS, (OZEMPIC , 0.25 OR 0.5 MG/DOSE,) 2 MG/3ML SOPN    Sig: Inject 0.5 mg into the skin once a week.    Dispense:  3 mL    Refill:  0    Supervising Provider:   DUANNE LOWERS T [[id number]]    Return in about 9 weeks (around 02/04/2025) for chronic follow-up with labs 1 week prior.  Lindsay GORMAN Barrio, FNP [facility]      [1]  Allergies Allergen Reactions   Amoxicillin Anaphylaxis and Hives

## 2024-12-04 LAB — LIPID PANEL
Cholesterol: 208 mg/dL — ABNORMAL HIGH (ref <200)
HDL: 45 mg/dL — ABNORMAL LOW (ref >=50)
LDL Cholesterol (Calc): 131 mg/dL — ABNORMAL HIGH
Non-HDL Cholesterol (Calc): 163 mg/dL — ABNORMAL HIGH (ref <130)
Total CHOL/HDL Ratio: 4.6 (calc) (ref <5.0)
Triglycerides: 184 mg/dL — ABNORMAL HIGH (ref <150)

## 2024-12-04 LAB — COMPREHENSIVE METABOLIC PANEL WITH GFR
AG Ratio: 1.3 (calc) (ref 1.0–2.5)
ALT: 49 U/L — ABNORMAL HIGH (ref 6–29)
AST: 30 U/L (ref 10–35)
Albumin: 4 g/dL (ref 3.6–5.1)
Alkaline phosphatase (APISO): 94 U/L (ref 31–125)
BUN: 9 mg/dL (ref 7–25)
CO2: 27 mmol/L (ref 20–32)
Calcium: 9.5 mg/dL (ref 8.6–10.2)
Chloride: 105 mmol/L (ref 98–110)
Creat: 0.9 mg/dL (ref 0.50–0.99)
Globulin: 3 g/dL (ref 1.9–3.7)
Glucose, Bld: 103 mg/dL — ABNORMAL HIGH (ref 65–99)
Potassium: 4.7 mmol/L (ref 3.5–5.3)
Sodium: 139 mmol/L (ref 135–146)
Total Bilirubin: 0.3 mg/dL (ref 0.2–1.2)
Total Protein: 7 g/dL (ref 6.1–8.1)
eGFR: 79 mL/min/1.73m2 (ref >=60)

## 2024-12-04 NOTE — Telephone Encounter (Signed)
 LVM for pt to return call.  Pt will needs to come to the office to sign paper for PA appeal for gemtesa .

## 2024-12-09 ENCOUNTER — Other Ambulatory Visit: Payer: Self-pay

## 2024-12-26 ENCOUNTER — Other Ambulatory Visit: Payer: Self-pay | Admitting: Family Medicine

## 2024-12-29 ENCOUNTER — Ambulatory Visit: Admitting: Pain Medicine

## 2024-12-30 ENCOUNTER — Other Ambulatory Visit: Payer: Self-pay | Admitting: Pulmonary Disease

## 2024-12-30 ENCOUNTER — Other Ambulatory Visit (HOSPITAL_COMMUNITY): Payer: Self-pay

## 2024-12-30 DIAGNOSIS — J4489 Other specified chronic obstructive pulmonary disease: Secondary | ICD-10-CM

## 2024-12-31 ENCOUNTER — Other Ambulatory Visit: Payer: Self-pay

## 2024-12-31 MED ORDER — DUPIXENT 300 MG/2ML ~~LOC~~ SOAJ
300.0000 mg | SUBCUTANEOUS | 1 refills | Status: AC
  Filled 2024-12-31: qty 12, 84d supply, fill #0
  Filled 2024-12-31: qty 4, 28d supply, fill #0

## 2025-01-02 ENCOUNTER — Other Ambulatory Visit (HOSPITAL_COMMUNITY): Payer: Self-pay

## 2025-01-04 NOTE — Progress Notes (Unsigned)
 PROVIDER NOTE: Interpretation of information contained herein should be left to medically-trained personnel. Specific patient instructions are provided elsewhere under Patient Instructions section of medical record. This document was created in part using AI and STT-dictation technology, any transcriptional errors that may result from this process are unintentional.  Patient: Lindsay Harris  Service: E/M   PCP: Lindsay Jeoffrey RAMAN, FNP  DOB: [birth year]  DOS: 01/05/2025  Provider: Eric DELENA Como, MD  MRN: [id number]  Delivery: Face-to-face  Specialty: Interventional Pain Management  Type: Established Patient  Setting: Ambulatory outpatient facility  Specialty designation: 09  Referring Prov.: Lindsay Jeoffrey RAMAN, FNP  Location: Outpatient office facility       History of present illness (HPI) [name], a 49 y.o. year old female, is here today because of her Chronic neck and back pain [M54.2, M54.9, G89.29]. Lindsay Harris primary complain today is No chief complaint on file.  Pertinent problems: Lindsay Harris has Cervical neck pain with evidence of disc disease; Disc degeneration, lumbar; Migraines; Chronic pain syndrome; Chronic low back pain (Midline) (1ry area of Pain) w/ sciatica (Right); Abnormal MRI, lumbar spine (11/12/2023) ([facility]); Chronic lower extremity pain (Right); Cervicalgia; Chronic neck pain (Bilateral); Chronic shoulder pain (Bilateral) (R>L); Chronic lower extremity pain (Right); Cervical radiculitis (Right); Chronic low back pain (Bilateral) w/o sciatica; Low back pain of over 3 months duration; Low back pain radiating to both legs; Multifactorial low back pain; Chronic hip pain (Bilateral); Chronic lower extremity pain (Bilateral); Neck pain of over 3 months duration; Neck and shoulder pain; Posterior neck pain; Chronic neck and back pain; and DDD (degenerative disc disease), cervical on their pertinent problem list.  Pain Assessment: Severity of   is reported as a  /10.  Location:    / . Onset:  . Quality:  . Timing:  . Modifying factor(s):  SABRA Vitals:  vitals were not taken for this visit.  BMI: Estimated body mass index is 41.38 kg/m as calculated from the following:   Height as of 12/03/24: 5' 3 (1.6 m).   Weight as of 12/03/24: 233 lb 9.6 oz (106 kg).  Last encounter: 10/29/2024. Last procedure: 11/06/2024.  Reason for encounter: post-procedure evaluation and assessment.   Discussed the use of AI scribe software for clinical note transcription with the patient, who gave verbal consent to proceed.  History of Present Illness          Post-Procedure Evaluation   Type: Cervical Epidural Steroid injection (CESI) (Interlaminar) #3  Laterality: Right (-RT)  Level: C7-T1 DOS: 11/06/2024  Provider: Eric DELENA Como, MD Imaging: Fluoroscopy-guided Spinal ([id number]) Anesthesia: Local anesthesia (1-2% Lidocaine ) Anxiolysis: IV Versed   3.0 mg           Sedation: Minimal Sedation Fentanyl  1 mL (50 mcg)  Medical Necessity Purpose: Diagnostic/Therapeutic Rationale (medical necessity): procedure needed and proper for the diagnosis and/or treatment of Lindsay Harris's medical symptoms and needs. Indications: Cervicalgia, cervical radicular pain, degenerative disc disease, severe enough to impact quality of life or function. 1. Cervical radiculitis (Right)   2. Cervical neck pain with evidence of disc disease   3. Cervicalgia   4. Chronic neck and back pain   5. Chronic neck pain (Bilateral)   6. DDD (degenerative disc disease), cervical   7. Neck and shoulder pain   8. Neck pain of over 3 months duration   9. Posterior neck pain    NAS-11 Pain score:   Pre-procedure: 8 /10   Post-procedure: 0-No pain/10     Effectiveness:  Initial hour after procedure:   ***. Subsequent 4-6 hours post-procedure:   ***. Analgesia past initial 6 hours:   ***. Ongoing improvement:  Analgesic:  *** Function:    ***    ROM:    ***    Interpretation:  ***  Pharmacotherapy Assessment   Opioid Analgesic: No chronic opioid analgesics therapy prescribed by our practice. None MME/day: 0 mg/day   Monitoring: [place] PMP: PDMP reviewed during this encounter.       Pharmacotherapy: No side-effects or adverse reactions reported. Compliance: No problems identified. Effectiveness: Clinically acceptable.  No notes on file  UDS:  Summary  Date Value Ref Range Status  07/30/2024 FINAL  Final    Comment:    ==================================================================== Compliance Drug Analysis, Ur ==================================================================== Test                             Result       Flag       Units  Drug Present   Carboxy-THC                    81                      ng/mg creat    Carboxy-THC is a metabolite of tetrahydrocannabinol (THC). Source of    THC is most commonly herbal marijuana or marijuana-based products,    but THC is also present in a scheduled prescription medication.    Trace amounts of THC can be present in hemp and cannabidiol (CBD)    products. This test is not intended to distinguish between delta-9-    tetrahydrocannabinol, the predominant form of THC in most herbal or    marijuana-based products, and delta-8-tetrahydrocannabinol.    Gabapentin                      PRESENT   Cyclobenzaprine                PRESENT   Desmethylcyclobenzaprine       PRESENT    Desmethylcyclobenzaprine is an expected metabolite of    cyclobenzaprine.    Duloxetine                      PRESENT   Propranolol                     PRESENT ==================================================================== Test                      Result    Flag   Units      Ref Range   Creatinine              187              mg/dL      >=79 ==================================================================== Declared Medications:  Medication list was not  provided. ==================================================================== For clinical consultation, please call [phone]. ====================================================================     No results found for: CBDTHCR No results found for: D8THCCBX No results found for: D9THCCBX  ROS  Constitutional: Denies any fever or chills Gastrointestinal: No reported hemesis, hematochezia, vomiting, or acute GI distress Musculoskeletal: Denies any acute onset joint swelling, redness, loss of ROM, or weakness Neurological: No reported episodes of acute onset apraxia, aphasia, dysarthria, agnosia, amnesia, paralysis, loss of coordination, or loss of consciousness  Medication Review  AeroChamber MV, B-12, DULoxetine , Dupilumab , EPINEPHrine, Elagolix Sodium ,  SUMAtriptan, Semaglutide (0.25 or 0.5MG /DOS), Simethicone, Tiotropium Bromide , Vibegron , Vitamin D  (Ergocalciferol ), albuterol , budesonide -formoterol , cyclobenzaprine, eszopiclone, ezetimibe , furosemide, gabapentin , lactulose , linaclotide , metFORMIN , montelukast , ondansetron , pantoprazole , propranolol , propranolol  ER, and rosuvastatin   History Review  Allergy: Ms. Guallpa is allergic to amoxicillin. Drug: Ms. Pendley  reports no history of drug use. Alcohol:  reports no history of alcohol use. Tobacco:  reports that she has been smoking cigarettes. She started smoking about 39 years ago. She has a 39 pack-year smoking history. She does not have any smokeless tobacco history on file. Social: Ms. Skufca  reports that she has been smoking cigarettes. She started smoking about 39 years ago. She has a 39 pack-year smoking history. She does not have any smokeless tobacco history on file. She reports that she does not drink alcohol and does not use drugs. Medical:  has a past medical history of Asthma (03/1987), BRCA negative (10/2024), COPD (chronic obstructive pulmonary disease) (HCC), Dyspnea, Family history of breast cancer, Family  history of colon cancer, Family history of ovarian cancer, GERD (gastroesophageal reflux disease), Headache, Increased risk of breast cancer (10/2024), Plantar fascia syndrome (09/24/2024), and Thyroid disease ([id number]). Surgical: Ms. Kolden  has a past surgical history that includes Colonoscopy with propofol  (N/A, 08/09/2023) and polypectomy (N/A, 08/09/2023). Family: family history includes Anemia in her mother; Asthma in her maternal grandfather and mother; Breast cancer in her maternal grandmother; Colon cancer in her maternal uncle; Colon cancer (age of onset: 33) in her mother; Heart attack in her maternal grandfather; Heart disease in her maternal grandfather; Heart failure in her maternal grandfather; Hypertension in her maternal grandfather; Ovarian cancer (age of onset: 50) in her sister.  Laboratory Chemistry Profile   Renal Lab Results  Component Value Date   BUN 9 12/03/2024   CREATININE 0.90 12/03/2024   LABCREA 116 08/05/2024   BCR SEE NOTE: 12/03/2024   GFRAA >60 12/30/2015   GFRNONAA >60 05/08/2024    Hepatic Lab Results  Component Value Date   AST 30 12/03/2024   ALT 49 (H) 12/03/2024   ALBUMIN 3.8 (L) 07/30/2024   ALKPHOS 114 07/30/2024   LIPASE 38 06/07/2023    Electrolytes Lab Results  Component Value Date   NA 139 12/03/2024   K 4.7 12/03/2024   CL 105 12/03/2024   CALCIUM  9.5 12/03/2024   MG 2.1 07/30/2024    Bone Lab Results  Component Value Date   VD25OH 18 (L) 10/27/2024   25OHVITD1 20 (L) 07/30/2024   25OHVITD2 <1.0 07/30/2024   25OHVITD3 20 07/30/2024    Inflammation (CRP: Acute Phase) (ESR: Chronic Phase) Lab Results  Component Value Date   CRP 12 (H) 07/30/2024   ESRSEDRATE 44 (H) 07/30/2024   LATICACIDVEN 1.4 08/23/2022         Note: Above Lab results reviewed.  Recent Imaging Review  DG PAIN CLINIC C-ARM 1-60 MIN NO REPORT Fluoro was used, but no Radiologist interpretation will be provided.  Please refer to NOTES tab for provider  progress note. Note: Reviewed        Physical Exam  Vitals: LMP 08/19/2023  BMI: Estimated body mass index is 41.38 kg/m as calculated from the following:   Height as of 12/03/24: 5' 3 (1.6 m).   Weight as of 12/03/24: 233 lb 9.6 oz (106 kg). Ideal: Patient weight not recorded General appearance: Well nourished, well developed, and well hydrated. In no apparent acute distress Mental status: Alert, oriented x 3 (person, place, & time)       Respiratory: No evidence of  acute respiratory distress Eyes: PERLA   Assessment   Diagnosis Status  1. Chronic neck and back pain   2. Cervical neck pain with evidence of disc disease   3. Cervical radiculitis (Right)   4. Postop check    Controlled Controlled Controlled   Updated Problems: No problems updated.  Plan of Care  Problem-specific:  Assessment and Plan            Ms. CHONDA BANEY has a current medication list which includes the following long-term medication(s): albuterol , albuterol , duloxetine , dupixent , eszopiclone, ezetimibe , furosemide, linzess , metformin , montelukast , pantoprazole , propranolol , propranolol  er, rosuvastatin , simethicone, spiriva  handihaler, sumatriptan, and symbicort .  Pharmacotherapy (Medications Ordered): No orders of the defined types were placed in this encounter.  Orders:  No orders of the defined types were placed in this encounter.    Interventional Therapies  Risk Factors  Considerations  Medical Comorbidities:  (07/30/2024) UDS (+) carboxy-THC  MO (BMI>40)  COPD on O2 @ 3 L/min  Smoker  GERD  IBS  OSA  GAD  T2NIDDM  HTN     Planned  Pending:   Diagnostic/therapeutic right cervical ESI #3    Under consideration:   Diagnostic/therapeutic right cervical ESI #3    Completed: (Analgesic benefit)1  Diagnostic/therapeutic right cervical ESI x2 (10/09/2024) (1st:100/50/0/0) (2nd:100/100/80-100/RUE:100Neck:40)   Therapeutic  Palliative (PRN) options:   None established    Completed by other providers:   Therapeutic right greater trochanteric bursa inj. x1 by Debby Amber, PA (11/30/2023) [facility])  Therapeutic left greater trochanteric bursa inj. x1 by Debby Amber, PA (03/26/2024) ([facility])   1(Analgesic benefit): Expressed in percentage (%). (Local anesthetic[LA] +/- sedation  L.A.Local Anesthetic  Steroid benefit  Ongoing benefit)     No follow-ups on file.    Recent Visits Date Type Provider Dept  11/06/24 Procedure visit Tanya Glisson, MD [facility]  10/29/24 Office Visit Tanya Glisson, MD [facility]  10/09/24 Procedure visit Tanya Glisson, MD [facility]  Showing recent visits within past 90 days and meeting all other requirements Future Appointments Date Type Provider Dept  01/05/25 Appointment Tanya Glisson, MD [facility]  Showing future appointments within next 90 days and meeting all other requirements  I discussed the assessment and treatment plan with the patient. The patient was provided an opportunity to ask questions and all were answered. The patient agreed with the plan and demonstrated an understanding of the instructions.  Patient advised to call back or seek an in-person evaluation if the symptoms or condition worsens.  Duration of encounter: *** minutes.  Total time on encounter, as per AMA guidelines included both the face-to-face and non-face-to-face time personally spent by the physician and/or other qualified health care professional(s) on the day of the encounter (includes time in activities that require the physician or other qualified health care professional and does not include time in activities normally performed by clinical staff). Physician's time may include the following activities when performed: Preparing to see the patient (e.g., pre-charting review of records, searching for previously ordered imaging, lab work, and nerve conduction  tests) Review of prior analgesic pharmacotherapies. Reviewing PMP Interpreting ordered tests (e.g., lab work, imaging, nerve conduction tests) Performing post-procedure evaluations, including interpretation of diagnostic procedures Obtaining and/or reviewing separately obtained history Performing a medically appropriate examination and/or evaluation Counseling and educating the patient/family/caregiver Ordering medications, tests, or procedures Referring and communicating with other health care professionals (when not separately reported) Documenting clinical information in the electronic or  other health record Independently interpreting results (not separately reported) and communicating results to the patient/ family/caregiver Care coordination (not separately reported)  Note by: Lindsay DELENA Como, MD (TTS and AI technology used. I apologize for any typographical errors that were not detected and corrected.) Date: 01/05/2025; Time: 7:17 PM

## 2025-01-05 ENCOUNTER — Ambulatory Visit: Admitting: Pain Medicine

## 2025-01-05 ENCOUNTER — Ambulatory Visit: Admitting: Urology

## 2025-01-05 ENCOUNTER — Other Ambulatory Visit: Payer: Self-pay

## 2025-01-05 DIAGNOSIS — Z91199 Patient's noncompliance with other medical treatment and regimen due to unspecified reason: Secondary | ICD-10-CM

## 2025-01-05 DIAGNOSIS — M5090 Cervical disc disorder, unspecified, unspecified cervical region: Secondary | ICD-10-CM

## 2025-01-05 DIAGNOSIS — M5412 Radiculopathy, cervical region: Secondary | ICD-10-CM

## 2025-01-05 DIAGNOSIS — G8929 Other chronic pain: Secondary | ICD-10-CM

## 2025-01-05 DIAGNOSIS — Z09 Encounter for follow-up examination after completed treatment for conditions other than malignant neoplasm: Secondary | ICD-10-CM

## 2025-01-05 NOTE — Patient Instructions (Signed)

## 2025-01-05 NOTE — Progress Notes (Signed)
 Specialty Pharmacy Refill Coordination Note  Lindsay Harris is a 49 y.o. female contacted today regarding refills of specialty medication(s) Dupilumab  (Dupixent )   Patient requested (Patient-Rptd) Delivery   Delivery date: 01/13/25   Verified address: (Patient-Rptd) [address] [place] [address]   Medication will be filled on: 01/12/25

## 2025-01-07 ENCOUNTER — Other Ambulatory Visit: Payer: Self-pay

## 2025-01-12 ENCOUNTER — Other Ambulatory Visit: Payer: Self-pay

## 2025-01-28 ENCOUNTER — Other Ambulatory Visit

## 2025-01-28 DIAGNOSIS — E119 Type 2 diabetes mellitus without complications: Secondary | ICD-10-CM

## 2025-01-28 DIAGNOSIS — E0590 Thyrotoxicosis, unspecified without thyrotoxic crisis or storm: Secondary | ICD-10-CM

## 2025-01-28 DIAGNOSIS — E1169 Type 2 diabetes mellitus with other specified complication: Secondary | ICD-10-CM

## 2025-01-28 DIAGNOSIS — E559 Vitamin D deficiency, unspecified: Secondary | ICD-10-CM

## 2025-01-28 DIAGNOSIS — E538 Deficiency of other specified B group vitamins: Secondary | ICD-10-CM

## 2025-02-04 ENCOUNTER — Ambulatory Visit: Admitting: Family Medicine

## 2025-02-17 ENCOUNTER — Ambulatory Visit: Admitting: Psychiatry

## 2025-03-02 ENCOUNTER — Ambulatory Visit: Admitting: Urology
